# Patient Record
Sex: Female | Born: 1950 | Race: White | Hispanic: No | Smoking: Never smoker
Health system: Southern US, Community
[De-identification: ages and names within clinical notes are randomized; demographics above are authoritative.]

## PROBLEM LIST (undated history)

## (undated) DIAGNOSIS — M1990 Unspecified osteoarthritis, unspecified site: Secondary | ICD-10-CM

## (undated) DIAGNOSIS — R519 Headache, unspecified: Secondary | ICD-10-CM

## (undated) DIAGNOSIS — F32A Depression, unspecified: Secondary | ICD-10-CM

## (undated) DIAGNOSIS — R51 Headache: Secondary | ICD-10-CM

## (undated) DIAGNOSIS — F329 Major depressive disorder, single episode, unspecified: Secondary | ICD-10-CM

## (undated) HISTORY — PX: ANKLE FUSION: SHX881

## (undated) HISTORY — PX: KNEE ARTHROSCOPY: SUR90

## (undated) HISTORY — PX: TONSILLECTOMY: SUR1361

## (undated) HISTORY — PX: BOWEL RESECTION: SHX1257

## (undated) HISTORY — PX: KNEE SURGERY: SHX244

## (undated) HISTORY — PX: ABDOMINAL HYSTERECTOMY: SHX81

---

## 2005-10-13 ENCOUNTER — Encounter: Admission: RE | Admit: 2005-10-13 | Discharge: 2005-10-13 | Payer: Self-pay | Admitting: Obstetrics and Gynecology

## 2006-10-20 ENCOUNTER — Encounter: Admission: RE | Admit: 2006-10-20 | Discharge: 2006-10-20 | Payer: Self-pay | Admitting: Obstetrics and Gynecology

## 2008-01-31 ENCOUNTER — Encounter: Admission: RE | Admit: 2008-01-31 | Discharge: 2008-01-31 | Payer: Self-pay | Admitting: Obstetrics and Gynecology

## 2009-01-17 ENCOUNTER — Emergency Department (HOSPITAL_BASED_OUTPATIENT_CLINIC_OR_DEPARTMENT_OTHER): Admission: EM | Admit: 2009-01-17 | Discharge: 2009-01-17 | Payer: Self-pay | Admitting: Emergency Medicine

## 2009-01-17 ENCOUNTER — Ambulatory Visit: Payer: Self-pay | Admitting: Radiology

## 2009-03-26 ENCOUNTER — Ambulatory Visit: Payer: Self-pay | Admitting: Diagnostic Radiology

## 2009-03-26 ENCOUNTER — Ambulatory Visit (HOSPITAL_BASED_OUTPATIENT_CLINIC_OR_DEPARTMENT_OTHER): Admission: RE | Admit: 2009-03-26 | Discharge: 2009-03-26 | Payer: Self-pay | Admitting: Orthopedic Surgery

## 2009-04-24 ENCOUNTER — Ambulatory Visit (HOSPITAL_BASED_OUTPATIENT_CLINIC_OR_DEPARTMENT_OTHER): Admission: RE | Admit: 2009-04-24 | Discharge: 2009-04-24 | Payer: Self-pay | Admitting: Orthopedic Surgery

## 2009-05-05 ENCOUNTER — Encounter: Admission: RE | Admit: 2009-05-05 | Discharge: 2009-06-25 | Payer: Self-pay | Admitting: Orthopedic Surgery

## 2009-09-30 ENCOUNTER — Encounter: Admission: RE | Admit: 2009-09-30 | Discharge: 2009-09-30 | Payer: Self-pay | Admitting: Obstetrics and Gynecology

## 2009-12-06 ENCOUNTER — Ambulatory Visit: Payer: Self-pay | Admitting: Diagnostic Radiology

## 2009-12-06 ENCOUNTER — Ambulatory Visit (HOSPITAL_BASED_OUTPATIENT_CLINIC_OR_DEPARTMENT_OTHER): Admission: RE | Admit: 2009-12-06 | Discharge: 2009-12-06 | Payer: Self-pay | Admitting: Orthopaedic Surgery

## 2010-01-31 ENCOUNTER — Ambulatory Visit: Payer: Self-pay | Admitting: Diagnostic Radiology

## 2010-01-31 ENCOUNTER — Ambulatory Visit (HOSPITAL_BASED_OUTPATIENT_CLINIC_OR_DEPARTMENT_OTHER): Admission: RE | Admit: 2010-01-31 | Discharge: 2010-01-31 | Payer: Self-pay | Admitting: Orthopaedic Surgery

## 2010-02-16 ENCOUNTER — Encounter: Admission: RE | Admit: 2010-02-16 | Discharge: 2010-03-13 | Payer: Self-pay | Admitting: Orthopaedic Surgery

## 2010-03-04 ENCOUNTER — Encounter: Admission: RE | Admit: 2010-03-04 | Discharge: 2010-03-04 | Payer: Self-pay | Admitting: Orthopedic Surgery

## 2010-03-20 ENCOUNTER — Ambulatory Visit (HOSPITAL_BASED_OUTPATIENT_CLINIC_OR_DEPARTMENT_OTHER): Admission: RE | Admit: 2010-03-20 | Discharge: 2010-03-20 | Payer: Self-pay | Admitting: Orthopedic Surgery

## 2010-06-08 ENCOUNTER — Encounter
Admission: RE | Admit: 2010-06-08 | Discharge: 2010-07-02 | Payer: Self-pay | Source: Home / Self Care | Attending: Orthopedic Surgery | Admitting: Orthopedic Surgery

## 2010-07-07 ENCOUNTER — Encounter: Admission: RE | Admit: 2010-07-07 | Payer: Self-pay | Source: Home / Self Care | Admitting: Orthopedic Surgery

## 2010-07-09 ENCOUNTER — Encounter: Admit: 2010-07-09 | Payer: Self-pay | Admitting: Orthopedic Surgery

## 2010-09-14 ENCOUNTER — Other Ambulatory Visit: Payer: Self-pay | Admitting: Obstetrics and Gynecology

## 2010-09-17 LAB — POCT HEMOGLOBIN-HEMACUE: Hemoglobin: 13.1 g/dL (ref 12.0–15.0)

## 2010-10-11 LAB — POCT CARDIAC MARKERS: CKMB, poc: <1 ng/mL — ABNORMAL LOW (ref 1.0–8.0)

## 2010-12-27 ENCOUNTER — Emergency Department (INDEPENDENT_AMBULATORY_CARE_PROVIDER_SITE_OTHER): Payer: BC Managed Care – PPO

## 2010-12-27 ENCOUNTER — Emergency Department (HOSPITAL_BASED_OUTPATIENT_CLINIC_OR_DEPARTMENT_OTHER)
Admission: EM | Admit: 2010-12-27 | Discharge: 2010-12-27 | Disposition: A | Discharge status: Home / Self Care | Payer: BC Managed Care – PPO | Attending: Emergency Medicine | Admitting: Emergency Medicine

## 2010-12-27 DIAGNOSIS — R609 Edema, unspecified: Secondary | ICD-10-CM

## 2010-12-27 DIAGNOSIS — W19XXXA Unspecified fall, initial encounter: Secondary | ICD-10-CM | POA: Insufficient documentation

## 2010-12-27 DIAGNOSIS — M25579 Pain in unspecified ankle and joints of unspecified foot: Secondary | ICD-10-CM

## 2010-12-27 DIAGNOSIS — Y9289 Other specified places as the place of occurrence of the external cause: Secondary | ICD-10-CM | POA: Insufficient documentation

## 2010-12-27 DIAGNOSIS — S82899A Other fracture of unspecified lower leg, initial encounter for closed fracture: Secondary | ICD-10-CM | POA: Insufficient documentation

## 2011-02-12 ENCOUNTER — Emergency Department (HOSPITAL_BASED_OUTPATIENT_CLINIC_OR_DEPARTMENT_OTHER)
Admission: EM | Admit: 2011-02-12 | Discharge: 2011-02-12 | Disposition: A | Discharge status: Home / Self Care | Payer: BC Managed Care – PPO | Attending: Emergency Medicine | Admitting: Emergency Medicine

## 2011-02-12 DIAGNOSIS — L989 Disorder of the skin and subcutaneous tissue, unspecified: Secondary | ICD-10-CM

## 2011-02-12 DIAGNOSIS — R51 Headache: Secondary | ICD-10-CM | POA: Insufficient documentation

## 2011-02-12 MED ORDER — SULFAMETHOXAZOLE-TRIMETHOPRIM 800-160 MG PO TABS: 1.0000 | ORAL_TABLET | Freq: Two times a day (BID) | ORAL | Status: AC

## 2011-02-12 MED ORDER — HYDROCODONE-ACETAMINOPHEN 7.5-325 MG PO TABS: 1.0000 | ORAL_TABLET | Freq: Four times a day (QID) | ORAL | Status: AC | PRN

## 2011-02-12 NOTE — ED Provider Notes (Signed)
History     CSN: [id number] Arrival date & time: 02/12/2011  1:53 PM  Chief Complaint  Patient presents with  . Headache    right forehead pain with swelling and knot on head- tender to touch   HPI Comments: Pt has noted a "bump" to the right frontal scalp area. She does not recall an insect bite, or bumping into any thing. She is frighten because the area is painful and her husband has been diagnosed with leukemia. She request evaluation.  Patient is a 60 y.o. female presenting with headaches. The history is provided by the patient.  Headache  This is a new problem. The current episode started more than 2 days ago. The problem occurs constantly. The problem has not changed since onset.Associated with: Pain at the site. The pain is located in the frontal region. The quality of the pain is described as sharp. The pain is moderate. The pain does not radiate. Pertinent negatives include no anorexia, no fever, no malaise/fatigue, no near-syncope, no palpitations, no syncope, no shortness of breath, no nausea and no vomiting. She has tried nothing for the symptoms.    History reviewed. No pertinent past medical history.  Past Surgical History  Procedure Date  . Abdominal hysterectomy   . Bowel resection   . Tonsillectomy   . Ankle fusion     History reviewed. No pertinent family history.  History  Substance Use Topics  . Smoking status: Never Smoker   . Smokeless tobacco: Not on file  . Alcohol Use: 0.6 oz/week    1 Glasses of wine per week     once week    OB History    Grav Para Term Preterm Abortions TAB SAB Ect Mult Living                  Review of Systems  Constitutional: Negative for fever, malaise/fatigue and activity change.       All ROS Neg except as noted in HPI  HENT: Negative for nosebleeds and neck pain.   Eyes: Negative for photophobia and discharge.  Respiratory: Negative for cough, shortness of breath and wheezing.   Cardiovascular: Negative for chest  pain, palpitations, syncope and near-syncope.  Gastrointestinal: Negative for nausea, vomiting, abdominal pain, blood in stool and anorexia.  Genitourinary: Negative for dysuria, frequency and hematuria.  Musculoskeletal: Negative for back pain and arthralgias.  Skin: Negative.   Neurological: Positive for headaches. Negative for dizziness, seizures and speech difficulty.  Psychiatric/Behavioral: Negative for hallucinations and confusion.    Physical Exam  BP 121/72  Pulse 86  Temp(Src) 98.9 F (37.2 C) (Oral)  Resp 16  SpO2 100%  Physical Exam  Nursing note and vitals reviewed. Constitutional: She is oriented to person, place, and time. She appears well-developed and well-nourished.  Non-toxic appearance.  HENT:  Head: Normocephalic.  Right Ear: Tympanic membrane and external ear normal.  Left Ear: Tympanic membrane and external ear normal.       1.2cm lesion that is mildly raised and tender to palpation. No red streaks. Not hot.   Eyes: EOM and lids are normal. Pupils are equal, round, and reactive to light.  Neck: Normal range of motion. Neck supple. Carotid bruit is not present.  Cardiovascular: Normal rate, regular rhythm, normal heart sounds, intact distal pulses and normal pulses.   Pulmonary/Chest: Breath sounds normal. No respiratory distress.  Abdominal: Soft. Bowel sounds are normal. There is no tenderness. There is no guarding.  Musculoskeletal: Normal range of motion.  Lymphadenopathy:  Head (right side): No submandibular adenopathy present.       Head (left side): No submandibular adenopathy present.    She has no cervical adenopathy.  Neurological: She is alert and oriented to person, place, and time. She has normal strength. No cranial nerve deficit or sensory deficit. Coordination normal.  Skin: Skin is warm and dry.  Psychiatric: She has a normal mood and affect. Her speech is normal.    ED Course  Procedures  MDM I have reviewed nursing notes, vital  signs, and all appropriate lab and imaging results for this patient.      Kathie Dike, [place] 02/12/11 [phone]

## 2011-02-12 NOTE — ED Notes (Signed)
No visual problems; headache radiates down to right eye; no syncope or trauma to head

## 2011-02-14 NOTE — ED Provider Notes (Signed)
Medical screening examination/treatment/procedure(s) were performed by non-physician practitioner and as supervising physician I was immediately available for consultation/collaboration.   Nelia Shi, MD 02/14/11 2200

## 2011-04-05 ENCOUNTER — Other Ambulatory Visit: Payer: Self-pay | Admitting: Orthopedic Surgery

## 2011-04-05 DIAGNOSIS — M25512 Pain in left shoulder: Secondary | ICD-10-CM

## 2011-04-09 ENCOUNTER — Ambulatory Visit
Admission: RE | Admit: 2011-04-09 | Discharge: 2011-04-09 | Disposition: A | Discharge status: Home / Self Care | Payer: BC Managed Care – PPO | Source: Ambulatory Visit | Attending: Orthopedic Surgery | Admitting: Orthopedic Surgery

## 2011-04-09 DIAGNOSIS — M25512 Pain in left shoulder: Secondary | ICD-10-CM

## 2011-04-26 ENCOUNTER — Ambulatory Visit: Payer: BC Managed Care – PPO | Attending: Orthopedic Surgery | Admitting: Physical Therapy

## 2011-04-26 DIAGNOSIS — M25519 Pain in unspecified shoulder: Secondary | ICD-10-CM | POA: Insufficient documentation

## 2011-04-26 DIAGNOSIS — M25619 Stiffness of unspecified shoulder, not elsewhere classified: Secondary | ICD-10-CM | POA: Insufficient documentation

## 2011-04-26 DIAGNOSIS — IMO0001 Reserved for inherently not codable concepts without codable children: Secondary | ICD-10-CM | POA: Insufficient documentation

## 2011-04-29 ENCOUNTER — Ambulatory Visit: Payer: BC Managed Care – PPO | Admitting: Physical Therapy

## 2011-05-04 ENCOUNTER — Ambulatory Visit: Payer: BC Managed Care – PPO | Admitting: Physical Therapy

## 2011-05-06 ENCOUNTER — Ambulatory Visit: Payer: BC Managed Care – PPO | Attending: Orthopedic Surgery | Admitting: Physical Therapy

## 2011-05-06 DIAGNOSIS — IMO0001 Reserved for inherently not codable concepts without codable children: Secondary | ICD-10-CM | POA: Insufficient documentation

## 2011-05-06 DIAGNOSIS — M25619 Stiffness of unspecified shoulder, not elsewhere classified: Secondary | ICD-10-CM | POA: Insufficient documentation

## 2011-05-06 DIAGNOSIS — M25519 Pain in unspecified shoulder: Secondary | ICD-10-CM | POA: Insufficient documentation

## 2011-05-11 ENCOUNTER — Ambulatory Visit: Payer: BC Managed Care – PPO | Admitting: Physical Therapy

## 2011-06-01 ENCOUNTER — Ambulatory Visit: Payer: BC Managed Care – PPO | Admitting: Physical Therapy

## 2011-06-03 ENCOUNTER — Ambulatory Visit: Payer: BC Managed Care – PPO | Admitting: Physical Therapy

## 2011-06-08 ENCOUNTER — Ambulatory Visit: Payer: BC Managed Care – PPO | Attending: Orthopedic Surgery | Admitting: Physical Therapy

## 2011-06-08 DIAGNOSIS — IMO0001 Reserved for inherently not codable concepts without codable children: Secondary | ICD-10-CM | POA: Insufficient documentation

## 2011-06-08 DIAGNOSIS — M25619 Stiffness of unspecified shoulder, not elsewhere classified: Secondary | ICD-10-CM | POA: Insufficient documentation

## 2011-06-08 DIAGNOSIS — M25519 Pain in unspecified shoulder: Secondary | ICD-10-CM | POA: Insufficient documentation

## 2011-06-10 ENCOUNTER — Ambulatory Visit: Payer: BC Managed Care – PPO | Admitting: Physical Therapy

## 2011-06-15 ENCOUNTER — Ambulatory Visit: Payer: BC Managed Care – PPO | Admitting: Physical Therapy

## 2011-06-22 ENCOUNTER — Ambulatory Visit: Payer: BC Managed Care – PPO | Admitting: Physical Therapy

## 2011-07-21 ENCOUNTER — Emergency Department (INDEPENDENT_AMBULATORY_CARE_PROVIDER_SITE_OTHER): Payer: BC Managed Care – PPO

## 2011-07-21 ENCOUNTER — Encounter (HOSPITAL_BASED_OUTPATIENT_CLINIC_OR_DEPARTMENT_OTHER): Payer: Self-pay | Admitting: *Deleted

## 2011-07-21 ENCOUNTER — Emergency Department (HOSPITAL_BASED_OUTPATIENT_CLINIC_OR_DEPARTMENT_OTHER)
Admission: EM | Admit: 2011-07-21 | Discharge: 2011-07-21 | Disposition: A | Discharge status: Home / Self Care | Payer: BC Managed Care – PPO | Attending: Emergency Medicine | Admitting: Emergency Medicine

## 2011-07-21 ENCOUNTER — Other Ambulatory Visit: Payer: Self-pay

## 2011-07-21 DIAGNOSIS — R42 Dizziness and giddiness: Secondary | ICD-10-CM | POA: Insufficient documentation

## 2011-07-21 LAB — COMPREHENSIVE METABOLIC PANEL
ALT: 16 U/L (ref 0–35)
AST: 21 U/L (ref 0–37)
Albumin: 4.1 g/dL (ref 3.5–5.2)
Alkaline Phosphatase: 90 U/L (ref 39–117)
BUN: 17 mg/dL (ref 6–23)
CO2: 25 mEq/L (ref 19–32)
Calcium: 9.7 mg/dL (ref 8.4–10.5)
Chloride: 103 mEq/L (ref 96–112)
Creatinine, Ser: 0.8 mg/dL (ref 0.50–1.10)
GFR calc Af Amer: >90 mL/min (ref >90)
GFR calc non Af Amer: 79 mL/min — ABNORMAL LOW (ref >90)
Glucose, Bld: 114 mg/dL — ABNORMAL HIGH (ref 70–99)
Potassium: 4.4 mEq/L (ref 3.5–5.1)
Sodium: 139 mEq/L (ref 135–145)
Total Bilirubin: 0.2 mg/dL — ABNORMAL LOW (ref 0.3–1.2)
Total Protein: 7.2 g/dL (ref 6.0–8.3)

## 2011-07-21 LAB — PROTIME-INR
INR: 0.91 (ref 0.00–1.49)
Prothrombin Time: 12.5 seconds (ref 11.6–15.2)

## 2011-07-21 LAB — CBC
HCT: 40.8 % (ref 36.0–46.0)
Hemoglobin: 13.8 g/dL (ref 12.0–15.0)
MCH: 30.5 pg (ref 26.0–34.0)
MCHC: 33.8 g/dL (ref 30.0–36.0)
MCV: 90.1 fL (ref 78.0–100.0)
Platelets: 252 10*3/uL (ref 150–400)
RBC: 4.53 MIL/uL (ref 3.87–5.11)
RDW: 13 % (ref 11.5–15.5)
WBC: 8.3 10*3/uL (ref 4.0–10.5)

## 2011-07-21 LAB — URINALYSIS, ROUTINE W REFLEX MICROSCOPIC
Bilirubin Urine: NEGATIVE
Glucose, UA: NEGATIVE mg/dL
Protein, ur: NEGATIVE mg/dL

## 2011-07-21 LAB — DIFFERENTIAL
Basophils Absolute: 0 10*3/uL (ref 0.0–0.1)
Basophils Relative: 0 % (ref 0–1)
Monocytes Relative: 9 % (ref 3–12)
Neutro Abs: 4.8 10*3/uL (ref 1.7–7.7)
Neutrophils Relative %: 59 % (ref 43–77)

## 2011-07-21 LAB — CARDIAC PANEL(CRET KIN+CKTOT+MB+TROPI)
CK, MB: 1.7 ng/mL (ref 0.3–4.0)
Relative Index: INVALID (ref 0.0–2.5)
Total CK: 73 U/L (ref 7–177)
Troponin I: <0.3 ng/mL (ref <0.30)

## 2011-07-21 LAB — URINE MICROSCOPIC-ADD ON

## 2011-07-21 MED ORDER — MECLIZINE HCL 50 MG PO TABS: 25.0000 mg | ORAL_TABLET | Freq: Three times a day (TID) | ORAL | Status: AC | PRN

## 2011-07-21 MED ORDER — MECLIZINE HCL 25 MG PO TABS
25.0000 mg | ORAL_TABLET | Freq: Once | ORAL | Status: AC
  Administered 2011-07-21: 25 mg via ORAL
  Filled 2011-07-21: qty 1

## 2011-07-21 NOTE — ED Provider Notes (Signed)
History     CSN: [id number]  Arrival date & time 07/21/11  1613   None     Chief Complaint  Patient presents with  . Dizziness    (Consider location/radiation/quality/duration/timing/severity/associated sxs/prior treatment) HPI Comments: Patient is a 61 year old woman who says she's real dizzy. She says this is the third episode she's had like this in a week. She describes resting her head back on the back of her chair. When she straightened up she felt disoriented and became dizzy, feeling as though she would fall if she walks. Things seem to go round and round. There is no vomiting. She does better she holds still. There is no history of injury. Up until this week she had a similar symptom.  Patient is a 61 y.o. female presenting with neurologic complaint.  Neurologic Problem The primary symptoms include dizziness. Primary symptoms do not include fever. The symptoms began 3 to 5 days ago. Episode duration: Episodes last several minutes and are worse with change of position. The symptoms are unchanged. Context: This episode happened while at work. She notes that she is under considerable stress at work.  Procedure history comments: She's had no prior medical evaluation of her condition.Marland Kitchen    History reviewed. No pertinent past medical history.  Past Surgical History  Procedure Date  . Abdominal hysterectomy   . Bowel resection   . Tonsillectomy   . Ankle fusion     History reviewed. No pertinent family history.  History  Substance Use Topics  . Smoking status: Never Smoker   . Smokeless tobacco: Not on file  . Alcohol Use: 0.6 oz/week    1 Glasses of wine per week     once week    OB History    Grav Para Term Preterm Abortions TAB SAB Ect Mult Living                  Review of Systems  Constitutional: Negative.  Negative for fever and chills.  HENT: Negative.   Eyes: Negative.   Respiratory: Negative.   Cardiovascular: Negative.   Gastrointestinal: Negative.     Genitourinary: Negative.   Musculoskeletal: Negative.   Skin: Negative.   Neurological: Positive for dizziness.  Psychiatric/Behavioral: Negative.     Allergies  Penicillins; Codeine; and Darvon  Home Medications   Current Outpatient Rx  Name Route Sig Dispense Refill  . NAPROXEN SODIUM 220 MG PO TABS Oral Take 220 mg by mouth 2 (two) times daily with a meal.        BP 126/80  Pulse 85  Temp(Src) 98 F (36.7 C) (Oral)  Resp 18  Ht 5\' 6"  (1.676 m)  Wt 210 lb (95.255 kg)  BMI 33.89 kg/m2  SpO2 100%  Physical Exam  Constitutional: She is oriented to person, place, and time.       Obese woman in no distress at rest. Sitting her up accentuated her feeling of dizziness.  HENT:  Head: Normocephalic and atraumatic.  Right Ear: External ear normal.  Left Ear: External ear normal.  Nose: Nose normal.  Mouth/Throat: Oropharynx is clear and moist.       No nystagmus on lateral gaze to either side.  Eyes: Conjunctivae and EOM are normal. Pupils are equal, round, and reactive to light.  Neck: Normal range of motion. Neck supple.       No carotid bruits.  Cardiovascular: Normal rate, regular rhythm and normal heart sounds.   Pulmonary/Chest: Effort normal and breath sounds normal.  Abdominal: Soft.  Bowel sounds are normal.  Musculoskeletal: Normal range of motion.  Neurological: She is alert and oriented to person, place, and time.       No sensory or motor deficit.  Skin: Skin is warm and dry.  Psychiatric: She has a normal mood and affect. Her behavior is normal.    ED Course  Procedures (including critical care time)   Labs Reviewed  CARDIAC PANEL(CRET KIN+CKTOT+MB+TROPI)  CBC  COMPREHENSIVE METABOLIC PANEL   No results found. 4:29 PM  Date: 07/21/2011  Rate:77  Rhythm: normal sinus rhythm  QRS Axis: normal  Intervals: normal  ST/T Wave abnormalities: normal  Conduction Disutrbances:none  Narrative Interpretation: Normal EKG  Old EKG Reviewed:  unchanged  5:08 PM Patient was seen and had physical exam. Laboratory tests were ordered. EKG and head CT were ordered. Antivert 25 mg by mouth was ordered.   7:30 PM Lab workup was reassuringly negative. Patient is starting to feel better. I advised her to take Antivert 25 mg 4 times a day for 7 days for dizziness no work for 3 days. She can follow up with Raechel Chute, M.D.  upstairs from [facility] ED.   1. Vertigo           Carleene Cooper III, MD 07/21/11 [phone]

## 2011-07-21 NOTE — ED Notes (Signed)
Pt to room 2 by ems via stretcher, pt is a/a/ox4, reports dizzy spells x 3 days, today stood up at work and felt dizzy so called 911. Pt states she has been under a lot of stress at work lately, and feels this is related. Pt denies any cp, sob or any other c/o.

## 2013-02-07 ENCOUNTER — Other Ambulatory Visit: Payer: Self-pay | Admitting: Orthopedic Surgery

## 2013-02-07 DIAGNOSIS — M25572 Pain in left ankle and joints of left foot: Secondary | ICD-10-CM

## 2013-02-08 ENCOUNTER — Ambulatory Visit
Admission: RE | Admit: 2013-02-08 | Discharge: 2013-02-08 | Disposition: A | Discharge status: Home / Self Care | Payer: BC Managed Care – PPO | Source: Ambulatory Visit | Attending: Orthopedic Surgery | Admitting: Orthopedic Surgery

## 2013-02-08 DIAGNOSIS — M25572 Pain in left ankle and joints of left foot: Secondary | ICD-10-CM

## 2013-08-11 ENCOUNTER — Encounter (HOSPITAL_BASED_OUTPATIENT_CLINIC_OR_DEPARTMENT_OTHER): Payer: Self-pay | Admitting: Emergency Medicine

## 2013-08-11 ENCOUNTER — Emergency Department (HOSPITAL_BASED_OUTPATIENT_CLINIC_OR_DEPARTMENT_OTHER): Payer: BC Managed Care – PPO

## 2013-08-11 ENCOUNTER — Emergency Department (HOSPITAL_BASED_OUTPATIENT_CLINIC_OR_DEPARTMENT_OTHER)
Admission: EM | Admit: 2013-08-11 | Discharge: 2013-08-11 | Disposition: A | Discharge status: Home / Self Care | Payer: BC Managed Care – PPO | Attending: Emergency Medicine | Admitting: Emergency Medicine

## 2013-08-11 DIAGNOSIS — X500XXA Overexertion from strenuous movement or load, initial encounter: Secondary | ICD-10-CM | POA: Insufficient documentation

## 2013-08-11 DIAGNOSIS — M67919 Unspecified disorder of synovium and tendon, unspecified shoulder: Secondary | ICD-10-CM | POA: Insufficient documentation

## 2013-08-11 DIAGNOSIS — S46909A Unspecified injury of unspecified muscle, fascia and tendon at shoulder and upper arm level, unspecified arm, initial encounter: Secondary | ICD-10-CM | POA: Insufficient documentation

## 2013-08-11 DIAGNOSIS — M719 Bursopathy, unspecified: Secondary | ICD-10-CM | POA: Insufficient documentation

## 2013-08-11 DIAGNOSIS — Y9389 Activity, other specified: Secondary | ICD-10-CM | POA: Insufficient documentation

## 2013-08-11 DIAGNOSIS — Z880 Allergy status to penicillin: Secondary | ICD-10-CM | POA: Insufficient documentation

## 2013-08-11 DIAGNOSIS — Z79899 Other long term (current) drug therapy: Secondary | ICD-10-CM | POA: Insufficient documentation

## 2013-08-11 DIAGNOSIS — M25511 Pain in right shoulder: Secondary | ICD-10-CM

## 2013-08-11 DIAGNOSIS — S4980XA Other specified injuries of shoulder and upper arm, unspecified arm, initial encounter: Secondary | ICD-10-CM | POA: Insufficient documentation

## 2013-08-11 DIAGNOSIS — M7580 Other shoulder lesions, unspecified shoulder: Secondary | ICD-10-CM

## 2013-08-11 DIAGNOSIS — Y929 Unspecified place or not applicable: Secondary | ICD-10-CM | POA: Insufficient documentation

## 2013-08-11 MED ORDER — HYDROCODONE-ACETAMINOPHEN 5-325 MG PO TABS: 2.0000 | ORAL_TABLET | ORAL | Status: DC | PRN

## 2013-08-11 NOTE — ED Provider Notes (Signed)
CSN: [id number]     Arrival date & time 08/11/13  1028 History   First MD Initiated Contact with Patient 08/11/13 1048     Chief Complaint  Patient presents with  . Shoulder Pain   (Consider location/radiation/quality/duration/timing/severity/associated sxs/prior Treatment) HPI Comments: Patient is a 63 year old female who presents with complaints of right shoulder pain. This started approximately 6 weeks ago while she was reaching for objects. She felt a pop in her shoulder and it has been uncomfortable since. There is no numbness or tingling. Her pain is worsened when she tends to reach behind her or put her hands in her pockets.  Patient is a 63 y.o. female presenting with shoulder pain. The history is provided by the patient.  Shoulder Pain This is a new problem. Episode onset: 6 weeks ago. The problem occurs constantly. The problem has been gradually worsening. Exacerbated by: Movement and palpation. Nothing relieves the symptoms. She has tried nothing for the symptoms. The treatment provided no relief.    History reviewed. No pertinent past medical history. Past Surgical History  Procedure Laterality Date  . Abdominal hysterectomy    . Bowel resection    . Tonsillectomy    . Ankle fusion    . Knee surgery Bilateral    No family history on file. History  Substance Use Topics  . Smoking status: Never Smoker   . Smokeless tobacco: Not on file  . Alcohol Use: 0.6 oz/week    1 Glasses of wine per week     Comment: once week   OB History   Grav Para Term Preterm Abortions TAB SAB Ect Mult Living                 Review of Systems  All other systems reviewed and are negative.    Allergies  Penicillins; Codeine; and Darvon  Home Medications   Current Outpatient Rx  Name  Route  Sig  Dispense  Refill  . magnesium oxide (MAG-OX) 400 MG tablet   Oral   Take 400 mg by mouth daily.         Marland Kitchen. OVER THE COUNTER MEDICATION   Oral   Take 1 capsule by mouth daily.  Safferon          BP 150/95  Pulse 76  Temp(Src) 97.7 F (36.5 C)  Resp 18  SpO2 100% Physical Exam  Nursing note and vitals reviewed. Constitutional: She is oriented to person, place, and time. She appears well-developed and well-nourished. No distress.  HENT:  Head: Normocephalic and atraumatic.  Neck: Normal range of motion. Neck supple.  Musculoskeletal:  Right shoulder appears grossly normal. There is tenderness to palpation over the posterior aspect. The ulnar and radial pulses are easily palpable and motor and sensation are intact in the hand. There is pain with abduction.  Neurological: She is alert and oriented to person, place, and time.  Skin: Skin is warm and dry. She is not diaphoretic.    ED Course  Procedures (including critical care time) Labs Review Labs Reviewed - No data to display Imaging Review No results found.    MDM  No diagnosis found. Patient presents with complaints of shoulder pain. Her x-rays are negative and I suspect this may be a rotator cuff tendinitis. She will be placed in an arm sling and advised to, take anti-inflammatories, and followup with her Dr. if not improving in the next week to discuss physical therapy or further imaging.    Geoffery Lyonsouglas Raeanne Deschler, MD 08/11/13 1124

## 2013-08-11 NOTE — Discharge Instructions (Signed)
Ibuprofen 800 mg 3 times daily for the next 5 days.  Hydrocodone as needed for pain not relieved with ibuprofen.  Wear sling for the next week and rest.  If not improving in the next week, followup with your primary Dr. to discuss physical therapy or further imaging.   Rotator Cuff Tendinitis  Rotator cuff tendinitis is inflammation of the tough, cord-like bands that connect muscle to bone (tendons) in your rotator cuff. Your rotator cuff is the collection of all the muscles and tendons that connect your arm to your shoulder. Your rotator cuff holds the head of your upper arm bone (humerus) in the cup (fossa) of your shoulder blade (scapula). CAUSES Rotator cuff tendinitis is usually caused by overusing the joint involved.  SIGNS AND SYMPTOMS  Deep ache in the shoulder also felt on the outside upper arm over the shoulder muscle.  Point tenderness over the area that is injured.  Pain comes on gradually and becomes worse with lifting the arm to the side (abduction) or turning it inward (internal rotation).  May lead to a chronic tear: When a rotator cuff tendon becomes inflamed, it runs the risk of losing its blood supply, causing some tendon fibers to die. This increases the risk that the tendon can fray and partially or completely tear. DIAGNOSIS Rotator cuff tendinitis is diagnosed by taking a medical history, performing a physical exam, and reviewing results of imaging exams. The medical history is useful to help determine the type of rotator cuff injury. The physical exam will include looking at the injured shoulder, feeling the injured area, and watching you do range-of-motion exercises. X-ray exams are typically done to rule out other causes of shoulder pain, such as fractures. MRI is the imaging exam usually used for significant shoulder injuries. Sometimes a dye study called CT arthrogram is done, but it is not as widely used as MRI. In some institutions, special ultrasound tests may  also be used to aid in the diagnosis. TREATMENT  Less Severe Cases  Use of a sling to rest the shoulder for a short period of time. Prolonged use of the sling can cause stiffness, weakness, and loss of motion of the shoulder joint.  Anti-inflammatory medicines, such as ibuprofen or naproxen sodium, may be prescribed. More Severe Cases  Physical therapy.  Use of steroid injections into the shoulder joint.  Surgery. HOME CARE INSTRUCTIONS   Use a sling or splint until the pain decreases. Prolonged use of the sling can cause stiffness, weakness, and loss of motion of the shoulder joint.  Apply ice to the injured area:  Put ice in a plastic bag.  Place a towel between your skin and the bag.  Leave the ice on for 20 minutes, 2 3 times a day.  Try to avoid use other than gentle range of motion while your shoulder is painful. Use the shoulder and exercise only as directed by your health care provider. Stop exercises or range of motion if pain or discomfort increases, unless directed otherwise by your health care provider.  Only take over-the-counter or prescription medicines for pain, discomfort, or fever as directed by your health care provider.  If you were given a shoulder sling and straps (immobilizer), do not remove it except as directed, or until you see a health care provider for a follow-up exam. If you need to remove it, move your arm as little as possible or as directed.  You may want to sleep on several pillows at night to lessen swelling  and pain. SEEK IMMEDIATE MEDICAL CARE IF:   Your shoulder pain increases or new pain develops in your arm, hand, or fingers and is not relieved with medicines.  You have new, unexplained symptoms, especially increased numbness in the hands or loss of strength.  You develop any worsening of the problems that brought you in for care.  Your arm, hand, or fingers are numb or tingling.  Your arm, hand, or fingers are swollen, painful, or  turn white or blue. MAKE SURE YOU:  Understand these instructions.  Will watch your condition.  Will get help right away if you are not doing well or get worse. Document Released: 09/11/2003 Document Revised: 04/11/2013 Document Reviewed: 01/31/2013 [facility] Patient Information 2014 [place], [place].

## 2013-08-11 NOTE — ED Notes (Signed)
Patient states she injured her R shoulder approx one month ago, took ibuprofen, but pain has grown worse

## 2014-09-23 ENCOUNTER — Other Ambulatory Visit (HOSPITAL_COMMUNITY): Payer: Self-pay | Admitting: Orthopaedic Surgery

## 2014-09-23 ENCOUNTER — Ambulatory Visit (HOSPITAL_COMMUNITY)
Admission: RE | Admit: 2014-09-23 | Discharge: 2014-09-23 | Disposition: A | Discharge status: Home / Self Care | Payer: BLUE CROSS/BLUE SHIELD | Source: Ambulatory Visit | Attending: Cardiology | Admitting: Cardiology

## 2014-09-23 DIAGNOSIS — M79662 Pain in left lower leg: Secondary | ICD-10-CM | POA: Diagnosis not present

## 2014-09-23 DIAGNOSIS — M7989 Other specified soft tissue disorders: Secondary | ICD-10-CM | POA: Insufficient documentation

## 2014-09-23 NOTE — Progress Notes (Signed)
Left Lower Ext Venous Completed. Negative for DVT or SVT. Eriq Hufford, BS, RDMS, RVT  

## 2014-09-27 ENCOUNTER — Telehealth (HOSPITAL_COMMUNITY): Payer: Self-pay | Admitting: *Deleted

## 2015-01-09 ENCOUNTER — Encounter: Payer: Self-pay | Admitting: Rehabilitation

## 2015-01-09 ENCOUNTER — Ambulatory Visit: Payer: BLUE CROSS/BLUE SHIELD | Attending: Orthopaedic Surgery | Admitting: Rehabilitation

## 2015-01-09 DIAGNOSIS — M25561 Pain in right knee: Secondary | ICD-10-CM

## 2015-01-09 DIAGNOSIS — R262 Difficulty in walking, not elsewhere classified: Secondary | ICD-10-CM | POA: Diagnosis present

## 2015-01-09 DIAGNOSIS — M25562 Pain in left knee: Secondary | ICD-10-CM | POA: Insufficient documentation

## 2015-01-09 NOTE — Therapy (Addendum)
[facility] [address]  [place], [place], [address] Phone: [phone]   Fax:  [phone]  Physical Therapy Evaluation  Patient Details  Name: Lauren Frye MRN: [id number] Date of Birth: 1950-12-28 Referring Provider:  Garald Balding, MD  Encounter Date: 01/09/2015      PT End of Session - 01/09/15 1711    Visit Number 1   Number of Visits 4   Date for PT Re-Evaluation 02/06/15   PT Start Time 3013   PT Stop Time 1700   PT Time Calculation (min) 45 min   Activity Tolerance Patient tolerated treatment well      History reviewed. No pertinent past medical history.  Past Surgical History  Procedure Laterality Date  . Abdominal hysterectomy    . Bowel resection    . Tonsillectomy    . Ankle fusion    . Knee surgery Bilateral     There were no vitals filed for this visit.  Visit Diagnosis:  Knee pain, bilateral - Plan: PT plan of care cert/re-cert  Difficulty walking - Plan: PT plan of care cert/re-cert      Subjective Assessment - 01/09/15 1612    Subjective Presents s/p bilateral knee scope due to lateral meniscal tears due to degeneration. No therapy post operatively.  The pain and difficulties never went away in the L knee.  The Right knee felt better but has now flared up again.  Has had 1 cortisone injection which did not help.  Pain bilaterally now medially and most likely more OA related.  Getting SimVisc injection July 25th in just the L leg. Eventual knee replacement on both knees.   MD wants her to increase her quad strength but reports even the bicycle is very painful.  Reports unable to go down the stairs.  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    Limitations Standing;Walking   Patient Stated Goals improve LE/quad strength, improve stairs, getting into and out of the car, hold off/prepare for knee replacement   Currently in Pain? Yes   Pain Score 4   up to 10/10 everyday   Pain  Location Knee  medial   Pain Orientation Left   Pain Type Chronic pain   Pain Frequency Constant   Aggravating Factors  walking, stairs, sit to stand   Pain Relieving Factors medication, rest, ice   Multiple Pain Sites Yes   Pain Score 2  up to 6-7/10   Pain Location Knee   Pain Orientation Right  medial   Aggravating Factors  walking, stairs, sit to stand   Pain Relieving Factors rest, medication            [facility] PT Assessment - 01/09/15 0001    Assessment   Medical Diagnosis L meniscal repair   Onset Date/Surgical Date 09/12/14   Next MD Visit July 24   Prior Therapy no   Precautions   Precautions None   Restrictions   Weight Bearing Restrictions No   Balance Screen   Has the patient fallen in the past 6 months No   [place] residence   Prior Function   Level of Independence Independent   Vocation Full time employment   Vocation Requirements desk   Observation/Other Assessments   Focus on Therapeutic Outcomes (FOTO)  54% limited   ROM / Strength   AROM / PROM / Strength AROM;PROM;Strength   AROM   AROM Assessment Site Knee  Right/Left Knee Right;Left   Right Knee Extension 0   Right Knee Flexion 105  pn   Left Knee Extension 0  pain with lowering from LAQ    Left Knee Flexion 115  pain   PROM   PROM Assessment Site Knee   Right/Left Knee Right;Left   Strength   Strength Assessment Site Hip;Knee   Right/Left Hip Right;Left   Right Hip Flexion 4+/5   Right Hip External Rotation  --  4+/5   Right Hip Internal Rotation  --  4+/5   Right Hip ABduction 4/5   Right Hip ADduction 4+/5   Left Hip Flexion 4+/5   Left Hip External Rotation  --  4+/5   Left Hip Internal Rotation  --  4+/5   Left Hip ABduction 4/5   Left Hip ADduction 4+/5   Right/Left Knee Right;Left   Right Knee Flexion 4/5   Right Knee Extension 4+/5   Left Knee Flexion 4/5   Left Knee Extension 4+/5  pain with clicking with unlocking    Palpation   Patella mobility WNL   Ambulation/Gait   Gait Comments antalgic pattern L>R      Eval performed HEP given per pt instruction section with green band and instruction to buy 2-3# leg weight Pt will attempt HEP x 1 week                     PT Education - 01/09/15 1710    Education provided Yes   Education Details HEP, POC, aquatic therapy benefits   Person(s) Educated Patient   Methods Explanation;Handout   Comprehension Verbalized understanding;Returned demonstration             PT Long Term Goals - 01/09/15 1716    PT LONG TERM GOAL #1   Title Pt will be independent with HEP for continued LE/quad endurance and conditioning to prolong time to TKR   Time 4   Period Weeks   Status New               Plan - 01/09/15 1712    Clinical Impression Statement pt presents with bilateral knee OA/knee pain with history of 2 debridements of the R knee and 3 of the left.  TKR imminent but wanting to hold off as long as possible.  pt interested in learning HEP, self care.  Overall painful ROM and hip/knee strength good on testing in clinic. Does have painful clicking with unlocking of TKE L.R.  More difficulties with quad inhibition when knee is very painful.  Discussed benefits of looking into aquatic therapy.     Pt will benefit from skilled therapeutic intervention in order to improve on the following deficits Abnormal gait;Decreased activity tolerance;Decreased range of motion;Difficulty walking   Rehab Potential Good   PT Frequency 1x / week   PT Duration 4 weeks   PT Treatment/Interventions Therapeutic exercise;Neuromuscular re-education;Patient/family education;Cryotherapy   PT Next Visit Plan Review HEP; any changes? pt to discuss in clinic sessions vs home independence.     Does not ever want ESTIM   Consulted and Agree with Plan of Care Patient         Problem List There are no active problems to display for this patient.   Stark Bray, DPT, CMP 01/09/2015, 5:20 PM  [facility] [address]  [place], [place], [address] Phone: [phone]   Fax:  [phone]    PHYSICAL THERAPY DISCHARGE SUMMARY  Visits from Start of Care: 1  Current functional level related to goals / functional outcomes:  Refer to above evaluation as patient did not return for any further visits.   Remaining deficits:  See above   Education / Equipment:  Initial HEP provided.  Plan: Patient agrees to discharge.  Patient goals were not met. Patient is being discharged due to not returning since the last visit.  ?????        Percival Spanish, PT, MPT 01/24/2015, 8:19 AM  [facility] [address]  [place], [place], [address] Phone: [phone]   Fax:  [phone]

## 2015-01-20 ENCOUNTER — Ambulatory Visit: Payer: BLUE CROSS/BLUE SHIELD | Admitting: Physical Therapy

## 2015-09-24 ENCOUNTER — Other Ambulatory Visit (HOSPITAL_COMMUNITY): Payer: Self-pay | Admitting: *Deleted

## 2015-09-24 ENCOUNTER — Encounter (HOSPITAL_COMMUNITY)
Admission: RE | Admit: 2015-09-24 | Discharge: 2015-09-24 | Disposition: A | Discharge status: Home / Self Care | Payer: BLUE CROSS/BLUE SHIELD | Source: Ambulatory Visit | Attending: Orthopaedic Surgery | Admitting: Orthopaedic Surgery

## 2015-09-24 ENCOUNTER — Ambulatory Visit (HOSPITAL_COMMUNITY)
Admission: RE | Admit: 2015-09-24 | Discharge: 2015-09-24 | Disposition: A | Discharge status: Home / Self Care | Payer: BLUE CROSS/BLUE SHIELD | Source: Ambulatory Visit | Attending: Orthopedic Surgery | Admitting: Orthopedic Surgery

## 2015-09-24 ENCOUNTER — Encounter (HOSPITAL_COMMUNITY): Payer: Self-pay

## 2015-09-24 DIAGNOSIS — Z01818 Encounter for other preprocedural examination: Secondary | ICD-10-CM

## 2015-09-24 DIAGNOSIS — Z01812 Encounter for preprocedural laboratory examination: Secondary | ICD-10-CM | POA: Insufficient documentation

## 2015-09-24 HISTORY — DX: Unspecified osteoarthritis, unspecified site: M19.90

## 2015-09-24 HISTORY — DX: Major depressive disorder, single episode, unspecified: F32.9

## 2015-09-24 HISTORY — DX: Headache, unspecified: R51.9

## 2015-09-24 HISTORY — DX: Headache: R51

## 2015-09-24 HISTORY — DX: Depression, unspecified: F32.A

## 2015-09-24 LAB — COMPREHENSIVE METABOLIC PANEL
ALT: 25 U/L (ref 14–54)
AST: 21 U/L (ref 15–41)
Albumin: 4 g/dL (ref 3.5–5.0)
Alkaline Phosphatase: 73 U/L (ref 38–126)
Anion gap: 10 (ref 5–15)
BILIRUBIN TOTAL: 0.5 mg/dL (ref 0.3–1.2)
BUN: 17 mg/dL (ref 6–20)
CHLORIDE: 105 mmol/L (ref 101–111)
CO2: 25 mmol/L (ref 22–32)
CREATININE: 0.84 mg/dL (ref 0.44–1.00)
Calcium: 9.7 mg/dL (ref 8.9–10.3)
Glucose, Bld: 93 mg/dL (ref 65–99)
POTASSIUM: 4.4 mmol/L (ref 3.5–5.1)
Sodium: 140 mmol/L (ref 135–145)
TOTAL PROTEIN: 6.7 g/dL (ref 6.5–8.1)

## 2015-09-24 LAB — CBC WITH DIFFERENTIAL/PLATELET
Basophils Absolute: 0 10*3/uL (ref 0.0–0.1)
Basophils Relative: 1 %
EOS PCT: 2 %
Eosinophils Absolute: 0.2 10*3/uL (ref 0.0–0.7)
HEMATOCRIT: 40.6 % (ref 36.0–46.0)
Hemoglobin: 13.5 g/dL (ref 12.0–15.0)
LYMPHS ABS: 2 10*3/uL (ref 0.7–4.0)
LYMPHS PCT: 27 %
MCH: 30.5 pg (ref 26.0–34.0)
MCHC: 33.3 g/dL (ref 30.0–36.0)
MCV: 91.9 fL (ref 78.0–100.0)
MONO ABS: 0.6 10*3/uL (ref 0.1–1.0)
Monocytes Relative: 8 %
Neutro Abs: 4.6 10*3/uL (ref 1.7–7.7)
Neutrophils Relative %: 62 %
PLATELETS: 260 10*3/uL (ref 150–400)
RBC: 4.42 MIL/uL (ref 3.87–5.11)
RDW: 13.3 % (ref 11.5–15.5)
WBC: 7.4 10*3/uL (ref 4.0–10.5)

## 2015-09-24 LAB — SURGICAL PCR SCREEN
MRSA, PCR: NEGATIVE
STAPHYLOCOCCUS AUREUS: NEGATIVE

## 2015-09-24 LAB — URINALYSIS, ROUTINE W REFLEX MICROSCOPIC
Bilirubin Urine: NEGATIVE
GLUCOSE, UA: NEGATIVE mg/dL
Hgb urine dipstick: NEGATIVE
KETONES UR: NEGATIVE mg/dL
LEUKOCYTES UA: NEGATIVE
NITRITE: NEGATIVE
PH: 7 (ref 5.0–8.0)
Protein, ur: NEGATIVE mg/dL
SPECIFIC GRAVITY, URINE: 1.019 (ref 1.005–1.030)

## 2015-09-24 LAB — TYPE AND SCREEN
ABO/RH(D): O POS
Antibody Screen: NEGATIVE

## 2015-09-24 LAB — APTT: aPTT: 31 seconds (ref 24–37)

## 2015-09-24 LAB — PROTIME-INR
INR: 0.9 (ref 0.00–1.49)
PROTHROMBIN TIME: 12.4 s (ref 11.6–15.2)

## 2015-09-24 LAB — ABO/RH: ABO/RH(D): O POS

## 2015-09-24 NOTE — Pre-Procedure Instructions (Signed)
    Lauren AguasSusan Napoli  09/24/2015      [id number] [place] - [address] [place] [id number] Phone: [phone] Fax: [phone]    Your procedure is scheduled on Tuesday, October 07, 2015 at 7:15 AM.   [facility] at 5:30 AM.   Call this number if you have problems the morning of surgery: [phone]   Any questions prior to day of surgery, please call [phone] between 8 & 4 PM.   Remember:  Do not eat food or drink liquids after midnight Monday, 10/06/15.  Stop NSAIDS (Ibuprofen, Aleve, etc.) and Herbal medications 7 days prior to surgery. Do not use any Aspirin products 7 days prior to surgery.    Do not wear jewelry, make-up or nail polish.  Do not wear lotions, powders, or perfumes.  You may wear deodorant.  Do not shave 48 hours prior to surgery.    Do not bring valuables to the hospital.  [facility] is not responsible for any belongings or valuables.  Contacts, dentures or bridgework may not be worn into surgery.  Leave your suitcase in the car.  After surgery it may be brought to your room.  For patients admitted to the hospital, discharge time will be determined by your treatment team.  Special instructions:  See "Preparing for Surgery" Instruction sheet.  Please read over the following fact sheets that you were given. Pain Booklet, Coughing and Deep Breathing, Blood Transfusion Information, MRSA Information and Surgical Site Infection Prevention

## 2015-09-26 LAB — URINE CULTURE

## 2015-09-29 NOTE — Progress Notes (Signed)
UA final results called to April at Dr.Whitfields office.

## 2015-10-01 NOTE — H&P (Signed)
CHIEF COMPLAINT:  Painful left knee.     HISTORY OF PRESENT ILLNESS:  Lauren Frye is a very pleasant 65 year old white female who is seen today for evaluation of her left knee.  She has had problems with this left knee for several years.  She states that both her knees have been painful, but her left one is certainly worse than her right one.  She has had bilateral knee arthroscopies performed in March 2016 and, at that time, she was noted to have partial lateral meniscectomies bilaterally, but there were significant degenerative changes in both of her knees.  She had done well with her right knee, but was still having persistent pain in her left knee in March 2016.  She continued to have catching and locking and popping symptoms and had started viscosupplementation in July 2016.  Her last injection was on February 10, 2015.  She returned back in January of this year and she was having nighttime pain as well as giving way symptoms.  She does have swelling and it does pop at times and also has problems of giving way.  Icing had been somewhat beneficial.  She does have crepitus when she does stairs and with walking.  She has used anti-inflammatories, including Aleve and ibuprofen intermittently, but these had caused gastroesophageal symptoms.  She has pain with every step as well as nighttime pain.  Essentially she has failed conservative treatment.  Seen today for evaluation.     Health history reveals a 65 year old white widowed female.     PAST SURGICAL HISTORY: 1.  In 1970, tonsillectomy. 2.  In 1978, D&C. 3.  In 1981, partial hysterectomy. 4.  In 1984, oophorectomy.  5.  In 1984 for bowel resection secondary to adhesions secondary to her surgeries for her hysterectomy and her oophorectomy.   6.  In 1998 for her left knee surgery, arthroscopic. 7.  In 2011 for ORIF of the ankle.  8.  In 2016 for bilateral knee arthroscopy.   9.  Childbirth, August 11, 1973, and August 19, 1978.     CURRENT  MEDICATIONS: 1.  Aleve p.r.n.  2.  Stress Tabs daily.  3.  Biotin daily.    ALLERGIES:  Codeine and penicillin.    REVIEW OF SYSTEMS:  A 14-point review of systems is positive for easy bruising.  She does have some ankle swelling in the left, which is where she had the open reduction internal fixation performed.  Urinary frequency of every 2-3 hours.  She only urinates once at nighttime.  She has monthly headaches, mainly that of migraines.     FAMILY HISTORY:  A mother who is still alive at age 65.  She has arthritis and has had breast cancer.  Father who is deceased at age 65 from congestive heart failure, COPD, diabetes mellitus and skin cancer.  She has no brothers.  She has 4 sisters, ages 9766, 8765, 3362, 861.     SOCIAL HISTORY:  Lauren Frye is a 65 year old white widowed female.  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  She does not smoke.  She drinks once a week, a glass of wine.     PHYSICAL EXAMINATION:  General:  She is a 65 year old white female, well developed, well nourished, alert, cooperative, in moderate distress secondary to bilateral knee pain, left greater than right.   Height is 5 feet 5-1/2 inches, weight 208, BMI 34.1.  Vital signs reveal a temperature of 98.4, pulse 62, respirations 14, blood pressure 97/70. Head:  Normocephalic.  Eyes:  Pupils equal, round and reactive to light and accommodation with extraocular movements intact.  Ears, nose and throat:  Benign. Neck:  Supple.  No carotid bruits were noted.  Chest:  Good expansion.   Lungs:  Essentially clear. Cardiac:   Regular rhythm and rate.  Normal S1, S2.  No murmurs were noted.     Pulses:  1+ bilateral and symmetric in the lower extremities.   Abdomen:  Obese, soft, nontender.  No masses palpable.  Normal bowel sounds present.   Genitorectal/breast:  Exam not indicated for an orthopedic evaluation.   CNS:  Oriented x3.  Cranial nerves II-XII grossly intact.   Musculoskeletal:  She has range of motion from about 3-4  degrees to 90 degrees.  She has a mild effusion.  Markedly tender to palpation over the joint lines, more medial than lateral.  She does have a mild effusion.  Pseudolaxity with varus and valgus stressing with good endpoints.  Skin:  Intact without ecchymosis or erythema.  No warmth in the joint.  Calves are supple, nontender.  No masses palpable.     CLINICAL IMPRESSION:   1.  End-stage OA of the left knee.   2.  Exogenous obesity.     RECOMMENDATIONS:  At this time, our plan would be to consider a total knee replacement.  I have reviewed a clearance form from Dr. Rennis Harding who feels that she is medically cleared for total joint replacement.  Therefore, our plan would be to proceed with a total knee on the left.  The procedure risks and benefits were fully explained to the patient.  She is understanding.  We have gone over the risks and benefits in detail.  All questions answered.   Oris Drone Aleda Grana [facility] [phone]  10/02/2015 4:47 PM

## 2015-10-06 MED ORDER — TRANEXAMIC ACID 1000 MG/10ML IV SOLN
2000.0000 mg | INTRAVENOUS | Status: AC
  Administered 2015-10-07: 2000 mg via TOPICAL
  Filled 2015-10-06: qty 20

## 2015-10-06 MED ORDER — ACETAMINOPHEN 10 MG/ML IV SOLN: 1000.0000 mg | INTRAVENOUS | Status: DC

## 2015-10-06 MED ORDER — SODIUM CHLORIDE 0.9 % IV SOLN: INTRAVENOUS | Status: DC

## 2015-10-06 MED ORDER — VANCOMYCIN HCL IN DEXTROSE 1-5 GM/200ML-% IV SOLN
1000.0000 mg | INTRAVENOUS | Status: AC
  Administered 2015-10-07: 1000 mg via INTRAVENOUS
  Filled 2015-10-06: qty 200

## 2015-10-07 ENCOUNTER — Encounter (HOSPITAL_COMMUNITY)
Admission: RE | Disposition: A | Discharge status: Skilled Nursing Facility | Payer: Self-pay | Source: Ambulatory Visit | Attending: Orthopaedic Surgery

## 2015-10-07 ENCOUNTER — Inpatient Hospital Stay (HOSPITAL_COMMUNITY): Payer: BLUE CROSS/BLUE SHIELD | Admitting: Certified Registered Nurse Anesthetist

## 2015-10-07 ENCOUNTER — Inpatient Hospital Stay (HOSPITAL_COMMUNITY)
Admission: RE | Admit: 2015-10-07 | Discharge: 2015-10-10 | DRG: 470 | Disposition: A | Discharge status: Skilled Nursing Facility | Payer: BLUE CROSS/BLUE SHIELD | Source: Ambulatory Visit | Attending: Orthopaedic Surgery | Admitting: Orthopaedic Surgery

## 2015-10-07 DIAGNOSIS — M659 Synovitis and tenosynovitis, unspecified: Secondary | ICD-10-CM | POA: Diagnosis present

## 2015-10-07 DIAGNOSIS — Z6834 Body mass index (BMI) 34.0-34.9, adult: Secondary | ICD-10-CM

## 2015-10-07 DIAGNOSIS — Z8249 Family history of ischemic heart disease and other diseases of the circulatory system: Secondary | ICD-10-CM

## 2015-10-07 DIAGNOSIS — Z885 Allergy status to narcotic agent status: Secondary | ICD-10-CM

## 2015-10-07 DIAGNOSIS — Z808 Family history of malignant neoplasm of other organs or systems: Secondary | ICD-10-CM

## 2015-10-07 DIAGNOSIS — Z825 Family history of asthma and other chronic lower respiratory diseases: Secondary | ICD-10-CM | POA: Diagnosis not present

## 2015-10-07 DIAGNOSIS — E669 Obesity, unspecified: Secondary | ICD-10-CM | POA: Diagnosis present

## 2015-10-07 DIAGNOSIS — Z880 Allergy status to penicillin: Secondary | ICD-10-CM | POA: Diagnosis not present

## 2015-10-07 DIAGNOSIS — F329 Major depressive disorder, single episode, unspecified: Secondary | ICD-10-CM | POA: Diagnosis present

## 2015-10-07 DIAGNOSIS — D62 Acute posthemorrhagic anemia: Secondary | ICD-10-CM | POA: Diagnosis not present

## 2015-10-07 DIAGNOSIS — E6609 Other obesity due to excess calories: Secondary | ICD-10-CM | POA: Diagnosis present

## 2015-10-07 DIAGNOSIS — M1712 Unilateral primary osteoarthritis, left knee: Secondary | ICD-10-CM | POA: Diagnosis present

## 2015-10-07 DIAGNOSIS — K5901 Slow transit constipation: Secondary | ICD-10-CM | POA: Diagnosis not present

## 2015-10-07 DIAGNOSIS — Z96659 Presence of unspecified artificial knee joint: Secondary | ICD-10-CM

## 2015-10-07 DIAGNOSIS — Z803 Family history of malignant neoplasm of breast: Secondary | ICD-10-CM | POA: Diagnosis not present

## 2015-10-07 DIAGNOSIS — Z96652 Presence of left artificial knee joint: Secondary | ICD-10-CM | POA: Diagnosis not present

## 2015-10-07 DIAGNOSIS — M1990 Unspecified osteoarthritis, unspecified site: Secondary | ICD-10-CM | POA: Diagnosis not present

## 2015-10-07 HISTORY — PX: TOTAL KNEE ARTHROPLASTY: SHX125

## 2015-10-07 SURGERY — ARTHROPLASTY, KNEE, TOTAL
Anesthesia: Monitor Anesthesia Care | Site: Knee | Laterality: Left

## 2015-10-07 MED ORDER — METOCLOPRAMIDE HCL 5 MG PO TABS: 5.0000 mg | ORAL_TABLET | Freq: Three times a day (TID) | ORAL | Status: DC | PRN

## 2015-10-07 MED ORDER — SODIUM CHLORIDE 0.9 % IV SOLN
INTRAVENOUS | Status: DC
  Administered 2015-10-07 – 2015-10-08 (×2): via INTRAVENOUS

## 2015-10-07 MED ORDER — GLYCOPYRROLATE 0.2 MG/ML IJ SOLN
INTRAMUSCULAR | Status: AC
  Filled 2015-10-07: qty 3

## 2015-10-07 MED ORDER — RIVAROXABAN 10 MG PO TABS
10.0000 mg | ORAL_TABLET | Freq: Every day | ORAL | Status: DC
  Administered 2015-10-08 – 2015-10-10 (×3): 10 mg via ORAL
  Filled 2015-10-07 (×3): qty 1

## 2015-10-07 MED ORDER — ONDANSETRON HCL 4 MG/2ML IJ SOLN
INTRAMUSCULAR | Status: AC
  Filled 2015-10-07: qty 2

## 2015-10-07 MED ORDER — HYDROMORPHONE HCL 1 MG/ML IJ SOLN
INTRAMUSCULAR | Status: AC
  Filled 2015-10-07: qty 1

## 2015-10-07 MED ORDER — HYDROMORPHONE HCL 1 MG/ML IJ SOLN
0.2500 mg | INTRAMUSCULAR | Status: DC | PRN
  Administered 2015-10-07 (×3): 0.5 mg via INTRAVENOUS

## 2015-10-07 MED ORDER — CHLORHEXIDINE GLUCONATE 4 % EX LIQD: 60.0000 mL | Freq: Once | CUTANEOUS | Status: DC

## 2015-10-07 MED ORDER — EPHEDRINE SULFATE 50 MG/ML IJ SOLN
INTRAMUSCULAR | Status: AC
  Filled 2015-10-07: qty 1

## 2015-10-07 MED ORDER — MEPERIDINE HCL 25 MG/ML IJ SOLN
6.2500 mg | INTRAMUSCULAR | Status: DC | PRN
  Administered 2015-10-07: 6.25 mg via INTRAVENOUS

## 2015-10-07 MED ORDER — PROPOFOL 10 MG/ML IV BOLUS
INTRAVENOUS | Status: AC
  Filled 2015-10-07: qty 20

## 2015-10-07 MED ORDER — BUPIVACAINE-EPINEPHRINE 0.25% -1:200000 IJ SOLN
INTRAMUSCULAR | Status: DC | PRN
  Administered 2015-10-07: 30 mL

## 2015-10-07 MED ORDER — ALUM & MAG HYDROXIDE-SIMETH 200-200-20 MG/5ML PO SUSP
30.0000 mL | ORAL | Status: DC | PRN
Start: 2015-10-07 — End: 2015-10-10

## 2015-10-07 MED ORDER — PHENYLEPHRINE HCL 10 MG/ML IJ SOLN
10.0000 mg | INTRAVENOUS | Status: DC | PRN
  Administered 2015-10-07: 20 ug/min via INTRAVENOUS

## 2015-10-07 MED ORDER — KETOROLAC TROMETHAMINE 15 MG/ML IJ SOLN
15.0000 mg | Freq: Four times a day (QID) | INTRAMUSCULAR | Status: AC
  Administered 2015-10-07 – 2015-10-08 (×4): 15 mg via INTRAVENOUS
  Filled 2015-10-07 (×3): qty 1

## 2015-10-07 MED ORDER — ACETAMINOPHEN 10 MG/ML IV SOLN
1000.0000 mg | Freq: Four times a day (QID) | INTRAVENOUS | Status: AC
Start: 2015-10-07 — End: 2015-10-08
  Administered 2015-10-07 – 2015-10-08 (×4): 1000 mg via INTRAVENOUS
  Filled 2015-10-07 (×4): qty 100

## 2015-10-07 MED ORDER — MIDAZOLAM HCL 2 MG/2ML IJ SOLN
INTRAMUSCULAR | Status: AC
  Filled 2015-10-07: qty 2

## 2015-10-07 MED ORDER — METHOCARBAMOL 1000 MG/10ML IJ SOLN
500.0000 mg | Freq: Four times a day (QID) | INTRAVENOUS | Status: DC | PRN
  Filled 2015-10-07: qty 5

## 2015-10-07 MED ORDER — MENTHOL 3 MG MT LOZG: 1.0000 | LOZENGE | OROMUCOSAL | Status: DC | PRN

## 2015-10-07 MED ORDER — METOCLOPRAMIDE HCL 5 MG/ML IJ SOLN: 5.0000 mg | Freq: Three times a day (TID) | INTRAMUSCULAR | Status: DC | PRN

## 2015-10-07 MED ORDER — BUPIVACAINE-EPINEPHRINE (PF) 0.5% -1:200000 IJ SOLN
INTRAMUSCULAR | Status: DC | PRN
  Administered 2015-10-07: 30 mL via PERINEURAL

## 2015-10-07 MED ORDER — METHOCARBAMOL 500 MG PO TABS
500.0000 mg | ORAL_TABLET | Freq: Four times a day (QID) | ORAL | Status: DC | PRN
  Administered 2015-10-08 – 2015-10-10 (×5): 500 mg via ORAL
  Filled 2015-10-07 (×5): qty 1

## 2015-10-07 MED ORDER — LACTATED RINGERS IV SOLN: INTRAVENOUS | Status: DC

## 2015-10-07 MED ORDER — BUPIVACAINE IN DEXTROSE 0.75-8.25 % IT SOLN
INTRATHECAL | Status: DC | PRN
  Administered 2015-10-07: 2 mL via INTRATHECAL

## 2015-10-07 MED ORDER — STERILE WATER FOR INJECTION IJ SOLN
INTRAMUSCULAR | Status: AC
  Filled 2015-10-07: qty 10

## 2015-10-07 MED ORDER — NEOSTIGMINE METHYLSULFATE 10 MG/10ML IV SOLN
INTRAVENOUS | Status: AC
  Filled 2015-10-07: qty 1

## 2015-10-07 MED ORDER — PHENYLEPHRINE 40 MCG/ML (10ML) SYRINGE FOR IV PUSH (FOR BLOOD PRESSURE SUPPORT)
PREFILLED_SYRINGE | INTRAVENOUS | Status: AC
  Filled 2015-10-07: qty 10

## 2015-10-07 MED ORDER — FENTANYL CITRATE (PF) 100 MCG/2ML IJ SOLN
INTRAMUSCULAR | Status: DC | PRN
  Administered 2015-10-07: 50 ug via INTRAVENOUS
  Administered 2015-10-07: 100 ug via INTRAVENOUS

## 2015-10-07 MED ORDER — POLYETHYLENE GLYCOL 3350 17 G PO PACK
17.0000 g | PACK | Freq: Every day | ORAL | Status: DC | PRN
  Administered 2015-10-08 – 2015-10-10 (×2): 17 g via ORAL
  Filled 2015-10-07 (×2): qty 1

## 2015-10-07 MED ORDER — VANCOMYCIN HCL IN DEXTROSE 1-5 GM/200ML-% IV SOLN
1000.0000 mg | Freq: Two times a day (BID) | INTRAVENOUS | Status: AC
Start: 2015-10-07 — End: 2015-10-07
  Administered 2015-10-07: 1000 mg via INTRAVENOUS
  Filled 2015-10-07: qty 200

## 2015-10-07 MED ORDER — SUCCINYLCHOLINE CHLORIDE 20 MG/ML IJ SOLN
INTRAMUSCULAR | Status: AC
  Filled 2015-10-07: qty 1

## 2015-10-07 MED ORDER — PHENOL 1.4 % MT LIQD: 1.0000 | OROMUCOSAL | Status: DC | PRN

## 2015-10-07 MED ORDER — LIDOCAINE HCL (CARDIAC) 20 MG/ML IV SOLN
INTRAVENOUS | Status: AC
  Filled 2015-10-07: qty 5

## 2015-10-07 MED ORDER — ROCURONIUM BROMIDE 50 MG/5ML IV SOLN
INTRAVENOUS | Status: AC
  Filled 2015-10-07: qty 1

## 2015-10-07 MED ORDER — BUPIVACAINE-EPINEPHRINE (PF) 0.25% -1:200000 IJ SOLN
INTRAMUSCULAR | Status: AC
  Filled 2015-10-07: qty 30

## 2015-10-07 MED ORDER — MAGNESIUM CITRATE PO SOLN: 1.0000 | Freq: Once | ORAL | Status: DC | PRN

## 2015-10-07 MED ORDER — ONDANSETRON HCL 4 MG/2ML IJ SOLN: 4.0000 mg | Freq: Four times a day (QID) | INTRAMUSCULAR | Status: DC | PRN

## 2015-10-07 MED ORDER — PROPOFOL 500 MG/50ML IV EMUL
INTRAVENOUS | Status: DC | PRN
  Administered 2015-10-07: 75 ug/kg/min via INTRAVENOUS

## 2015-10-07 MED ORDER — FENTANYL CITRATE (PF) 250 MCG/5ML IJ SOLN
INTRAMUSCULAR | Status: AC
  Filled 2015-10-07: qty 5

## 2015-10-07 MED ORDER — BISACODYL 10 MG RE SUPP
10.0000 mg | Freq: Every day | RECTAL | Status: DC | PRN
Start: 2015-10-07 — End: 2015-10-10

## 2015-10-07 MED ORDER — LACTATED RINGERS IV SOLN
INTRAVENOUS | Status: DC | PRN
  Administered 2015-10-07 (×2): via INTRAVENOUS

## 2015-10-07 MED ORDER — ONDANSETRON HCL 4 MG PO TABS: 4.0000 mg | ORAL_TABLET | Freq: Four times a day (QID) | ORAL | Status: DC | PRN

## 2015-10-07 MED ORDER — KETOROLAC TROMETHAMINE 15 MG/ML IJ SOLN
INTRAMUSCULAR | Status: AC
  Filled 2015-10-07: qty 1

## 2015-10-07 MED ORDER — DIPHENHYDRAMINE HCL 12.5 MG/5ML PO ELIX: 12.5000 mg | ORAL_SOLUTION | ORAL | Status: DC | PRN

## 2015-10-07 MED ORDER — SODIUM CHLORIDE 0.9 % IR SOLN
Status: DC | PRN
  Administered 2015-10-07: 1000 mL

## 2015-10-07 MED ORDER — HYDROMORPHONE HCL 2 MG PO TABS
2.0000 mg | ORAL_TABLET | ORAL | Status: DC | PRN
  Administered 2015-10-07 – 2015-10-08 (×3): 2 mg via ORAL
  Administered 2015-10-08 (×2): 4 mg via ORAL
  Administered 2015-10-08: 2 mg via ORAL
  Administered 2015-10-08 – 2015-10-09 (×5): 4 mg via ORAL
  Administered 2015-10-10: 2 mg via ORAL
  Administered 2015-10-10: 4 mg via ORAL
  Filled 2015-10-07: qty 1
  Filled 2015-10-07 (×2): qty 2
  Filled 2015-10-07 (×2): qty 1
  Filled 2015-10-07: qty 2
  Filled 2015-10-07: qty 1
  Filled 2015-10-07 (×3): qty 2
  Filled 2015-10-07: qty 1
  Filled 2015-10-07 (×2): qty 2

## 2015-10-07 MED ORDER — MEPERIDINE HCL 25 MG/ML IJ SOLN
INTRAMUSCULAR | Status: AC
  Filled 2015-10-07: qty 1

## 2015-10-07 MED ORDER — DOCUSATE SODIUM 100 MG PO CAPS
100.0000 mg | ORAL_CAPSULE | Freq: Two times a day (BID) | ORAL | Status: DC
  Administered 2015-10-07 – 2015-10-10 (×7): 100 mg via ORAL
  Filled 2015-10-07 (×7): qty 1

## 2015-10-07 MED ORDER — MIDAZOLAM HCL 5 MG/5ML IJ SOLN
INTRAMUSCULAR | Status: DC | PRN
  Administered 2015-10-07 (×2): 2 mg via INTRAVENOUS

## 2015-10-07 MED ORDER — PHENYLEPHRINE HCL 10 MG/ML IJ SOLN
INTRAMUSCULAR | Status: DC | PRN
  Administered 2015-10-07 (×4): 80 ug via INTRAVENOUS

## 2015-10-07 SURGICAL SUPPLY — 58 items
BANDAGE ESMARK 6X9 LF (GAUZE/BANDAGES/DRESSINGS) ×1 IMPLANT
BLADE SAGITTAL 25.0X1.19X90 (BLADE) ×2 IMPLANT
BLADE SAGITTAL 25.0X1.19X90MM (BLADE) ×1
BNDG CMPR 9X6 STRL LF SNTH (GAUZE/BANDAGES/DRESSINGS) ×1
BNDG ESMARK 6X9 LF (GAUZE/BANDAGES/DRESSINGS) ×3
BOWL SMART MIX CTS (DISPOSABLE) ×3 IMPLANT
CAP KNEE TOTAL 3 SIGMA ×2 IMPLANT
CEMENT HV SMART SET (Cement) ×6 IMPLANT
COVER SURGICAL LIGHT HANDLE (MISCELLANEOUS) ×3 IMPLANT
CUFF TOURNIQUET SINGLE 34IN LL (TOURNIQUET CUFF) ×2 IMPLANT
CUFF TOURNIQUET SINGLE 44IN (TOURNIQUET CUFF) IMPLANT
CWS 400 CLOSED WOUND ×2 IMPLANT
DRAPE EXTREMITY T 121X128X90 (DRAPE) ×1 IMPLANT
DRAPE PROXIMA HALF (DRAPES) ×3 IMPLANT
DRSG ADAPTIC 3X8 NADH LF (GAUZE/BANDAGES/DRESSINGS) ×2 IMPLANT
DRSG PAD ABDOMINAL 8X10 ST (GAUZE/BANDAGES/DRESSINGS) ×6 IMPLANT
DURAPREP 26ML APPLICATOR (WOUND CARE) ×6 IMPLANT
ELECT CAUTERY BLADE 6.4 (BLADE) ×3 IMPLANT
ELECT REM PT RETURN 9FT ADLT (ELECTROSURGICAL) ×3
ELECTRODE REM PT RTRN 9FT ADLT (ELECTROSURGICAL) ×1 IMPLANT
FACESHIELD WRAPAROUND (MASK) ×9 IMPLANT
FACESHIELD WRAPAROUND OR TEAM (MASK) ×2 IMPLANT
GAUZE SPONGE 4X4 12PLY STRL (GAUZE/BANDAGES/DRESSINGS) ×3 IMPLANT
GLOVE BIOGEL PI IND STRL 8 (GLOVE) ×1 IMPLANT
GLOVE BIOGEL PI IND STRL 8.5 (GLOVE) ×1 IMPLANT
GLOVE BIOGEL PI INDICATOR 8 (GLOVE) ×2
GLOVE BIOGEL PI INDICATOR 8.5 (GLOVE) ×2
GLOVE ECLIPSE 8.0 STRL XLNG CF (GLOVE) ×6 IMPLANT
GLOVE SURG ORTHO 8.5 STRL (GLOVE) ×6 IMPLANT
GOWN STRL REUS W/ TWL LRG LVL3 (GOWN DISPOSABLE) ×2 IMPLANT
GOWN STRL REUS W/TWL 2XL LVL3 (GOWN DISPOSABLE) ×3 IMPLANT
GOWN STRL REUS W/TWL LRG LVL3 (GOWN DISPOSABLE) ×6
HANDPIECE INTERPULSE COAX TIP (DISPOSABLE) ×3
KIT BASIN OR (CUSTOM PROCEDURE TRAY) ×3 IMPLANT
KIT ROOM TURNOVER OR (KITS) ×3 IMPLANT
MANIFOLD NEPTUNE II (INSTRUMENTS) ×3 IMPLANT
NEEDLE 22X1 1/2 (OR ONLY) (NEEDLE) ×3 IMPLANT
NS IRRIG 1000ML POUR BTL (IV SOLUTION) ×3 IMPLANT
PACK TOTAL JOINT (CUSTOM PROCEDURE TRAY) ×3 IMPLANT
PAD ARMBOARD 7.5X6 YLW CONV (MISCELLANEOUS) ×6 IMPLANT
PAD CAST 4YDX4 CTTN HI CHSV (CAST SUPPLIES) ×1 IMPLANT
PADDING CAST COTTON 4X4 STRL (CAST SUPPLIES) ×3
PADDING CAST COTTON 6X4 STRL (CAST SUPPLIES) ×3 IMPLANT
SET HNDPC FAN SPRY TIP SCT (DISPOSABLE) ×1 IMPLANT
STAPLER VISISTAT 35W (STAPLE) ×1 IMPLANT
SUCTION FRAZIER HANDLE 10FR (MISCELLANEOUS) ×2
SUCTION TUBE FRAZIER 10FR DISP (MISCELLANEOUS) ×1 IMPLANT
SUT BONE WAX W31G (SUTURE) ×3 IMPLANT
SUT ETHIBOND NAB CT1 #1 30IN (SUTURE) ×6 IMPLANT
SUT MNCRL AB 3-0 PS2 18 (SUTURE) ×3 IMPLANT
SUT VIC AB 0 CT1 27 (SUTURE) ×3
SUT VIC AB 0 CT1 27XBRD ANBCTR (SUTURE) ×1 IMPLANT
SYR CONTROL 10ML LL (SYRINGE) ×2 IMPLANT
TOWEL OR 17X24 6PK STRL BLUE (TOWEL DISPOSABLE) ×3 IMPLANT
TOWEL OR 17X26 10 PK STRL BLUE (TOWEL DISPOSABLE) ×3 IMPLANT
TRAY FOLEY CATH 16FRSI W/METER (SET/KITS/TRAYS/PACK) ×2 IMPLANT
WATER STERILE IRR 1000ML POUR (IV SOLUTION) ×3 IMPLANT
WRAP KNEE MAXI GEL POST OP (GAUZE/BANDAGES/DRESSINGS) ×3 IMPLANT

## 2015-10-07 NOTE — Progress Notes (Signed)
Orthopedic Tech Progress Note Patient Details:  Lauren AguasSusan Frye [birth year] [id number] Pt. refused evening CPM. Patient ID: Lauren Frye, female   DOB: [birth year], 65 y.o.   MRN: [id number]   [name], [name] L 10/07/2015, 8:07 PM

## 2015-10-07 NOTE — Anesthesia Postprocedure Evaluation (Signed)
Anesthesia Post Note  Patient: Lauren Frye  Procedure(s) Performed: Procedure(s) (LRB): TOTAL KNEE ARTHROPLASTY (Left)  Patient location during evaluation: PACU Anesthesia Type: Spinal Level of consciousness: oriented and awake and alert Pain management: pain level controlled Vital Signs Assessment: post-procedure vital signs reviewed and stable Respiratory status: spontaneous breathing, respiratory function stable and patient connected to nasal cannula oxygen Cardiovascular status: blood pressure returned to baseline and stable Postop Assessment: no headache, no backache and spinal receding Anesthetic complications: no    Last Vitals:  Filed Vitals:   10/07/15 1100 10/07/15 1115  BP: 104/72 101/71  Pulse: 101 72  Temp:    Resp: 20 9    Last Pain:  Filed Vitals:   10/07/15 1128  PainSc: 3                  Shelton SilvasKevin D Montanna Mcbain

## 2015-10-07 NOTE — Progress Notes (Signed)
Orthopedic Tech Progress Note Patient Details:  Theodis AguasSusan Cassady 09/19/1950 [id number]  CPM Left Knee CPM Left Knee: On Left Knee Flexion (Degrees): 90 Left Knee Extension (Degrees): 0 Additional Comments: Trapeze bar    Saul FordyceJennifer C Yarlin Breisch 10/07/2015, 4:36 PM

## 2015-10-07 NOTE — H&P (Signed)
  The recent History & Physical has been reviewed. I have personally examined the patient today. There is no interval change to the documented History & Physical. The patient would like to proceed with the procedure.  [name], PETER W 10/07/2015,  7:09 AM

## 2015-10-07 NOTE — Op Note (Signed)
PATIENT ID:      Lauren AguasSusan Frye  MRN:     [id number] DOB/AGE:    [birth year] / 65 y.o.       OPERATIVE REPORT    DATE OF PROCEDURE:  10/07/2015       PREOPERATIVE DIAGNOSIS:   LEFT KNEE OSTEOARTHRITIS-END STAGE                                                       Estimated body mass index is 34.71 kg/(m^2) as calculated from the following:   Height as of 09/24/15: 5\' 5"  (1.651 m).   Weight as of this encounter: 94.62 kg (208 lb 9.6 oz).     POSTOPERATIVE DIAGNOSIS:   LEFT KNEE OSTEOARTHRITIS -SAME                                                                    Estimated body mass index is 34.71 kg/(m^2) as calculated from the following:   Height as of 09/24/15: 5\' 5"  (1.651 m).   Weight as of this encounter: 94.62 kg (208 lb 9.6 oz).     PROCEDURE:  Procedure(s):LEFT TOTAL KNEE ARTHROPLASTY      SURGEON:  Norlene CampbellPeter Navy Belay, MD    ASSISTANT:   Jacqualine CodeBrian Petrarca, PA-C   (Present and scrubbed throughout the case, critical for assistance with exposure, retraction, instrumentation, and closure.)          ANESTHESIA: regional and spinal     DRAINS: (LEFT KNEE) Hemovact drain(s) in the CLAMPED with  Suction Clamped :      TOURNIQUET TIME:  Total Tourniquet Time Documented: Thigh (Left) - 63 minutes Total: Thigh (Left) - 63 minutes     COMPLICATIONS:  None   CONDITION:  stable  PROCEDURE IN DETAIL: [id number]: [id number]   Lauren Frye 10/07/2015, 9:17 AM

## 2015-10-07 NOTE — Progress Notes (Signed)
Pt was treated at home for UTI with bactrim.  Treatment completed on Sunday

## 2015-10-07 NOTE — Anesthesia Preprocedure Evaluation (Addendum)
Anesthesia Evaluation  Patient identified by MRN, date of birth, ID band Patient awake    Reviewed: Allergy & Precautions, NPO status , Patient's Chart, lab work & pertinent test results  Airway Mallampati: II  TM Distance: >3 FB Neck ROM: Full    Dental  (+) Teeth Intact   Pulmonary neg pulmonary ROS,    breath sounds clear to auscultation       Cardiovascular negative cardio ROS   Rhythm:Regular Rate:Normal     Neuro/Psych  Headaches, PSYCHIATRIC DISORDERS Depression    GI/Hepatic negative GI ROS, Neg liver ROS,   Endo/Other  negative endocrine ROS  Renal/GU negative Renal ROS  negative genitourinary   Musculoskeletal  (+) Arthritis ,   Abdominal   Peds negative pediatric ROS (+)  Hematology negative hematology ROS (+)   Anesthesia Other Findings   Reproductive/Obstetrics negative OB ROS                            Lab Results  Component Value Date   WBC 7.4 09/24/2015   HGB 13.5 09/24/2015   HCT 40.6 09/24/2015   MCV 91.9 09/24/2015   PLT 260 09/24/2015   Lab Results  Component Value Date   CREATININE 0.84 09/24/2015   BUN 17 09/24/2015   NA 140 09/24/2015   K 4.4 09/24/2015   CL 105 09/24/2015   CO2 25 09/24/2015   Lab Results  Component Value Date   INR 0.90 09/24/2015   INR 0.91 07/21/2011     Anesthesia Physical Anesthesia Plan  ASA: II  Anesthesia Plan: Spinal   Post-op Pain Management:  Regional for Post-op pain   Induction: Intravenous  Airway Management Planned: Natural Airway and Simple Face Mask  Additional Equipment:   Intra-op Plan:   Post-operative Plan:   Informed Consent: I have reviewed the patients History and Physical, chart, labs and discussed the procedure including the risks, benefits and alternatives for the proposed anesthesia with the patient or authorized representative who has indicated his/her understanding and acceptance.      Plan Discussed with: CRNA  Anesthesia Plan Comments:         Anesthesia Quick Evaluation

## 2015-10-07 NOTE — Addendum Note (Signed)
Addendum  created 10/07/15 1230 by Adonis HousekeeperJanna M Yvette Loveless, CRNA   Modules edited: Charges VN

## 2015-10-07 NOTE — Transfer of Care (Signed)
Immediate Anesthesia Transfer of Care Note  Patient: Lauren AguasSusan Frye  Procedure(s) Performed: Procedure(s): TOTAL KNEE ARTHROPLASTY (Left)  Patient Location: PACU  Anesthesia Type:MAC and Spinal  Level of Consciousness: awake, alert , oriented and patient cooperative  Airway & Oxygen Therapy: Patient Spontanous Breathing and Patient connected to nasal cannula oxygen  Post-op Assessment: Report given to RN and Post -op Vital signs reviewed and stable  Post vital signs: Reviewed and stable  Last Vitals:  Filed Vitals:   10/07/15 0612 10/07/15 0643  BP: 119/69   Pulse: 77   Temp:  36.6 C  Resp: 16     Complications: No apparent anesthesia complications

## 2015-10-07 NOTE — Anesthesia Procedure Notes (Addendum)
Anesthesia Regional Block:  Femoral nerve block  Pre-Anesthetic Checklist: ,, timeout performed, Correct Patient, Correct Site, Correct Laterality, Correct Procedure, Correct Position, site marked, Risks and benefits discussed,  Surgical consent,  Pre-op evaluation,  At surgeon's request and post-op pain management  Laterality: Left  Prep: chloraprep       Needles:  Injection technique: Single-shot  Needle Type: Echogenic Needle     Needle Length: 9cm 9 cm Needle Gauge: 21 and 21 G    Additional Needles:  Procedures: ultrasound guided (picture in chart) Femoral nerve block Narrative:  Start time: 10/07/2015 7:08 AM End time: 10/07/2015 7:12 AM Injection made incrementally with aspirations every 5 mL.  Performed by: Personally  Anesthesiologist: Suella Broad D  Additional Notes: No immediate complications noted. Pt tolerated well.    Spinal Patient location during procedure: OR Start time: 10/07/2015 7:28 AM End time: 10/07/2015 7:30 AM Staffing Anesthesiologist: Suella Broad D Performed by: anesthesiologist  Preanesthetic Checklist Completed: patient identified, site marked, surgical consent, pre-op evaluation, timeout performed, IV checked, risks and benefits discussed and monitors and equipment checked Spinal Block Patient position: sitting Prep: Betadine Patient monitoring: heart rate, continuous pulse ox, blood pressure and cardiac monitor Approach: midline Location: L4-5 Injection technique: single-shot Needle Needle type: Introducer and Pencan  Needle gauge: 24 G Needle length: 9 cm Additional Notes Negative paresthesia. Negative blood return. Positive free-flowing CSF. Expiration date of kit checked and confirmed. Patient tolerated procedure well, without complications.

## 2015-10-07 NOTE — Progress Notes (Signed)
Orthopedic Tech Progress Note Patient Details:  Lauren Frye [birth year] [id number]  CPM Left Knee CPM Left Knee: On Left Knee Flexion (Degrees): 90 Left Knee Extension (Degrees): 0 Additional Comments: Trapeze bar    Saul FordyceJennifer C Josephus Harriger 10/07/2015, 10:45 AM

## 2015-10-08 ENCOUNTER — Encounter (HOSPITAL_COMMUNITY): Payer: Self-pay | Admitting: General Practice

## 2015-10-08 LAB — BASIC METABOLIC PANEL
ANION GAP: 9 (ref 5–15)
BUN: 14 mg/dL (ref 6–20)
CHLORIDE: 103 mmol/L (ref 101–111)
CO2: 28 mmol/L (ref 22–32)
CREATININE: 0.98 mg/dL (ref 0.44–1.00)
Calcium: 8.3 mg/dL — ABNORMAL LOW (ref 8.9–10.3)
GFR calc non Af Amer: 60 mL/min — ABNORMAL LOW (ref >60)
Glucose, Bld: 99 mg/dL (ref 65–99)
POTASSIUM: 4.5 mmol/L (ref 3.5–5.1)
SODIUM: 140 mmol/L (ref 135–145)

## 2015-10-08 LAB — CBC
HEMATOCRIT: 34.5 % — AB (ref 36.0–46.0)
HEMOGLOBIN: 10.9 g/dL — AB (ref 12.0–15.0)
MCH: 29 pg (ref 26.0–34.0)
MCHC: 31.6 g/dL (ref 30.0–36.0)
MCV: 91.8 fL (ref 78.0–100.0)
Platelets: 197 10*3/uL (ref 150–400)
RBC: 3.76 MIL/uL — AB (ref 3.87–5.11)
RDW: 13.2 % (ref 11.5–15.5)
WBC: 7.2 10*3/uL (ref 4.0–10.5)

## 2015-10-08 NOTE — Op Note (Signed)
NAMERUBYE, STROHMEYER NO.:  [id number]  MEDICAL RECORD NO.:  [id number]  LOCATION:  5N03C                        FACILITY:  [facility]  PHYSICIAN:  Vonna Kotyk. Kailand Seda, M.D.DATE OF BIRTH:  04-Jun-1951  DATE OF PROCEDURE:  10/07/2015 DATE OF DISCHARGE:                              OPERATIVE REPORT   PREOPERATIVE DIAGNOSIS:  End-stage osteoarthritis, left knee.  POSTOPERATIVE DIAGNOSIS:  End-stage osteoarthritis, left knee.  PROCEDURE:  Left total knee replacement.  SURGEON:  Vonna Kotyk. Durward Fortes, M.D.  ASSISTANT:  Biagio Borg, PA-C, who was present throughout the operative procedure to ensure its timely completion.  ANESTHESIA:  Spinal with femoral nerve block.  COMPLICATIONS:  None.  COMPONENTS:  DePuy LCS standard plus femoral component and #3 rotating keeled tibial tray, 10 mm polyethylene bridging bearing, a metal-bag 3- peg rotating patella.  Components were secured with polymethyl methacrylate.  DESCRIPTION OF PROCEDURE:  Ms. Noguchi was met in the holding area, identified the left knee as the appropriate operative site.  Anesthesia performed a femoral nerve block.  The patient was then transported to room #7 and placed under IV sedation and spinal per Anesthesia.  Foley catheter was inserted by the nursing staff.  The urine was clear. A time-out was called.  The left lower extremity was placed in a thigh tourniquet.  The leg was then prepped with chlorhexidine, scrubbed and DuraPrep x2 from tourniquet to the tips of the toes.  Sterile draping was performed. Time-out was called again.  The extremity was then elevated, Esmarch exsanguinated with a proximal tourniquet at 350 mmHg.  A midline longitudinal incision was made, centered about the patella, extending from the superior pouch to tibial tubercle.  Via sharp dissection, incision was carried down to subcutaneous tissue.  First layer of capsule was incised in midline and medial parapatellar  incision was then made with the Bovie.  The joint was entered.  There was a clear yellow joint effusion.  The patella was everted 180 degrees laterally, the knee flexed 90 degrees.  There was a moderate amount of synovitis. Synovectomy was performed.  There were a large number of cartilaginous loose bodies in the joint in the superior pouch associated with multiple areas of large articular cartilage loss on the femur and to some extent, the medial tibial plateau.  These were irrigated and clean.  I measured a standard plus femoral component.  First, bony cut was then made on the proximal tibia with an 8 degree angle of declination using external tibial guide.  With each bony cut on both the tibia and the femur, we used an external guide.  Subsequent cuts were then made on the femur.  A 4-degree distal femoral valgus cut was used.  Using the standard plus femoral jig, finishing guide was then applied.  Lamina spreaders were inserted along the medial lateral compartments, medial and lateral menisci, and ACL and PCL were removed with the Bovie.  I did check flexion extension gaps, they were perfectly symmetrical at 10 mm.  Retractors were then placed about the tibia, was advanced anteriorly. We measured #3 tibial tray.  This was pinned in place, checked our alignment.  Center hole was made followed by  the keeled cut.  With the tibial trial still in place, we inserted the 10 mm bridging bearing, followed by the standard plus trial femoral component.  Entire construct was then reduced and through a full range of motion, we had perfect stability, no opening with varus or valgus stress.  ACL and PCL remained intact.  We had negative anterior drawer sign.  There was no malrotation of the tibial tray.  The patella was prepared by removing approximately 11 mm of bone, leaving 13 mm patella thickness.  The patella jig was applied, 3 holes made.  Standard trial patella inserted and through a  full range of motion, remained perfectly stable.  Trial components were removed.  The joint was copiously irrigated with saline solution.  The final components were then impacted with polymethyl methacrylate. We initially inserted the #3 tibial tray followed by the 10-mm polyethylene bridging bearing and the standard plus femoral component. Extraneous methacrylate was removed from the periphery of the components.  With the knee in extension, the patella was then applied with a patellar clamp and methacrylate.  The joint was irrigated.  While waiting for the methacrylate to mature, we injected the joint with 0.25% Marcaine with epinephrine.  At about approximately 16 minutes of methacrylate had matured, the joint was explored and clear, with tourniquet was removed, was released at 63 minutes.  Any gross bleeding was controlled with the Bovie.  Tranexamic acid was applied topically into the joint.  Medium-sized Hemovac was placed in the lateral compartment.  The deep capsule was closed with a running 0, #1 Ethibond.  Superficial capsule closed with running 0 Vicryl, subcu with 3-0 Monocryl.  Skin closed with skin clips.  Sterile bulky dressing was applied followed by patient's support stocking.  The patient tolerated the procedure well without complications.     Vonna Kotyk. Durward Fortes, M.D.     PWW/MEDQ  D:  10/07/2015  T:  10/08/2015  Job:  [id number]

## 2015-10-08 NOTE — [place] FL2 (Signed)
[place] MEDICAID FL2 LEVEL OF CARE SCREENING TOOL     IDENTIFICATION  Patient Name: Lauren AguasSusan Parlett Birthdate: 10-16-1950 Sex: female Admission Date (Current Location): 10/07/2015  [facility] [place]   Facility and Address:  [facility], [address], [place], [place] [address]      Provider Number: [id number]  Attending Physician Name and Address:  Valeria BatmanPeter W Whitfield, MD  Relative Name and Phone Number:       Current Level of Care: Hospital Recommended Level of Care: Skilled Nursing Facility Prior Approval Number:    Date Approved/Denied:   PASRR Number: [id number]  Discharge Plan: SNF    Current Diagnoses: Patient Active Problem List   Diagnosis Date Noted  . Osteoarthritis of left knee 10/07/2015  . Obesity 10/07/2015  . S/P total knee replacement using cement 10/07/2015    Orientation RESPIRATION BLADDER Height & Weight     Time, Situation, Place, Self  Normal Indwelling catheter Weight: 208 lb 9.6 oz (94.62 kg) Height:     BEHAVIORAL SYMPTOMS/MOOD NEUROLOGICAL BOWEL NUTRITION STATUS      Continent Diet (Please see discharge summary.)  AMBULATORY STATUS COMMUNICATION OF NEEDS Skin   Extensive Assist Verbally Surgical wounds                       Personal Care Assistance Level of Assistance  Bathing, Feeding, Dressing Bathing Assistance: Maximum assistance Feeding assistance: Independent Dressing Assistance: Maximum assistance     Functional Limitations Info             SPECIAL CARE FACTORS FREQUENCY  PT (By licensed PT), OT (By licensed OT)                    Contractures      Additional Factors Info  Code Status, Allergies Code Status Info: Not on file Allergies Info: Codeine, Penicillins, Propoxyphene, Prednisone           Current Medications (10/08/2015):  This is the current hospital active medication list Current Facility-Administered Medications  Medication Dose Route  Frequency Provider Last Rate Last Dose  . alum & mag hydroxide-simeth (MAALOX/MYLANTA) 200-200-20 MG/5ML suspension 30 mL  30 mL Oral Q4H PRN Jetty PeeksBrian D Petrarca, PA-C      . bisacodyl (DULCOLAX) suppository 10 mg  10 mg Rectal Daily PRN Jetty PeeksBrian D Petrarca, PA-C      . diphenhydrAMINE (BENADRYL) 12.5 MG/5ML elixir 12.5-25 mg  12.5-25 mg Oral Q4H PRN Jetty PeeksBrian D Petrarca, PA-C      . docusate sodium (COLACE) capsule 100 mg  100 mg Oral BID Oris DroneBrian D Petrarca, PA-C   100 mg at 10/08/15 [id number]  . HYDROmorphone (DILAUDID) tablet 2-4 mg  2-4 mg Oral Q4H PRN Jetty PeeksBrian D Petrarca, PA-C   4 mg at 10/08/15 1450  . magnesium citrate solution 1 Bottle  1 Bottle Oral Once PRN Jetty PeeksBrian D Petrarca, PA-C      . menthol-cetylpyridinium (CEPACOL) lozenge 3 mg  1 lozenge Oral PRN Jetty PeeksBrian D Petrarca, PA-C       Or  . phenol (CHLORASEPTIC) mouth spray 1 spray  1 spray Mouth/Throat PRN Jetty PeeksBrian D Petrarca, PA-C      . methocarbamol (ROBAXIN) tablet 500 mg  500 mg Oral Q6H PRN Oris DroneBrian D Petrarca, PA-C   500 mg at 10/08/15 1038   Or  . methocarbamol (ROBAXIN) 500 mg in dextrose 5 % 50 mL IVPB  500 mg Intravenous Q6H PRN Jetty PeeksBrian D Petrarca, PA-C      .  metoCLOPramide (REGLAN) tablet 5-10 mg  5-10 mg Oral Q8H PRN Oris Drone Petrarca, PA-C       Or  . metoCLOPramide (REGLAN) injection 5-10 mg  5-10 mg Intravenous Q8H PRN Oris Drone Petrarca, PA-C      . ondansetron (ZOFRAN) tablet 4 mg  4 mg Oral Q6H PRN Jetty Peeks, PA-C       Or  . ondansetron (ZOFRAN) injection 4 mg  4 mg Intravenous Q6H PRN Oris Drone Petrarca, PA-C      . polyethylene glycol (MIRALAX / GLYCOLAX) packet 17 g  17 g Oral Daily PRN Jetty Peeks, PA-C   17 g at 10/08/15 0844  . rivaroxaban (XARELTO) tablet 10 mg  10 mg Oral Q breakfast Jetty Peeks, PA-C   10 mg at 10/08/15 1610     Discharge Medications: Please see discharge summary for a list of discharge medications.  Relevant Imaging Results:  Relevant Lab Results:   Additional Information SSN:  [id number]  Rod Mae, LCSW

## 2015-10-08 NOTE — Progress Notes (Signed)
Orthopedic Tech Progress Note Patient Details:  Lauren AguasSusan Frye May 15, 1951 [id number]  Patient ID: Lauren AguasSusan Frye, female   DOB: May 15, 1951, 65 y.o.   MRN: [id number] Placed pt's lle on cpm @0 -60 degrees @1415   Nikki DomCrawford, Carden Teel 10/08/2015, 2:13 PM

## 2015-10-08 NOTE — Progress Notes (Signed)
Orthopedic Tech Progress Note Patient Details:  Theodis AguasSusan Fogleman Dec 26, 1950 [id number] Pt. refused evening CPM. Patient ID: Theodis AguasSusan Cadiente, female   DOB: Dec 26, 1950, 65 y.o.   MRN: [id number]   [name], Kodi Steil L 10/08/2015, 8:31 PM

## 2015-10-08 NOTE — Care Management Note (Signed)
Case Management Note  Patient Details  Name: Lauren AguasSusan Frye MRN: [id number] Date of Birth: [birth year]  Subjective/Objective:              S/p left total knee arthroplasty      Action/Plan: PT recommending SNF. Referral made to CSW. [facility], [facility] change in [facility] plan. Will continue to follow.   Expected Discharge Date:                  Expected Discharge Plan:  Skilled Nursing Facility  In-House Referral:  Clinical Social Work  Discharge planning Services  CM Consult  Post Acute Care Choice:  NA Choice offered to:     DME Arranged:  N/A DME Agency:  NA  HH Arranged:  NA HH Agency:     Status of Service:  In process, will continue to follow  Medicare Important Message Given:    Date Medicare IM Given:    Medicare IM give by:    Date Additional Medicare IM Given:    Additional Medicare Important Message give by:     If discussed at Long Length of Stay Meetings, dates discussed:    Additional Comments:  Monica BectonKrieg, Tewana Bohlen Watson, RN 10/08/2015, 1:27 PM

## 2015-10-08 NOTE — Clinical Social Work Note (Signed)
Clinical Social Work Assessment  Patient Details  Name: Lauren Frye MRN: [id number] Date of Birth: 04-09-1951  Date of referral:  10/08/15               Reason for consult:  Discharge Planning, Facility Placement                Permission sought to share information with:  Chartered certified accountant granted to share information::  Yes, Verbal Permission Granted  Name::        Agency::  [facility]  Relationship::     Contact Information:     Housing/Transportation Living arrangements for the past 2 months:  Single Family Home Source of Information:  Patient Patient Interpreter Needed:  None Criminal Activity/Legal Involvement Pertinent to Current Situation/Hospitalization:  No - Comment as needed Significant Relationships:  None Lives with:  Self Do you feel safe going back to the place where you live?  No Need for family participation in patient care:  No (Coment) (Patient able to make own decisions.)  Care giving concerns:  Patient expressed no concerns at this time.    Social Worker assessment / plan:  CSW received referral for possible SNF placement at time of discharge. CSW met with patient, who expressed to CSW that patient would prefer to discharge to [facility] at time of discharge. CSW to continue to follow and assist with discharge planning needs.  Employment status:  Retired Engineer, drilling) PT Recommendations:  [place] / Referral to community resources:  [place]  Patient/Family's Response to care:  Patient understanding and agreeable to CSW plan of care.  Patient/Family's Understanding of and Emotional Response to Diagnosis, Current Treatment, and Prognosis:  Patient understanding and agreeable to CSW plan of care.  Emotional Assessment Appearance:  Appears stated age Attitude/Demeanor/Rapport:  Other (Appropriate) Affect (typically observed):  Accepting, Appropriate,  Pleasant Orientation:  Oriented to Self, Oriented to Place, Oriented to  Time, Oriented to Situation Alcohol / Substance use:  Not Applicable Psych involvement (Current and /or in the community):  No (Comment) (Not appropriate on this admission.)  Discharge Needs  Concerns to be addressed:  No discharge needs identified Readmission within the last 30 days:  No Current discharge risk:  None Barriers to Discharge:  No Barriers Identified   Caroline Sauger, LCSW 10/08/2015, 3:29 PM [phone]

## 2015-10-08 NOTE — Evaluation (Signed)
Physical Therapy Evaluation Patient Details Name: Lauren Frye MRN: [id number] DOB: [birth year] Today's Date: 10/08/2015   History of Present Illness  Left TKA (10/07/15).  Patient has a past medical history of Depression; Headache; and Arthritis.  Past Surgical History  Procedure Laterality Date  . Abdominal hysterectomy    . Bowel resection    . Tonsillectomy    . Ankle fusion    . Knee surgery Bilateral     LEFT ARTHROCOPY  X3    ,  RT X1      Clinical Impression  Patient is s/p above surgery resulting in functional limitations due to the deficits listed below (see PT Problem List). She was motivated during PT session. Anticipate good progress. Patient will benefit from skilled PT to increase their independence and safety with mobility to allow discharge to the venue listed below.       Follow Up Recommendations SNF ([facility])    Equipment Recommendations  Rolling walker (2 wheel); 3-in-1 commode    Recommendations for Other Services OT as ordered     Precautions / Restrictions Restrictions Weight Bearing Restrictions: Yes LLE Weight Bearing: Partial weight bearing LLE Partial Weight Bearing Percentage or Pounds: 50%      Mobility  Bed Mobility Overal bed mobility: Min. guard             General bed mobility comments: Pt able to transition from supine to sitting EOB w/ min. guard; PT gently supported LLE (for pt comfort and confidence) as she swung it off bed.  Transfers Overall transfer level: Needs assistance Equipment used: Rolling walker (2 wheeled) Transfers: Sit to/from Stand Sit to Stand: Min guard         General transfer comment: Pt able to power up from sitting using RLE & RW and lower self down into chair/commode with safety cues for hand placement.  Ambulation/Gait Ambulation/Gait assistance: Min guard Ambulation Distance (Feet): 20 Feet Assistive device: Rolling walker (2 wheeled) Gait Pattern/deviations: Step-to pattern;Antalgic   Gait  velocity interpretation: Below normal speed for age/gender General Gait Details: Pt educated on how to use RW with 50% PWB. Pt activity tolerance limited by feelings of dizziness and nausea.  Stairs            Wheelchair Mobility    Modified Rankin (Stroke Patients Only)       Balance Overall balance assessment: Needs assistance Sitting-balance support: No upper extremity supported;Feet supported Sitting balance-Leahy Scale: Good     Standing balance support: Bilateral upper extremity supported Standing balance-Leahy Scale: Poor Standing balance comment: Relies on RW for balance.                             Pertinent Vitals/Pain Pain Assessment: 0-10 Pain Score: 4  (Pain score prior to exercise and ambulation.) Pain Location: Left knee Pain Descriptors / Indicators: Grimacing;Sore;Discomfort Pain Intervention(s): Limited activity within patient's tolerance;Monitored during session;Premedicated before session;Utilized relaxation techniques    Home Living Family/patient expects to be discharged to:: Skilled nursing facility [facility]) Living Arrangements: Alone                    Prior Function Level of Independence: Independent               Hand Dominance        Extremity/Trunk Assessment               Lower Extremity Assessment: Generalized weakness;LLE deficits/detail  LLE Deficits / Details: Limited strength and ROM in left knee.     Communication   Communication: No difficulties  Cognition Arousal/Alertness: Awake/alert;Suspect due to medications (Pt reported feeling dizzy upon ambulation.) Behavior During Therapy: WFL for tasks assessed/performed Overall Cognitive Status: Within Functional Limits for tasks assessed                      General Comments General comments (skin integrity, edema, etc.): Dressing on LLE appeared bloody, likely from drain removal; area of blood was outlined on dressing to document  in case it worsens. Pt lively at start of session, but reported feeling dizzy after getting out of bed and using the restroom; low BP recorded in the recliner (sitting and supine). See Doc Flowsheets for numbers. Pt energy and BP increased w/ time lying supine in recliner.    Exercises Total Joint Exercises Ankle Circles/Pumps: AROM;Both;10 reps;Supine Quad Sets: AROM;Left;10 reps;Supine Knee Flexion: AROM;Left;10 reps;Supine Goniometric ROM: Left knee flexion appx. 10-63 deg.      Assessment/Plan    PT Assessment Patient needs continued PT services  PT Diagnosis Difficulty walking;Generalized weakness;Acute pain   PT Problem List Decreased strength;Decreased range of motion;Decreased activity tolerance;Decreased balance;Decreased mobility;Pain  PT Treatment Interventions Gait training;Functional mobility training;Therapeutic activities;Therapeutic exercise;Neuromuscular re-education;Patient/family education   PT Goals (Current goals can be found in the Care Plan section) Acute Rehab PT Goals Patient Stated Goal: [facility]. PT Goal Formulation: With patient Time For Goal Achievement: 10/10/15 Potential to Achieve Goals: Good    Frequency 7X/week   Barriers to discharge        Co-evaluation               End of Session Equipment Utilized During Treatment: Gait belt Activity Tolerance: Treatment limited secondary to medical complications (Comment) (Feelings of dizziness and nausea, drop in BP.) Patient left: in chair;with call bell/phone within reach Nurse Communication:  (Informed about pt symptoms.)         Time: 1191-47821056-1136 PT Time Calculation (min) (ACUTE ONLY): 40 min   Charges:         PT G Codes:        Nathanial RancherMichelle Frye Every, SPT Acute Rehabilitation Services Office: [phone]  Nathanial RancherMichelle [name] 10/08/2015, 12:36 PM

## 2015-10-08 NOTE — Clinical Social Work Placement (Signed)
   CLINICAL SOCIAL WORK PLACEMENT  NOTE  Date:  10/08/2015  Patient Details  Name: Theodis AguasSusan Firman MRN: [id number] Date of Birth: 1951-01-17  Clinical Social Work is seeking post-discharge placement for this patient at the Skilled  Nursing Facility level of care (*CSW will initial, date and re-position this form in  chart as items are completed):  Yes   Patient/family provided with [facility] Clinical Social Work Department's list of facilities offering this level of care within the geographic area requested by the patient (or if unable, by the patient's family).  Yes   Patient/family informed of their freedom to choose among providers that offer the needed level of care, that participate in Medicare, Medicaid or managed care program needed by the patient, have an available bed and are willing to accept the patient.  Yes   Patient/family informed of [facility]'s ownership interest in [facility], [facility] and [facility], as well as of the fact that they are under no obligation to receive care at these facilities.  PASRR submitted to EDS on 10/08/15     PASRR number received on 10/08/15     Existing PASRR number confirmed on       FL2 transmitted to all facilities in geographic area requested by pt/family on 10/08/15     FL2 transmitted to all facilities within larger geographic area on       Patient informed that his/her managed care company has contracts with or will negotiate with certain facilities, including the following:            Patient/family informed of bed offers received.  Patient chooses bed at       Physician recommends and patient chooses bed at      Patient to be transferred to   on  .  Patient to be transferred to facility by       Patient family notified on   of transfer.  Name of family member notified:        PHYSICIAN Please sign FL2, Please prepare prescriptions     Additional Comment:    _______________________________________________ Rod MaeVaughn,  Kayvon Mo S, LCSW 10/08/2015, 3:33 PM

## 2015-10-08 NOTE — Progress Notes (Signed)
Orthopedic Tech Progress Note Patient Details:  Theodis AguasSusan Thew [birth year] [id number]  Patient ID: Theodis AguasSusan Tober, female   DOB: [birth year], 65 y.o.   MRN: [id number] Applied cpm 0-60  Trinna PostMartinez, Terianne Thaker J 10/08/2015, 5:34 AM

## 2015-10-08 NOTE — Discharge Instructions (Signed)
Information on my medicine - XARELTO® (Rivaroxaban) ° °This medication education was reviewed with me or my healthcare representative as part of my discharge preparation.  The pharmacist that spoke with me during my hospital stay was:  Breck Hollinger M, RPH ° °Why was Xarelto® prescribed for you? °Xarelto® was prescribed for you to reduce the risk of blood clots forming after orthopedic surgery. The medical term for these abnormal blood clots is venous thromboembolism (VTE). ° °What do you need to know about xarelto® ? °Take your Xarelto® ONCE DAILY at the same time every day. °You may take it either with or without food. ° °If you have difficulty swallowing the tablet whole, you may crush it and mix in applesauce just prior to taking your dose. ° °Take Xarelto® exactly as prescribed by your doctor and DO NOT stop taking Xarelto® without talking to the doctor who prescribed the medication.  Stopping without other VTE prevention medication to take the place of Xarelto® may increase your risk of developing a clot. ° °After discharge, you should have regular check-up appointments with your healthcare provider that is prescribing your Xarelto®.   ° °What do you do if you miss a dose? °If you miss a dose, take it as soon as you remember on the same day then continue your regularly scheduled once daily regimen the next day. Do not take two doses of Xarelto® on the same day.  ° °Important Safety Information °A possible side effect of Xarelto® is bleeding. You should call your healthcare provider right away if you experience any of the following: °? Bleeding from an injury or your nose that does not stop. °? Unusual colored urine (red or dark brown) or unusual colored stools (red or black). °? Unusual bruising for unknown reasons. °? A serious fall or if you hit your head (even if there is no bleeding). ° °Some medicines may interact with Xarelto® and might increase your risk of bleeding while on Xarelto®. To help avoid this,  consult your healthcare provider or pharmacist prior to using any new prescription or non-prescription medications, including herbals, vitamins, non-steroidal anti-inflammatory drugs (NSAIDs) and supplements. ° °This website has more information on Xarelto®: www.xarelto.com. ° ° °

## 2015-10-08 NOTE — Progress Notes (Signed)
Physical Therapy Treatment Patient Details Name: Lauren AguasSusan Wisher MRN: [id number] DOB: Nov 14, 1950 Today's Date: 10/08/2015    History of Present Illness Left TKA (10/07/15).    PT Comments    Patient made gradual progress toward mobility goals. BP 94/56 post ambulating in room with c/o nausea but no dizziness. Continue to progress as tolerated.   Follow Up Recommendations  SNF ([facility])     Equipment Recommendations  Other (comment) (determined at next venue)    Recommendations for Other Services       Precautions / Restrictions Precautions Precautions: Knee Precaution Booklet Issued: No Precaution Comments: reviewed no pillow under knee Restrictions Weight Bearing Restrictions: Yes LLE Weight Bearing: Partial weight bearing LLE Partial Weight Bearing Percentage or Pounds: 50%    Mobility  Bed Mobility Overal bed mobility: Modified Independent             General bed mobility comments: increased time and use of bedrail   Transfers Overall transfer level: Needs assistance Equipment used: Rolling walker (2 wheeled) Transfers: Sit to/from Stand Sit to Stand: Min guard         General transfer comment: cues for hand placement and technique to ensure WB status L LE; min guard for safety; cues for WB status upon standing; no c/o dizziness upon stand  Ambulation/Gait Ambulation/Gait assistance: Min guard Ambulation Distance (Feet): 35 Feet Assistive device: Rolling walker (2 wheeled) Gait Pattern/deviations: Step-to pattern;Antalgic;Trunk flexed     General Gait Details: cues for sequencing and technique; pt with difficulty maintaining 50% WB status when feeling fatigued   Stairs            Wheelchair Mobility    Modified Rankin (Stroke Patients Only)       Balance     Sitting balance-Leahy Scale: Good       Standing balance-Leahy Scale: Poor                      Cognition Arousal/Alertness: Awake/alert Behavior During Therapy:  WFL for tasks assessed/performed Overall Cognitive Status: Within Functional Limits for tasks assessed                      Exercises Total Joint Exercises Quad Sets: AROM;Left;10 reps;Seated Heel Slides: AROM;Left;10 reps;Seated Hip ABduction/ADduction: AROM;Left;10 reps;Seated Straight Leg Raises: AAROM;Left;5 reps;Seated    General Comments General comments (skin integrity, edema, etc.): no increase in drainage on bandage noted end of session; BP 94/56 post ambulation      Pertinent Vitals/Pain Pain Assessment: 0-10 Pain Score: 7  Pain Location: L knee Pain Descriptors / Indicators: Sore Pain Intervention(s): Limited activity within patient's tolerance;Monitored during session;Premedicated before session;Repositioned;Ice applied    Home Living Family/patient expects to be discharged to:: Other (Comment) (rehab ) Living Arrangements: Alone                  Prior Function            PT Goals (current goals can now be found in the care plan section) Acute Rehab PT Goals Patient Stated Goal: [facility]. PT Goal Formulation: With patient Time For Goal Achievement: 10/10/15 Potential to Achieve Goals: Good Progress towards PT goals: Progressing toward goals    Frequency  7X/week    PT Plan Current plan remains appropriate    Co-evaluation             End of Session Equipment Utilized During Treatment: Gait belt Activity Tolerance: Patient limited  by pain Patient left: in chair;with call bell/phone within reach     Time: 1526-1555 PT Time Calculation (min) (ACUTE ONLY): 29 min  Charges:  $Gait Training: 8-22 mins $Therapeutic Exercise: 8-22 mins                    G Codes:      Derek Mound, PTA Pager: [phone]   10/08/2015, 4:02 PM

## 2015-10-08 NOTE — Progress Notes (Signed)
Patient ID: Lauren Frye Frye, female   DOB: 02/28/1951, 65 y.o.   MRN: [id number] PATIENT ID: Lauren Frye Schlag        MRN:  [id number]          DOB/AGE: 02/28/1951 / 65 y.o.    Lauren CampbellPeter Whitfield, MD   Lauren CodeBrian Petrarca, PA-C [address] [place], [place]  [address]                             [phone]   PROGRESS NOTE  Subjective:  negative for Chest Pain  negative for Shortness of Breath  negative for Nausea/Vomiting   negative for Calf Pain    Tolerating Diet: yes         Patient reports pain as mild.       Objective: Vital signs in last 24 hours:   Patient Vitals for the past 24 hrs:  BP Temp Temp src Pulse Resp SpO2  10/08/15 0544 118/63 mmHg 98 F (36.7 C) Oral 81 18 99 %  10/08/15 0140 108/69 mmHg 97.9 F (36.6 C) Oral 79 18 99 %  10/07/15 2210 110/72 mmHg 98 F (36.7 C) Oral 73 18 98 %  10/07/15 1548 102/75 mmHg - - 80 17 99 %  10/07/15 1430 101/76 mmHg - - 79 - 100 %  10/07/15 1330 106/71 mmHg - - 73 16 98 %  10/07/15 1230 108/65 mmHg 97.7 F (36.5 C) - 75 16 98 %  10/07/15 1215 107/74 mmHg - - 72 (!) 9 98 %  10/07/15 1200 104/80 mmHg - - 74 11 99 %  10/07/15 1145 107/79 mmHg - - 82 14 100 %  10/07/15 1130 110/73 mmHg - - 73 (!) 9 99 %  10/07/15 1115 101/71 mmHg - - 72 (!) 9 99 %  10/07/15 1100 104/72 mmHg - - (!) 101 20 93 %  10/07/15 1045 98/66 mmHg - - 78 15 99 %  10/07/15 1030 101/69 mmHg - - 80 15 100 %  10/07/15 1015 102/69 mmHg - - 71 10 100 %  10/07/15 1000 95/67 mmHg - - 81 19 100 %  10/07/15 0945 91/64 mmHg - - 84 12 99 %  10/07/15 0930 (!) 87/52 mmHg 97.8 F (36.6 C) - (!) 103 (!) 8 95 %      Intake/Output from previous day:   04/04 0701 - 04/05 0700 In: 3445 [P.O.:600; I.V.:2345] Out: 2295 [Urine:1600; Drains:545]   Intake/Output this shift:       Intake/Output      04/04 0701 - 04/05 0700 04/05 0701 - 04/06 0700   P.O. 600    I.V. (mL/kg) 2345 (24.8)    IV Piggyback 500    Total Intake(mL/kg) 3445 (36.4)    Urine (mL/kg/hr) 1600 (0.7)    Drains 545 (0.2)    Blood 150 (0.1)    Total Output 2295     Net +1150             LABORATORY DATA:  Recent Labs  10/08/15 0352  WBC 7.2  HGB 10.9*  HCT 34.5*  PLT 197    Recent Labs  10/08/15 0352  NA 140  K 4.5  CL 103  CO2 28  BUN 14  CREATININE 0.98  GLUCOSE 99  CALCIUM 8.3*   Lab Results  Component Value Date   INR 0.90 09/24/2015   INR 0.91 07/21/2011    Recent Radiographic Studies :  Dg Chest 2 View  09/24/2015  CLINICAL DATA:  Preoperative exam prior left total knee arthroplasty, no current chest complaints, nonsmoker. EXAM: CHEST  2 VIEW COMPARISON:  PA and lateral chest x-ray of January 17, 2009 FINDINGS: The lungs are well-expanded and clear. The heart and pulmonary vascularity are normal. The mediastinum is normal in width. There is no pleural effusion. There is mild multilevel degenerative disc disease of the thoracic spine. IMPRESSION: There is no active cardiopulmonary disease. Electronically Signed   By: David  [place] M.D.   On: 09/24/2015 12:44     Examination:  General appearance: alert, cooperative and no distress  Wound Exam: clean, dry, intact   Drainage:  Scant/small amount Serosanguinous exudate in hemovac  Motor Exam: EHL, FHL and Anterior Tibial Intact  Sensory Exam: Superficial Peroneal, Deep Peroneal and Tibial normal  Vascular Exam: Normal  Assessment:    1 Day Post-Op  Procedure(s) (LRB): TOTAL KNEE ARTHROPLASTY (Left)  ADDITIONAL DIAGNOSIS:  Principal Problem:   Osteoarthritis of left knee Active Problems:   Obesity   S/P total knee replacement using cement  Acute Blood Loss Anemia-asymptomatic   Plan: Physical Therapy as ordered Partial Weight Bearing @ 50% (PWB)  DVT Prophylaxis:  Xarelto, Foot Pumps and TED hose  DISCHARGE PLAN: Skilled Nursing Facility/Rehab-[facility]  DISCHARGE NEEDS: HHPT, CPM, Walker and 3-in-1 comode seat  Awake, alert- comfortable, minimal pain probably as femoral block still  functioning, foley out...OOB with PT, D/C hemovac-hope for D/C in am     Lauren Frye W  10/08/2015 7:47 AM

## 2015-10-09 ENCOUNTER — Encounter (HOSPITAL_COMMUNITY): Payer: Self-pay | Admitting: Orthopaedic Surgery

## 2015-10-09 LAB — BASIC METABOLIC PANEL
ANION GAP: 11 (ref 5–15)
BUN: 11 mg/dL (ref 6–20)
CALCIUM: 8.4 mg/dL — AB (ref 8.9–10.3)
CO2: 24 mmol/L (ref 22–32)
Chloride: 101 mmol/L (ref 101–111)
Creatinine, Ser: 0.83 mg/dL (ref 0.44–1.00)
Glucose, Bld: 125 mg/dL — ABNORMAL HIGH (ref 65–99)
Potassium: 4.7 mmol/L (ref 3.5–5.1)
SODIUM: 136 mmol/L (ref 135–145)

## 2015-10-09 LAB — CBC
HEMATOCRIT: 32.3 % — AB (ref 36.0–46.0)
Hemoglobin: 10.9 g/dL — ABNORMAL LOW (ref 12.0–15.0)
MCH: 30.9 pg (ref 26.0–34.0)
MCHC: 33.7 g/dL (ref 30.0–36.0)
MCV: 91.5 fL (ref 78.0–100.0)
Platelets: 200 10*3/uL (ref 150–400)
RBC: 3.53 MIL/uL — ABNORMAL LOW (ref 3.87–5.11)
RDW: 13.2 % (ref 11.5–15.5)
WBC: 10.2 10*3/uL (ref 4.0–10.5)

## 2015-10-09 MED ORDER — METHOCARBAMOL 500 MG PO TABS: 500.0000 mg | ORAL_TABLET | Freq: Four times a day (QID) | ORAL | Status: DC | PRN

## 2015-10-09 MED ORDER — RIVAROXABAN 10 MG PO TABS: 10.0000 mg | ORAL_TABLET | Freq: Every day | ORAL | Status: DC

## 2015-10-09 MED ORDER — HYDROMORPHONE HCL 2 MG PO TABS: 2.0000 mg | ORAL_TABLET | ORAL | Status: DC | PRN

## 2015-10-09 NOTE — Progress Notes (Addendum)
Physical Therapy Treatment Patient Details Name: Lauren Frye MRN: [id number] DOB: 1951-02-04 Today's Date: 10/09/2015    History of Present Illness Left TKA (10/07/15).    PT Comments    Patient making progress with mobility and gait.  Per chart and CSW, patient to d/c to SNF this pm for continued therapy.  Follow Up Recommendations  SNF ([facility])     Equipment Recommendations  Other (comment) (TBD at next venue)    Recommendations for Other Services       Precautions / Restrictions Precautions Precautions: Knee Precaution Comments: Reviewed precautions Restrictions Weight Bearing Restrictions: Yes LLE Weight Bearing: Partial weight bearing LLE Partial Weight Bearing Percentage or Pounds: 50%    Mobility  Bed Mobility Overal bed mobility: Needs Assistance Bed Mobility: Sit to Supine       Sit to supine: Min assist   General bed mobility comments: Assist to bring LLE onto bed.  Placed patient in CPM at 50* flexion at end of session.  Transfers Overall transfer level: Needs assistance Equipment used: Rolling walker (2 wheeled) Transfers: Sit to/from Stand Sit to Stand: Min guard         General transfer comment: Assist for safety from recliner and toilet  Ambulation/Gait Ambulation/Gait assistance: Min guard Ambulation Distance (Feet): 70 Feet Assistive device: Rolling walker (2 wheeled) Gait Pattern/deviations: Step-through pattern;Decreased stance time - left;Decreased step length - right;Decreased step length - left;Decreased stride length;Decreased weight shift to left;Antalgic Gait velocity: decreased Gait velocity interpretation: Below normal speed for age/gender General Gait Details: Verbal cues for 50% WB on LLE.  Patient demonstrates safe use of RW.   Stairs            Wheelchair Mobility    Modified Rankin (Stroke Patients Only)       Balance                                    Cognition Arousal/Alertness:  Awake/alert Behavior During Therapy: WFL for tasks assessed/performed Overall Cognitive Status: Within Functional Limits for tasks assessed                      Exercises Total Joint Exercises Ankle Circles/Pumps: AROM;Both;10 reps;Supine Quad Sets: AROM;Left;10 reps;Supine Short Arc Quad: AROM;Left;10 reps;Supine Heel Slides: AROM;Left;10 reps;Supine Hip ABduction/ADduction: AROM;Left;10 reps;Supine Knee Flexion: AROM;Left;10 reps;Seated Goniometric ROM: 65* flexion    General Comments        Pertinent Vitals/Pain Pain Assessment: 0-10 Pain Score: 7  Pain Location: Lt knee Pain Descriptors / Indicators: Aching;Sore ("Pulling") Pain Intervention(s): Limited activity within patient's tolerance;Monitored during session;Premedicated before session;Repositioned    Home Living                      Prior Function            PT Goals (current goals can now be found in the care plan section) Acute Rehab PT Goals Patient Stated Goal: Go to [facility] for further rehab. Progress towards PT goals: Progressing toward goals    Frequency  7X/week    PT Plan Current plan remains appropriate    Co-evaluation             End of Session Equipment Utilized During Treatment: Gait belt Activity Tolerance: Patient tolerated treatment well;Patient limited by pain Patient left: in bed;in CPM;with call bell/phone within reach     Time: 4098-1191 PT Time Calculation (min) (  ACUTE ONLY): 32 min  Charges:  $Gait Training: 8-22 mins $Therapeutic Exercise: 8-22 mins                    G Codes:      Vena AustriaDavis, Reeda H 10/09/2015, 3:11 PM Durenda HurtSusan H. Renaldo Fiddleravis, PT, [facility] Acute Rehab Services Pager [phone]

## 2015-10-09 NOTE — Discharge Summary (Signed)
Lauren Campbell, MD   Jacqualine Code, PA-C [address] [place], [place]  [address]                             [phone]  PATIENT ID: Lauren Frye        MRN:  [id number]          DOB/AGE: 08-19-1950 / 65 y.o.    DISCHARGE SUMMARY  ADMISSION DATE:    10/07/2015 DISCHARGE DATE:   10/09/2015   ADMISSION DIAGNOSIS: LEFT KNEE OSTEOARTHRITIS    DISCHARGE DIAGNOSIS:  LEFT KNEE OSTEOARTHRITIS    ADDITIONAL DIAGNOSIS: Principal Problem:   Osteoarthritis of left knee Active Problems:   Obesity   S/P total knee replacement using cement  Past Medical History  Diagnosis Date  . Depression   . Headache   . Arthritis     PROCEDURE: Procedure(s): TOTAL KNEE ARTHROPLASTY Left on 10/07/2015  CONSULTS: none     HISTORY: Lauren Frye is a very pleasant 65 year old white female who is seen today for evaluation of her left knee. She has had problems with this left knee for several years. She states that both her knees have been painful, but her left one is certainly worse than her right one. She has had bilateral knee arthroscopies performed in March 2016 and, at that time, she was noted to have partial lateral meniscectomies bilaterally, but there were significant degenerative changes in both of her knees. She had done well with her right knee, but was still having persistent pain in her left knee in March 2016. She continued to have catching and locking and popping symptoms and had started viscosupplementation in July 2016. Her last injection was on February 10, 2015. She returned back in January of this year and she was having nighttime pain as well as giving way symptoms. She does have swelling and it does pop at times and also has problems of giving way. Icing had been somewhat beneficial. She does have crepitus when she does stairs and with walking. She has used anti-inflammatories, including Aleve and ibuprofen intermittently, but these had caused gastroesophageal symptoms. She has  pain with every step as well as nighttime pain. Essentially she has failed conservative treatment.  HOSPITAL COURSE:  Hilda Rynders is a 65 y.o. admitted on 10/07/2015 and found to have a diagnosis of LEFT KNEE OSTEOARTHRITIS.  After appropriate laboratory studies were obtained  they were taken to the operating room on 10/07/2015 and underwent  Procedure(s): TOTAL KNEE ARTHROPLASTY  Left.   They were given perioperative antibiotics:  Anti-infectives    Start     Dose/Rate Route Frequency Ordered Stop   10/07/15 1830  vancomycin (VANCOCIN) IVPB 1000 mg/200 mL premix     1,000 mg 200 mL/hr over 60 Minutes Intravenous Every 12 hours 10/07/15 1536 10/07/15 1942   10/07/15 0700  vancomycin (VANCOCIN) IVPB 1000 mg/200 mL premix     1,000 mg 200 mL/hr over 60 Minutes Intravenous To [facility]Stay Surgical 10/06/15 1313 10/07/15 0833    .  Tolerated the procedure well.  Placed with a foley intraoperatively.     Toradol was given post op.  POD #1, allowed out of bed to a chair.  PT for ambulation and exercise program.  Foley D/C'd in morning.  IV saline locked.  O2 discontionued. Hemovac pulled.  POD #2, continued PT and ambulation.     The remainder of the hospital course was dedicated to ambulation and strengthening.  The patient was discharged on 2 Days Post-Op in  Stable condition.  Blood products given:none  DIAGNOSTIC STUDIES: Recent vital signs: Patient Vitals for the past 24 hrs:  BP Temp Temp src Pulse Resp SpO2  10/09/15 0537 111/66 mmHg 98.7 F (37.1 C) Oral 87 18 96 %  10/08/15 1900 105/60 mmHg 98.8 F (37.1 C) Oral 89 18 99 %  10/08/15 1219 (!) 93/56 mmHg 97.8 F (36.6 C) - 78 18 100 %  10/08/15 1133 90/60 mmHg - - 73 - -  10/08/15 1128 (!) 66/50 mmHg - - 68 - -  10/08/15 1126 (!) 69/43 mmHg - - 74 - -       Recent laboratory studies:  Recent Labs  10/08/15 0352 10/09/15 0537  WBC 7.2 10.2  HGB 10.9* 10.9*  HCT 34.5* 32.3*  PLT 197 200    Recent Labs  10/08/15 0352  10/09/15 0537  NA 140 136  K 4.5 4.7  CL 103 101  CO2 28 24  BUN 14 11  CREATININE 0.98 0.83  GLUCOSE 99 125*  CALCIUM 8.3* 8.4*   Lab Results  Component Value Date   INR 0.90 09/24/2015   INR 0.91 07/21/2011     Recent Radiographic Studies :  Dg Chest 2 View  09/24/2015  CLINICAL DATA:  Preoperative exam prior left total knee arthroplasty, no current chest complaints, nonsmoker. EXAM: CHEST  2 VIEW COMPARISON:  PA and lateral chest x-ray of January 17, 2009 FINDINGS: The lungs are well-expanded and clear. The heart and pulmonary vascularity are normal. The mediastinum is normal in width. There is no pleural effusion. There is mild multilevel degenerative disc disease of the thoracic spine. IMPRESSION: There is no active cardiopulmonary disease. Electronically Signed   By: David  [place] M.D.   On: 09/24/2015 12:44    DISCHARGE INSTRUCTIONS: Discharge Instructions    CPM    Complete by:  As directed   Continuous passive motion machine (CPM):      Use the CPM from 0 to 60 degrees for 6-8 hours per day.      You may increase by 5-10 per day.  You may break it up into 2 or 3 sessions per day.      Use CPM for 3-4 weeks or until you are told to stop.     Call MD / Call 911    Complete by:  As directed   If you experience chest pain or shortness of breath, CALL 911 and be transported to the hospital emergency room.  If you develope a fever above 101 F, pus (white drainage) or increased drainage or redness at the wound, or calf pain, call your surgeon's office.     Change dressing    Complete by:  As directed   DO NOT CHANGE YOUR DRESSING.     Constipation Prevention    Complete by:  As directed   Drink plenty of fluids.  Prune juice may be helpful.  You may use a stool softener, such as Colace (over the counter) 100 mg twice a day.  Use MiraLax (over the counter) for constipation as needed.     Diet general    Complete by:  As directed      Discharge instructions    Complete by:  As  directed   INSTRUCTIONS AFTER JOINT REPLACEMENT   Remove items at home which could result in a fall. This includes throw rugs or furniture in walking pathways ICE to the affected joint every three  hours while awake for 30 minutes at a time, for at least the first 3-5 days, and then as needed for pain and swelling.  Continue to use ice for pain and swelling. You may notice swelling that will progress down to the foot and ankle.  This is normal after surgery.  Elevate your leg when you are not up walking on it.   Continue to use the breathing machine you got in the hospital (incentive spirometer) which will help keep your temperature down.  It is common for your temperature to cycle up and down following surgery, especially at night when you are not up moving around and exerting yourself.  The breathing machine keeps your lungs expanded and your temperature down.   DIET:  As you were doing prior to hospitalization, we recommend a well-balanced diet.  DRESSING / WOUND CARE / SHOWERING  Keep the surgical dressing until follow up.  The dressing is water proof, so you can shower without any extra covering.  IF THE DRESSING FALLS OFF or the wound gets wet inside, change the dressing with sterile gauze.  Please use good hand washing techniques before changing the dressing.  Do not use any lotions or creams on the incision until instructed by your surgeon.    ACTIVITY  Increase activity slowly as tolerated, but follow the weight bearing instructions below.   No driving for 6 weeks or until further direction given by your physician.  You cannot drive while taking narcotics.  No lifting or carrying greater than 10 lbs. until further directed by your surgeon. Avoid periods of inactivity such as sitting longer than an hour when not asleep. This helps prevent blood clots.  You may return to work once you are authorized by your doctor.     WEIGHT BEARING   Partial weight bearing with assist device as  directed.  50% weight bearing   EXERCISES  Results after joint replacement surgery are often greatly improved when you follow the exercise, range of motion and muscle strengthening exercises prescribed by your doctor. Safety measures are also important to protect the joint from further injury. Any time any of these exercises cause you to have increased pain or swelling, decrease what you are doing until you are comfortable again and then slowly increase them. If you have problems or questions, call your caregiver or physical therapist for advice.   Rehabilitation is important following a joint replacement. After just a few days of immobilization, the muscles of the leg can become weakened and shrink (atrophy).  These exercises are designed to build up the tone and strength of the thigh and leg muscles and to improve motion. Often times heat used for twenty to thirty minutes before working out will loosen up your tissues and help with improving the range of motion but do not use heat for the first two weeks following surgery (sometimes heat can increase post-operative swelling).   These exercises can be done on a training (exercise) mat,  on a table or on a bed. Use whatever works the best and is most comfortable for you.    Use music or television while you are exercising so that the exercises are a pleasant break in your day. This will make your life better with the exercises acting as a break in your routine that you can look forward to.   Perform all exercises about fifteen times, three times per day or as directed.  You should exercise both the operative leg and the other leg  as well.   Exercises include:  Quad Sets - Tighten up the muscle on the front of the thigh (Quad) and hold for 5-10 seconds.   Straight Leg Raises - With your knee straight (if you were given a brace, keep it on), lift the leg to 60 degrees, hold for 3 seconds, and slowly lower the leg.  Perform this exercise against  resistance later as your leg gets stronger.  Leg Slides: Lying on your back, slowly slide your foot toward your buttocks, bending your knee up off the floor (only go as far as is comfortable). Then slowly slide your foot back down until your leg is flat on the floor again.  Angel Wings: Lying on your back spread your legs to the side as far apart as you can without causing discomfort.  Hamstring Strength:  Lying on your back, push your heel against the floor with your leg straight by tightening up the muscles of your buttocks.  Repeat, but this time bend your knee to a comfortable angle, and push your heel against the floor.  You may put a pillow under the heel to make it more comfortable if necessary.   A rehabilitation program following joint replacement surgery can speed recovery and prevent re-injury in the future due to weakened muscles. Contact your doctor or a physical therapist for more information on knee rehabilitation.    CONSTIPATION  Constipation is defined medically as fewer than three stools per week and severe constipation as less than one stool per week.  Even if you have a regular bowel pattern at home, your normal regimen is likely to be disrupted due to multiple reasons following surgery.  Combination of anesthesia, postoperative narcotics, change in appetite and fluid intake all can affect your bowels.   YOU MUST use at least one of the following options; they are listed in order of increasing strength to get the job done.  They are all available over the counter, and you may need to use some, POSSIBLY even all of these options:    Drink plenty of fluids (prune juice may be helpful) and high fiber foods Colace 100 mg by mouth twice a day  Senokot for constipation as directed and as needed Dulcolax (bisacodyl), take with full glass of water  Miralax (polyethylene glycol) once or twice a day as needed.  If you have tried all these things and are unable to have a bowel movement  in the first 3-4 days after surgery call either your surgeon or your primary doctor.    If you experience loose stools or diarrhea, hold the medications until you stool forms back up.  If your symptoms do not get better within 1 week or if they get worse, check with your doctor.  If you experience "the worst abdominal pain ever" or develop nausea or vomiting, please contact the office immediately for further recommendations for treatment.   ITCHING:  If you experience itching with your medications, try taking only a single pain pill, or even half a pain pill at a time.  You can also use Benadryl over the counter for itching or also to help with sleep.   TED HOSE STOCKINGS:  Use stockings on both legs until for at least 2 weeks or as directed by physician office. They may be removed at night for sleeping.  MEDICATIONS:  See your medication summary on the "After Visit Summary" that nursing will review with you.  You may have some home medications which will be placed  on hold until you complete the course of blood thinner medication.  It is important for you to complete the blood thinner medication as prescribed.  PRECAUTIONS:  If you experience chest pain or shortness of breath - call 911 immediately for transfer to the hospital emergency department.   If you develop a fever greater that 101 F, purulent drainage from wound, increased redness or drainage from wound, foul odor from the wound/dressing, or calf pain - CONTACT YOUR SURGEON.                                                   FOLLOW-UP APPOINTMENTS:  If you do not already have a post-op appointment, please call the office for an appointment to be seen by your surgeon.  Guidelines for how soon to be seen are listed in your "After Visit Summary", but are typically between 1-4 weeks after surgery.  OTHER INSTRUCTIONS:   Knee Replacement:  Do not place pillow under knee, focus on keeping the knee straight while resting. CPM instructions: 0-90  degrees, 2 hours in the morning, 2 hours in the afternoon, and 2 hours in the evening. Place foam block, curve side up under heel at all times except when in CPM or when walking.  DO NOT modify, tear, cut, or change the foam block in any way.  MAKE SURE YOU:  Understand these instructions.  Get help right away if you are not doing well or get worse.    Thank you for letting us be a part of your medical care team.  It is a privilege we respect greatly.  We hope these instructions will help you stay on track for a fast and full recovery!     Do not put a pillow under the knee. Place it under the heel.    Complete by:  As directed      Driving restrictions    Complete by:  As directed   No driving for 6 weeks     Increase activity slowly as tolerated    Complete by:  As directed      Lifting restrictions    Complete by:  As directed   No lifting for 6 weeks     Partial weight bearing    Complete by:  As directed   % Body Weight:  50%  Laterality:  left  Extremity:  Lower     Patient may shower    Complete by:  As directed   You may shower over your dressing     TED hose    Complete by:  As directed   Use stockings (TED hose) for 3 weeks on left  leg.  You may remove them at night for sleeping.           DISCHARGE MEDICATIONS:     Medication List    STOP taking these medications        ibuprofen 200 MG tablet  Commonly known as:  ADVIL,MOTRIN      TAKE these medications        Biotin 5000 MCG Tabs  Take 1 tablet by mouth daily.     HYDROmorphone 2 MG tablet  Commonly known as:  DILAUDID  Take 1-2 tablets (2-4 mg total) by mouth every 4 (four) hours as needed for severe pain (1 - 2 TABLETS Q 4H PRN PAIN).  methocarbamol 500 MG tablet  Commonly known as:  ROBAXIN  Take 1 tablet (500 mg total) by mouth every 6 (six) hours as needed for muscle spasms.     PROBIOTIC DAILY PO  Take 1 capsule by mouth daily.     rivaroxaban 10 MG Tabs tablet  Commonly known as:   XARELTO  Take 1 tablet (10 mg total) by mouth daily with breakfast.     STRESS FORMULA Tabs  Take 1 tablet by mouth daily.        FOLLOW UP VISIT:       Follow-up Information    Follow up with Valeria BatmanWHITFIELD, PETER W, MD On 10/20/2015.   Specialty:  Orthopedic Surgery   Contact information:   [address] [place] [address] [phone]       DISPOSITION:   Skilled Nursing Facility/Rehab  CONDITION:  Stable   Oris DroneBrian D. Aleda Granaetrarca, PA-C [facility] [phone]  10/09/2015 10:01 AM

## 2015-10-09 NOTE — Progress Notes (Signed)
Occupational Therapy Evaluation Patient Details Name: Theodis AguasSusan Frieze MRN: [id number] DOB: 1950/11/21 Today's Date: 10/09/2015    History of Present Illness Left TKA (10/07/15).   Clinical Impression   Pt admitted with the above diagnoses and presents with below problem list. Pt will benefit from continued acute OT to address the below listed deficits and maximize independence with BADLs prior to d/c to venue below. PTA pt was independent with ADLs. Pt is currently min guard with ADLs. Pt completed toilet transfer, ambulating to/from bathroom during session. Session details below.     Follow Up Recommendations  SNF    Equipment Recommendations  Other (comment) (defer to next venue)    Recommendations for Other Services       Precautions / Restrictions Precautions Precautions: Knee Precaution Booklet Issued: No Precaution Comments: reviewed Restrictions Weight Bearing Restrictions: Yes LLE Weight Bearing: Partial weight bearing LLE Partial Weight Bearing Percentage or Pounds: 50%      Mobility Bed Mobility               General bed mobility comments: in recliner  Transfers Overall transfer level: Needs assistance Equipment used: Rolling walker (2 wheeled) Transfers: Sit to/from Stand Sit to Stand: Min guard         General transfer comment: from recliner, comfort height toilet with grab bars    Balance Overall balance assessment: Needs assistance Sitting-balance support: No upper extremity supported;Feet supported Sitting balance-Leahy Scale: Good     Standing balance support: Bilateral upper extremity supported;During functional activity Standing balance-Leahy Scale: Fair Standing balance comment: rw for support                            ADL Overall ADL's : Needs assistance/impaired Eating/Feeding: Set up;Sitting   Grooming: Set up;Sitting   Upper Body Bathing: Set up;Sitting   Lower Body Bathing: Min guard;Sit to/from stand   Upper  Body Dressing : Set up;Sitting   Lower Body Dressing: Min guard;Sit to/from stand   Toilet Transfer: Min guard;Ambulation;Comfort height toilet;Grab bars;RW   Toileting- ArchitectClothing Manipulation and Hygiene: Min guard;Sit to/from stand   Tub/ Shower Transfer: Walk-in shower;Min guard;Minimal assistance   Functional mobility during ADLs: Min guard;Rolling walker General ADL Comments: Pt completed toilet transfer at min guard level, ambulating to/from bathroom. ADL education provided.      Vision     Perception     Praxis      Pertinent Vitals/Pain Pain Assessment: 0-10 Pain Score: 4  Pain Location: L knee Pain Descriptors / Indicators: Sore Pain Intervention(s): Limited activity within patient's tolerance;Monitored during session;Repositioned     Hand Dominance     Extremity/Trunk Assessment Upper Extremity Assessment Upper Extremity Assessment: Overall WFL for tasks assessed   Lower Extremity Assessment Lower Extremity Assessment: Defer to PT evaluation       Communication Communication Communication: No difficulties   Cognition Arousal/Alertness: Awake/alert Behavior During Therapy: WFL for tasks assessed/performed Overall Cognitive Status: Within Functional Limits for tasks assessed                     General Comments       Exercises       Shoulder Instructions      Home Living Family/patient expects to be discharged to:: Skilled nursing facility Living Arrangements: Alone  Additional Comments: Lives alone, steps to enter house, walk-in shower      Prior Functioning/Environment Level of Independence: Independent             OT Diagnosis: Acute pain   OT Problem List: Impaired balance (sitting and/or standing);Decreased knowledge of use of DME or AE;Decreased knowledge of precautions;Pain   OT Treatment/Interventions: Self-care/ADL training;DME and/or AE instruction;Therapeutic  activities;Patient/family education;Balance training    OT Goals(Current goals can be found in the care plan section) Acute Rehab OT Goals Patient Stated Goal: Go to [facility] for further rehab. OT Goal Formulation: With patient Time For Goal Achievement: 10/16/15 Potential to Achieve Goals: Good ADL Goals Pt Will Perform Lower Body Bathing: with modified independence;sit to/from stand Pt Will Perform Lower Body Dressing: with modified independence;sit to/from stand Pt Will Transfer to Toilet: with modified independence;ambulating Pt Will Perform Toileting - Clothing Manipulation and hygiene: with modified independence;sit to/from stand Pt Will Perform Tub/Shower Transfer: with modified independence;ambulating;3 in 1;rolling walker  OT Frequency: Min 2X/week   Barriers to D/C:            Co-evaluation              End of Session Equipment Utilized During Treatment: Rolling walker CPM Left Knee CPM Left Knee: Off  Activity Tolerance: Patient tolerated treatment well Patient left: in chair;with call bell/phone within reach   Time: 1142-1152 OT Time Calculation (min): 10 min Charges:  OT General Charges $OT Visit: 1 Procedure OT Evaluation $OT Eval Low Complexity: 1 Procedure G-Codes:    Pilar Grammes October 21, 2015, 12:17 PM

## 2015-10-09 NOTE — Progress Notes (Signed)
Orthopedic Tech Progress Note Patient Details:  Theodis AguasSusan Malson 11-Apr-1951 [id number]  Patient ID: Theodis AguasSusan Kolker, female   DOB: 11-Apr-1951, 65 y.o.   MRN: [id number] Applied cpm 0-50. 0-60 was to painful.  Trinna PostMartinez, Brady Schiller J 10/09/2015, 5:14 AM

## 2015-10-10 ENCOUNTER — Non-Acute Institutional Stay (SKILLED_NURSING_FACILITY): Payer: BLUE CROSS/BLUE SHIELD | Admitting: Internal Medicine

## 2015-10-10 DIAGNOSIS — R51 Headache: Secondary | ICD-10-CM

## 2015-10-10 DIAGNOSIS — R519 Headache, unspecified: Secondary | ICD-10-CM

## 2015-10-10 DIAGNOSIS — Z96652 Presence of left artificial knee joint: Secondary | ICD-10-CM

## 2015-10-10 DIAGNOSIS — K5901 Slow transit constipation: Secondary | ICD-10-CM | POA: Diagnosis not present

## 2015-10-10 DIAGNOSIS — D62 Acute posthemorrhagic anemia: Secondary | ICD-10-CM | POA: Diagnosis not present

## 2015-10-10 DIAGNOSIS — M1990 Unspecified osteoarthritis, unspecified site: Secondary | ICD-10-CM | POA: Diagnosis not present

## 2015-10-10 LAB — BASIC METABOLIC PANEL
Anion gap: 11 (ref 5–15)
BUN: 7 mg/dL (ref 6–20)
CO2: 26 mmol/L (ref 22–32)
Calcium: 8.4 mg/dL — ABNORMAL LOW (ref 8.9–10.3)
Chloride: 101 mmol/L (ref 101–111)
Creatinine, Ser: 0.68 mg/dL (ref 0.44–1.00)
GFR calc Af Amer: >60 mL/min (ref >60)
GFR calc non Af Amer: >60 mL/min (ref >60)
Glucose, Bld: 116 mg/dL — ABNORMAL HIGH (ref 65–99)
Potassium: 4 mmol/L (ref 3.5–5.1)
Sodium: 138 mmol/L (ref 135–145)

## 2015-10-10 LAB — CBC
HCT: 31.6 % — ABNORMAL LOW (ref 36.0–46.0)
Hemoglobin: 10.1 g/dL — ABNORMAL LOW (ref 12.0–15.0)
MCH: 29 pg (ref 26.0–34.0)
MCHC: 32 g/dL (ref 30.0–36.0)
MCV: 90.8 fL (ref 78.0–100.0)
PLATELETS: 212 10*3/uL (ref 150–400)
RBC: 3.48 MIL/uL — AB (ref 3.87–5.11)
RDW: 13.3 % (ref 11.5–15.5)
WBC: 7.9 10*3/uL (ref 4.0–10.5)

## 2015-10-10 NOTE — Progress Notes (Signed)
Subjective: 3 Days Post-Op Procedure(s) (LRB): TOTAL KNEE ARTHROPLASTY (Left) Patient reports pain as mild.    Objective: Vital signs in last 24 hours: Temp:  [98.6 F (37 C)-99.3 F (37.4 C)] 98.6 F (37 C) (04/07 0517) Pulse Rate:  [87-94] 90 (04/07 0517) Resp:  [16-18] 16 (04/07 0517) BP: (102-114)/(67-72) 112/67 mmHg (04/07 0517) SpO2:  [96 %-100 %] 96 % (04/07 0517)  Intake/Output from previous day: 04/06 0701 - 04/07 0700 In: 720 [P.O.:720] Out: -  Intake/Output this shift:     Recent Labs  10/08/15 0352 10/09/15 0537 10/10/15 0448  HGB 10.9* 10.9* 10.1*    Recent Labs  10/09/15 0537 10/10/15 0448  WBC 10.2 7.9  RBC 3.53* 3.48*  HCT 32.3* 31.6*  PLT 200 212    Recent Labs  10/09/15 0537 10/10/15 0448  NA 136 138  K 4.7 4.0  CL 101 101  CO2 24 26  BUN 11 7  CREATININE 0.83 0.68  GLUCOSE 125* 116*  CALCIUM 8.4* 8.4*   No results for input(s): LABPT, INR in the last 72 hours.  Neurologically intact ABD soft Sensation intact distally  Assessment/Plan: 3 Days Post-Op Procedure(s) (LRB): TOTAL KNEE ARTHROPLASTY (Left) Up with therapy Discharge to SNF-pre cert for rehab apparently an issue thus delay in discharge-hopefully this can be resolved today. Otherwise stable  Lauren Frye, Claude MangesETER W 10/10/2015, 7:54 AM

## 2015-10-10 NOTE — Clinical Social Work Note (Signed)
Patient to be discharged to [facility]. Patient updated regarding discharge. Patient to be transported via EMS.  RN provided with report number.  Marcelline Deistmily Natha Guin, LCSW [phone] Orthopedics: [phone] Surgical: [phone]

## 2015-10-10 NOTE — Clinical Social Work Placement (Signed)
   CLINICAL SOCIAL WORK PLACEMENT  NOTE  Date:  10/10/2015  Patient Details  Name: Lauren Frye MRN: [id number] Date of Birth: [birth year]  Clinical Social Work is seeking post-discharge placement for this patient at the Skilled  Nursing Facility level of care (*CSW will initial, date and re-position this form in  chart as items are completed):  Yes   Patient/family provided with [facility] Clinical Social Work Department's list of facilities offering this level of care within the geographic area requested by the patient (or if unable, by the patient's family).  Yes   Patient/family informed of their freedom to choose among providers that offer the needed level of care, that participate in Medicare, Medicaid or managed care program needed by the patient, have an available bed and are willing to accept the patient.  Yes   Patient/family informed of [facility]'s ownership interest in [facility] and [facility], as well as of the fact that they are under no obligation to receive care at these facilities.  PASRR submitted to EDS on 10/08/15     PASRR number received on 10/08/15     Existing PASRR number confirmed on       FL2 transmitted to all facilities in geographic area requested by pt/family on 10/08/15     FL2 transmitted to all facilities within larger geographic area on       Patient informed that his/her managed care company has contracts with or will negotiate with certain facilities, including the following:        Yes   Patient/family informed of bed offers received.  Patient chooses bed at [facility]     Physician recommends and patient chooses bed at      Patient to be transferred to [facility] on 10/10/15.  Patient to be transferred to facility by [facility]     Patient family notified on 10/10/15 of transfer.  Name of family member notified:  Patient     PHYSICIAN Please sign FL2, Please prepare prescriptions      Additional Comment:    _______________________________________________ Rod MaeVaughn, [name], LCSW 10/10/2015, 1:03 PM

## 2015-10-10 NOTE — Progress Notes (Signed)
[facility], all questions concerns addressed. Will continue to monitor

## 2015-10-10 NOTE — Progress Notes (Signed)
Physical Therapy Treatment Patient Details Name: Lauren AguasSusan Frye MRN: [id number] DOB: 26-Sep-1950 Today's Date: 10/10/2015    History of Present Illness Left TKA (10/07/15).    PT Comments    Patient is progressing toward goals and with decreased c/o pain with mobility. Continue to progress as tolerated until d/c.  Follow Up Recommendations  SNF ([facility])     Equipment Recommendations  Other (comment) (TBD at next venue)    Recommendations for Other Services       Precautions / Restrictions Precautions Precautions: Knee Precaution Comments: Reviewed precautions Restrictions Weight Bearing Restrictions: Yes LLE Weight Bearing: Partial weight bearing LLE Partial Weight Bearing Percentage or Pounds: 50%    Mobility  Bed Mobility               General bed mobility comments: OOB in chair upon arrival  Transfers Overall transfer level: Needs assistance Equipment used: Rolling walker (2 wheeled) Transfers: Sit to/from Stand Sit to Stand: Min guard         General transfer comment: min gaurd for safety; cues for technique when descending to chair  Ambulation/Gait Ambulation/Gait assistance: Min guard Ambulation Distance (Feet): 100 Feet Assistive device: Rolling walker (2 wheeled) Gait Pattern/deviations: Step-to pattern Gait velocity: decreased   General Gait Details: maintain PWB with min cues; standing rest break    Stairs            Wheelchair Mobility    Modified Rankin (Stroke Patients Only)       Balance Overall balance assessment: Needs assistance Sitting-balance support: No upper extremity supported;Feet supported Sitting balance-Leahy Scale: Good     Standing balance support: Bilateral upper extremity supported Standing balance-Leahy Scale: Fair                      Cognition Arousal/Alertness: Awake/alert Behavior During Therapy: WFL for tasks assessed/performed Overall Cognitive Status: Within Functional Limits for  tasks assessed                      Exercises Total Joint Exercises Quad Sets: AROM;Left;15 reps;Seated Heel Slides: AROM;Left;15 reps;Seated Hip ABduction/ADduction: AROM;Left;10 reps;Seated Knee Flexion: AROM;Left;10 reps;Seated Goniometric ROM: 0-70    General Comments        Pertinent Vitals/Pain Pain Assessment: 0-10 Pain Score: 2  Pain Location: L knee Pain Descriptors / Indicators: Sore Pain Intervention(s): Limited activity within patient's tolerance;Monitored during session;Repositioned    Home Living                      Prior Function            PT Goals (current goals can now be found in the care plan section) Acute Rehab PT Goals Patient Stated Goal: [facility]. Progress towards PT goals: Progressing toward goals    Frequency  7X/week    PT Plan Current plan remains appropriate    Co-evaluation             End of Session Equipment Utilized During Treatment: Gait belt Activity Tolerance: Patient tolerated treatment well Patient left: with call bell/phone within reach;in chair     Time: [id number] PT Time Calculation (min) (ACUTE ONLY): 17 min  Charges:  $Gait Training: 8-22 mins                    G Codes:      Derek MoundKellyn R Avani Sensabaugh Ilaria Much, PTA Pager: [phone]   10/10/2015,  9:07 AM

## 2015-10-11 ENCOUNTER — Encounter: Payer: Self-pay | Admitting: Internal Medicine

## 2015-10-11 DIAGNOSIS — M1990 Unspecified osteoarthritis, unspecified site: Secondary | ICD-10-CM | POA: Insufficient documentation

## 2015-10-11 DIAGNOSIS — D62 Acute posthemorrhagic anemia: Secondary | ICD-10-CM | POA: Insufficient documentation

## 2015-10-11 DIAGNOSIS — R519 Headache, unspecified: Secondary | ICD-10-CM | POA: Insufficient documentation

## 2015-10-11 DIAGNOSIS — R51 Headache: Secondary | ICD-10-CM

## 2015-10-11 DIAGNOSIS — K5900 Constipation, unspecified: Secondary | ICD-10-CM | POA: Insufficient documentation

## 2015-10-11 NOTE — Progress Notes (Signed)
MRN: [id number] Name: Lauren Frye  Sex: female Age: 65 y.o. DOB: 07/31/1950  [facility] Facility/Room:515 Level Of Care: SNF Provider: Merrilee SeashoreALEXANDER, Aftin Lye Frye Emergency Contacts: Extended Emergency Contact Information Primary Emergency Contact: Lauren Frye          [place], [place] [id number] [place] Home Phone: [phone] Relation: Friend  Code Status:   Allergies: Codeine; Penicillins; Propoxyphene; and Prednisone  Chief Complaint  Patient presents with  . New Admit To SNF    HPI: Patient is 65 y.o. female who [facility] and found to have a diagnosis of LEFT KNEE OSTEOARTHRITIS. After appropriate laboratory studies were obtainedshe was taken to the operating room on 10/07/2015 and underwent Procedure(s):TOTAL KNEE ARTHROPLASTY Left. Hospital course was complicated by a drop in Hb which did not, barely, require a transfusion. Pt is admitted to SNF for OT/PT. While at SNF pt will be followed for headaches, tx with advil normally, but now with tylenol, constipation, tx with colace and miralax and arthritis, normally tx with advil, but now with tylenol.  Past Medical History  Diagnosis Date  . Depression   . Headache   . Arthritis     Past Surgical History  Procedure Laterality Date  . Abdominal hysterectomy    . Bowel resection    . Tonsillectomy    . Ankle fusion    . Knee surgery Bilateral     LEFT ARTHROCOPY  X3    ,  RT X1  . Total knee arthroplasty Left 10/07/2015  . Total knee arthroplasty Left 10/07/2015    Procedure: TOTAL KNEE ARTHROPLASTY;  Surgeon: Lauren BatmanPeter W Whitfield, MD;  [facility];  Service: Orthopedics;  Laterality: Left;      Medication List    Notice    This visit is on the same day as an admission, and a visit start time could not be determined. If the visit took place after discharge, manually review the med list with the patient.      No orders of the defined types were placed in this encounter.      There is no immunization history on file for this patient.  Social History  Substance Use Topics  . Smoking status: Never Smoker   . Smokeless tobacco: Never Used  . Alcohol Use: 0.6 oz/week    1 Glasses of wine per week     Comment: once week    Family history is + CHF, COPD   Review of Systems  DATA OBTAINED: from patient, nurse, GENERAL:  no fevers, fatigue, appetite changes SKIN: No itching, rash or wounds EYES: No eye pain, redness, discharge EARS: No earache, tinnitus, change in hearing NOSE: No congestion, drainage or bleeding  MOUTH/THROAT: No mouth or tooth pain, No sore throat RESPIRATORY: No cough, wheezing, SOB CARDIAC: No chest pain, palpitations, lower extremity edema  GI: No abdominal pain, No N/V/Frye or constipation, No heartburn or reflux  GU: No dysuria, frequency or urgency, or incontinence  MUSCULOSKELETAL: No unrelieved bone/joint pain NEUROLOGIC: No headache, dizziness or focal weakness PSYCHIATRIC: No c/o anxiety or sadness   Filed Vitals:   10/11/15 1733  BP: 110/75  Pulse: 94  Temp: 98.2 F (36.8 C)  Resp: 18    SpO2 Readings from Last 1 Encounters:  10/10/15 96%        Physical Exam  GENERAL APPEARANCE: Alert, conversant,  No acute distress.  SKIN: No diaphoresis rash; incision area with minimal heat, as expected, but no redness HEAD: Normocephalic,  atraumatic  EYES: Conjunctiva/lids clear. Pupils round, reactive. EOMs intact.  EARS: External exam WNL, canals clear. Hearing grossly normal.  NOSE: No deformity or discharge.  MOUTH/THROAT: Lips w/o lesions  RESPIRATORY: Breathing is even, unlabored. Lung sounds are clear   CARDIOVASCULAR: Heart RRR no murmurs, rubs or gallops. Mild LLE edema, no more than expectedperipheral edema.   GASTROINTESTINAL: Abdomen is soft, non-tender, not distended w/ normal bowel sounds. GENITOURINARY: Bladder non tender, not distended  MUSCULOSKELETAL: No abnormal joints or musculature NEUROLOGIC:   Cranial nerves 2-12 grossly intact. Moves all extremities  PSYCHIATRIC: Mood and affect appropriate to situation, no behavioral issues  Patient Active Problem List   Diagnosis Date Noted  . Postoperative anemia due to acute blood loss 10/11/2015  . Arthritis 10/11/2015  . Constipation 10/11/2015  . Headache 10/11/2015  . Osteoarthritis of left knee 10/07/2015  . Obesity 10/07/2015  . S/P total knee replacement using cement 10/07/2015    CBC    Component Value Date/Time   WBC 7.9 10/10/2015 0448   RBC 3.48* 10/10/2015 0448   HGB 10.1* 10/10/2015 0448   HCT 31.6* 10/10/2015 0448   PLT 212 10/10/2015 0448   MCV 90.8 10/10/2015 0448   LYMPHSABS 2.0 09/24/2015 1013   MONOABS 0.6 09/24/2015 1013   EOSABS 0.2 09/24/2015 1013   BASOSABS 0.0 09/24/2015 1013    CMP     Component Value Date/Time   NA 138 10/10/2015 0448   K 4.0 10/10/2015 0448   CL 101 10/10/2015 0448   CO2 26 10/10/2015 0448   GLUCOSE 116* 10/10/2015 0448   BUN 7 10/10/2015 0448   CREATININE 0.68 10/10/2015 0448   CALCIUM 8.4* 10/10/2015 0448   PROT 6.7 09/24/2015 1013   ALBUMIN 4.0 09/24/2015 1013   AST 21 09/24/2015 1013   ALT 25 09/24/2015 1013   ALKPHOS 73 09/24/2015 1013   BILITOT 0.5 09/24/2015 1013   GFRNONAA >60 10/10/2015 0448   GFRAA >60 10/10/2015 0448    No results found for: HGBA1C   Dg Chest 2 View  09/24/2015  CLINICAL DATA:  Preoperative exam prior left total knee arthroplasty, no current chest complaints, nonsmoker. EXAM: CHEST  2 VIEW COMPARISON:  PA and lateral chest x-ray of January 17, 2009 FINDINGS: The lungs are well-expanded and clear. The heart and pulmonary vascularity are normal. The mediastinum is normal in width. There is no pleural effusion. There is mild multilevel degenerative disc disease of the thoracic spine. IMPRESSION: There is no active cardiopulmonary disease. Electronically Signed   By: Lauren  Frye M.Frye.   On: 09/24/2015 12:44    Not all labs, radiology exams or  other studies done during hospitalization come through on my EPIC note; however they are reviewed by me.    Assessment and Plan  S/P total knee replacement using cement SNF - for prior replacement that failed; admitted for OT/PT; xarelto for prophylaxis  Postoperative anemia due to acute blood loss SNF - Hb dropped fro 10.2 to 7.2; sis not receive tx; will f/u CBC`  Arthritis SNF - pt used advil prior;will make do with tylenol  for now  Constipation SNF - colace BID and prn miralax  Headache SNF - normally tx with advil, will make do with tylenol until xarelto is stopped   Time spent > 35 min;> 50% of time with patient was spent reviewing records, labs, tests and studies, counseling and developing plan of care  Margit Hanks, MD

## 2015-10-11 NOTE — Assessment & Plan Note (Addendum)
SNF - for prior replacement that failed; admitted for OT/PT; xarelto for prophylaxis

## 2015-10-11 NOTE — Assessment & Plan Note (Signed)
SNF - normally tx with advil, will make do with tylenol until xarelto is stopped

## 2015-10-11 NOTE — Assessment & Plan Note (Addendum)
SNF - Hb dropped fro 10.2 to 7.2; did not receive tx; will f/u CBC`

## 2015-10-11 NOTE — Assessment & Plan Note (Addendum)
SNF - pt used advil prior;will make do with tylenol  for now

## 2015-10-11 NOTE — Assessment & Plan Note (Signed)
SNF - colace BID and prn miralax

## 2015-10-15 LAB — CBC AND DIFFERENTIAL
HCT: 32 % — AB (ref 36–46)
Hemoglobin: 10.5 g/dL — AB (ref 12.0–16.0)
PLATELETS: 370 10*3/uL (ref 150–399)
WBC: 8.8 10*3/mL

## 2015-10-15 LAB — BASIC METABOLIC PANEL
BUN: 16 mg/dL (ref 4–21)
CREATININE: 0.6 mg/dL (ref <=1.1)
Glucose: 48 mg/dL
Potassium: 4.3 mmol/L (ref 3.4–5.3)
Sodium: 139 mmol/L (ref 137–147)

## 2015-10-20 ENCOUNTER — Other Ambulatory Visit: Payer: Self-pay

## 2015-10-20 MED ORDER — HYDROMORPHONE HCL 2 MG PO TABS: 2.0000 mg | ORAL_TABLET | ORAL | Status: DC | PRN

## 2015-10-29 ENCOUNTER — Non-Acute Institutional Stay (SKILLED_NURSING_FACILITY): Payer: BLUE CROSS/BLUE SHIELD | Admitting: Internal Medicine

## 2015-10-29 ENCOUNTER — Encounter: Payer: Self-pay | Admitting: Internal Medicine

## 2015-10-29 DIAGNOSIS — Z96652 Presence of left artificial knee joint: Secondary | ICD-10-CM | POA: Diagnosis not present

## 2015-10-29 DIAGNOSIS — D62 Acute posthemorrhagic anemia: Secondary | ICD-10-CM | POA: Diagnosis not present

## 2015-10-29 DIAGNOSIS — E669 Obesity, unspecified: Secondary | ICD-10-CM | POA: Diagnosis not present

## 2015-10-29 DIAGNOSIS — R51 Headache: Secondary | ICD-10-CM

## 2015-10-29 DIAGNOSIS — K5901 Slow transit constipation: Secondary | ICD-10-CM

## 2015-10-29 DIAGNOSIS — R519 Headache, unspecified: Secondary | ICD-10-CM

## 2015-10-29 DIAGNOSIS — M1990 Unspecified osteoarthritis, unspecified site: Secondary | ICD-10-CM

## 2015-10-29 NOTE — Progress Notes (Signed)
MRN: [id number] Name: Lauren Frye  Sex: female Age: 65 y.o. DOB: 1951-02-14  PSC #: Lauren Frye Facility/Room: Level Of Care: SNF Provider: Merrilee Seashore D Emergency Contacts: Extended Emergency Contact Information Primary Emergency Contact: Farris Has, [place] [address] [place] Home Phone: [phone] Relation: Friend  Code Status: DNR Allergies: Codeine; Penicillins; Propoxyphene; and Prednisone  Chief Complaint  Patient presents with  . Discharge Note    HPI: Patient is 65 y.o. female who was admitted to [facility] from 4/4- 6/17 and found to have a diagnosis of LEFT KNEE OSTEOARTHRITIS. After appropriate laboratory studies were obtainedshe was taken to the operating room on 10/07/2015 and underwent Procedure(s):TOTAL KNEE ARTHROPLASTY Left. Hospital course was complicated by a drop in Hb which did not, barely, require a transfusion. Pt was admitted to SNF for OT/PT and is no ready to be d/c to home.  Past Medical History  Diagnosis Date  . Depression   . Headache   . Arthritis     Past Surgical History  Procedure Laterality Date  . Abdominal hysterectomy    . Bowel resection    . Tonsillectomy    . Ankle fusion    . Knee surgery Bilateral     LEFT ARTHROCOPY  X3    ,  RT X1  . Total knee arthroplasty Left 10/07/2015  . Total knee arthroplasty Left 10/07/2015    Procedure: TOTAL KNEE ARTHROPLASTY;  Surgeon: Valeria Batman, MD;  Location: [facility];  Service: Orthopedics;  Laterality: Left;      Medication List       This list is accurate as of: 10/29/15 12:31 PM.  Always use your most recent med list.               Biotin 5000 MCG Tabs  Take 1 tablet by mouth daily.     HYDROmorphone 2 MG tablet  Commonly known as:  DILAUDID  Take 1-2 tablets (2-4 mg total) by mouth every 4 (four) hours as needed for severe pain (1 - 2 TABLETS Q 4H PRN PAIN).     methocarbamol 500 MG tablet  Commonly known as:  ROBAXIN  Take 1 tablet (500  mg total) by mouth every 6 (six) hours as needed for muscle spasms.     PROBIOTIC DAILY PO  Take 1 capsule by mouth daily.     STRESS FORMULA Tabs  Take 1 tablet by mouth daily.        No orders of the defined types were placed in this encounter.     There is no immunization history on file for this patient.  Social History  Substance Use Topics  . Smoking status: Never Smoker   . Smokeless tobacco: Never Used  . Alcohol Use: 0.6 oz/week    1 Glasses of wine per week     Comment: once week    Filed Vitals:   10/29/15 1038  BP: 109/78  Pulse: 56  Temp: 97.3 F (36.3 C)  Resp: 20    Physical Exam  GENERAL APPEARANCE: Alert, conversant. No acute distress.  HEENT: Unremarkable. RESPIRATORY: Breathing is even, unlabored. Lung sounds are clear   CARDIOVASCULAR: Heart RRR no murmurs, rubs or gallops. No peripheral edema.  GASTROINTESTINAL: Abdomen is soft, non-tender, not distended w/ normal bowel sounds.  NEUROLOGIC: Cranial nerves 2-12 grossly intact. Moves all extremities  Patient Active Problem List   Diagnosis Date Noted  . Postoperative anemia due to acute blood loss  10/11/2015  . Arthritis 10/11/2015  . Constipation 10/11/2015  . Headache 10/11/2015  . Osteoarthritis of left knee 10/07/2015  . Obesity 10/07/2015  . S/P total knee replacement using cement 10/07/2015    CBC    Component Value Date/Time   WBC 8.8 10/15/2015   WBC 7.9 10/10/2015 0448   RBC 3.48* 10/10/2015 0448   HGB 10.5* 10/15/2015   HCT 32* 10/15/2015   PLT 370 10/15/2015   MCV 90.8 10/10/2015 0448   LYMPHSABS 2.0 09/24/2015 1013   MONOABS 0.6 09/24/2015 1013   EOSABS 0.2 09/24/2015 1013   BASOSABS 0.0 09/24/2015 1013    CMP     Component Value Date/Time   NA 139 10/15/2015   NA 138 10/10/2015 0448   K 4.3 10/15/2015   CL 101 10/10/2015 0448   CO2 26 10/10/2015 0448   GLUCOSE 116* 10/10/2015 0448   BUN 16 10/15/2015   BUN 7 10/10/2015 0448   CREATININE 0.6 10/15/2015    CREATININE 0.68 10/10/2015 0448   CALCIUM 8.4* 10/10/2015 0448   PROT 6.7 09/24/2015 1013   ALBUMIN 4.0 09/24/2015 1013   AST 21 09/24/2015 1013   ALT 25 09/24/2015 1013   ALKPHOS 73 09/24/2015 1013   BILITOT 0.5 09/24/2015 1013   GFRNONAA >60 10/10/2015 0448   GFRAA >60 10/10/2015 0448    Assessment and Plan  Pt is d/c to home with HH/OT/PT/nursing. Medications have been reconciled and rx's written.   Tme spent > 30 min;> 50% of time with patient was spent reviewing records, labs, tests and studies, counseling and developing plan of care  Merrilee SeashoreAlexander, Penney Domanski, MD

## 2015-10-30 ENCOUNTER — Ambulatory Visit: Payer: BLUE CROSS/BLUE SHIELD | Attending: Orthopaedic Surgery | Admitting: Physical Therapy

## 2015-10-30 DIAGNOSIS — R2689 Other abnormalities of gait and mobility: Secondary | ICD-10-CM | POA: Insufficient documentation

## 2015-10-30 DIAGNOSIS — M25662 Stiffness of left knee, not elsewhere classified: Secondary | ICD-10-CM | POA: Insufficient documentation

## 2015-10-30 DIAGNOSIS — M25562 Pain in left knee: Secondary | ICD-10-CM | POA: Insufficient documentation

## 2015-10-30 DIAGNOSIS — R262 Difficulty in walking, not elsewhere classified: Secondary | ICD-10-CM | POA: Diagnosis present

## 2015-10-31 NOTE — Therapy (Signed)
[facility] [address]  [address] [place], [place], [address] Phone: [phone]   Fax:  [phone]  Physical Therapy Evaluation  Patient Details  Name: Lauren AguasSusan Frye MRN: [id number] Date of Birth: Mar 29, 1951 Referring Provider: Norlene CampbellPeter Whitfield, MD  Encounter Date: 10/30/2015      PT End of Session - 10/30/15 1452    Visit Number 1   Number of Visits 20   Date for PT Re-Evaluation 12/25/15   PT Start Time 1400   PT Stop Time 1450   PT Time Calculation (min) 50 min   Activity Tolerance Patient tolerated treatment well   Behavior During Therapy [facility] for tasks assessed/performed      Past Medical History  Diagnosis Date  . Depression   . Headache   . Arthritis     Past Surgical History  Procedure Laterality Date  . Abdominal hysterectomy    . Bowel resection    . Tonsillectomy    . Ankle fusion    . Knee surgery Bilateral     LEFT ARTHROCOPY  X3    ,  RT X1  . Total knee arthroplasty Left 10/07/2015  . Total knee arthroplasty Left 10/07/2015    Procedure: TOTAL KNEE ARTHROPLASTY;  Surgeon: Valeria BatmanPeter W Whitfield, MD;  [facility];  Service: Orthopedics;  Laterality: Left;    There were no vitals filed for this visit.       Subjective Assessment - 10/30/15 1404    Subjective Pt underwent L TKR on 10/07/15 with 3 day acute care stay followed by rehab at SNF until yesterday. Continuing to use CPM at home for 1 more week. States MD planning for manipulation if flexion ROM not >100 dg by the next she sees him in the office.   Pertinent History L TKR 10/07/15    Limitations Sitting   How long can you sit comfortably? 10-15 minutes   How long can you stand comfortably? 15 minutes   How long can you walk comfortably? only short distances (<500 ft)   Patient Stated Goals "I want the range at 120 dg like they say I should."   Currently in Pain? Yes   Pain Score 6    Pain Location Knee   Pain Orientation Left   Pain  Descriptors / Indicators Aching   Pain Type Surgical pain   Pain Onset 1 to 4 weeks ago   Pain Frequency Intermittent   Aggravating Factors  Dependent position, standing and walking   Pain Relieving Factors Pain meds (MD doesn't want her taking prescription meds any longer), muscle relaxant, ice   Effect of Pain on Daily Activities Pain limits mobility, walking and all household activities            [facility] PT Assessment - 10/30/15 1400    Assessment   Medical Diagnosis L TKR   Referring Provider Norlene CampbellPeter Whitfield, MD   Onset Date/Surgical Date 10/07/15   Next MD Visit 11/03/15   Prior Therapy PT at SNF for rehab   Balance Screen   Has the patient fallen in the past 6 months No   Has the patient had a decrease in activity level because of a fear of falling?  No   Is the patient reluctant to leave their home because of a fear of falling?  No   Home Environment   Living Environment Private residence   Living Arrangements Alone   Type of Home House   Home Access Stairs to enter  Entrance Stairs-Number of Steps 5   Entrance Stairs-Rails Right;Left;Can reach both   Home Equipment Walker - 2 wheels;Shower seat  elevated commode   Prior Function   Level of Independence Independent   Vocation Full time employment   Fish farm manager, mostly sedentary   Leisure Gym 3x/wk, gardening   Observation/Other Assessments   Focus on Therapeutic Outcomes (FOTO)  Knee - 35% (65% limitation); Predicted 55% (45% limitation)   ROM / Strength   AROM / PROM / Strength AROM;PROM;Strength   AROM   AROM Assessment Site Knee   Right/Left Knee Left;Right   Right Knee Extension 0   Right Knee Flexion 124   Left Knee Extension 14   Left Knee Flexion 92   PROM   Overall PROM Comments Measurements taken w/o warm-up -- Pt reporting 109 dg flexion after exercises prior to D/C from rehab   PROM Assessment Site Knee   Right/Left Knee Left   Left Knee Extension 0   Left Knee Flexion 101    Strength   Strength Assessment Site Hip;Knee   Right/Left Hip Right;Left   Right Hip Flexion 4/5   Right Hip Extension 4/5   Right Hip ABduction 4/5   Left Hip Flexion 4-/5   Left Hip Extension 3+/5   Left Hip ABduction 4-/5   Right/Left Knee Right;Left   Right Knee Flexion 4/5   Right Knee Extension 4+/5   Left Knee Flexion 3+/5   Left Knee Extension 4-/5   Flexibility   Soft Tissue Assessment /Muscle Length yes   Hamstrings B tightness L >> R   Quadriceps Tight on L   Palpation   Patella mobility limited all directions   Ambulation/Gait   Assistive device Rolling walker   Gait Pattern Step-through pattern;Decreased step length - left;Decreased stance time - left;Decreased hip/knee flexion - left;Decreased weight shift to left  B ER at hips   Gait velocity decreased         Today's Treatment  TherEx Prone quad stretch with strap 2x30" Supine hamstring stretch with strap 2x30" Hooklying Hip ADD ball squeeze 10x5" 3 way SLR (flexion/abduction/extension) x10           PT Education - 10/30/15 1727    Education provided Yes   Education Details PT eval findings, POC and additions to existing HEP   Person(s) Educated Patient   Methods Explanation;Demonstration;Handout   Comprehension Verbalized understanding;Returned demonstration;Need further instruction          PT Short Term Goals - 10/30/15 1445    PT SHORT TERM GOAL #1   Title Pt will be independent with initial HEP by 11/27/15   Status New   PT SHORT TERM GOAL #2   Title Pt will ambulate with normal gait pattern with SPC by 11/27/15   Status New           PT Long Term Goals - 10/30/15 1446    PT LONG TERM GOAL #1   Title Pt will be independent with advanced HEP/gym program by 12/25/15   Status New   PT LONG TERM GOAL #2   Title Pt will demonstrate L knee AROM 3-120 or greater by 12/25/15   Status New   PT LONG TERM GOAL #3   Title Pt will demonstrate L LE strength 4+/5 or greater by 12/25/15    Status New   PT LONG TERM GOAL #4   Title Pt wil ambulate with normal gait pattern on all surfaces w/o AD by 12/25/15   Status  New   PT LONG TERM GOAL #5   Title Pt will negotiate stairs with good reciprocal gait pattern by 12/25/15   Status New               Plan - 10/30/15 1723    Clinical Impression Statement Ms. Obi is a 65 y/o female who presents to OP PT 23 days s/p L TKR on 10/07/15. After acute stay pt, pt underwent 3 wks of rehab at SNF, discharging to home yesterday. Pt presents to therapy today continuing to use to RW with antalgic gait pattern with decreased step length, decreased hip and knee flexion during swing through, and decreased heel strike and knee extension on weight acceptance with mild hip ER bilaterally. Pain currently 6/10 with pt reporting pain significantly limiting tolerance for standing, walking and sitting with leg in dependent position. Assessment reveals L knee AROM 14-92 and PROM 0-101 (pt reports flexion ROM was 109 upon D/C from SNF rehab earlier this week, but measured after exercises to loosen up knee). Tightness noted in B hamstrings and L quads, along with decreased patellar mobility. L hip strength 4-/5 except extension 3+/5, and knee 3+/5 for flexion and 4-/5 for extension within available range.    Rehab Potential Good   PT Frequency 3x / week  probable decrease to 2x/wk after 3 wks   PT Duration 8 weeks   PT Treatment/Interventions Patient/family education;Therapeutic exercise;Manual techniques;Passive range of motion;Scar mobilization;Therapeutic activities;Functional mobility training;Gait training;Stair training;Cryotherapy;Vasopneumatic Device;Electrical Stimulation;ADLs/Self Care Home Management   PT Next Visit Plan Review HEP; L knee A/PROM with stretching and joint mobs PRN; B LE strengthening with emphasis on L TKE and hamstring strength; Modalities PRN for pain and edema    Consulted and Agree with Plan of Care Patient      Patient will  benefit from skilled therapeutic intervention in order to improve the following deficits and impairments:  Pain, Decreased range of motion, Impaired flexibility, Decreased strength, Abnormal gait, Difficulty walking, Decreased activity tolerance, Decreased balance, Decreased endurance, Decreased scar mobility, Increased edema  Visit Diagnosis: Pain in left knee  Stiffness of left knee, not elsewhere classified  Difficulty in walking, not elsewhere classified  Other abnormalities of gait and mobility     Problem List Patient Active Problem List   Diagnosis Date Noted  . Postoperative anemia due to acute blood loss 10/11/2015  . Arthritis 10/11/2015  . Constipation 10/11/2015  . Headache 10/11/2015  . Osteoarthritis of left knee 10/07/2015  . Obesity 10/07/2015  . S/P total knee replacement using cement 10/07/2015    Marry Guan, PT, MPT 10/31/2015, 10:13 AM  [facility] [address]  [address] [place], [place], [address] Phone: [phone]   Fax:  [phone]  Name: Casee Knepp MRN: [id number] Date of Birth: 22-Jun-1951

## 2015-11-04 ENCOUNTER — Ambulatory Visit: Payer: BLUE CROSS/BLUE SHIELD | Attending: Orthopaedic Surgery

## 2015-11-04 DIAGNOSIS — R262 Difficulty in walking, not elsewhere classified: Secondary | ICD-10-CM | POA: Diagnosis present

## 2015-11-04 DIAGNOSIS — R2689 Other abnormalities of gait and mobility: Secondary | ICD-10-CM | POA: Diagnosis present

## 2015-11-04 DIAGNOSIS — M25562 Pain in left knee: Secondary | ICD-10-CM | POA: Diagnosis not present

## 2015-11-04 DIAGNOSIS — M25662 Stiffness of left knee, not elsewhere classified: Secondary | ICD-10-CM | POA: Insufficient documentation

## 2015-11-04 NOTE — Therapy (Signed)
[facility] [address]  [address] [place], [place], [address] Phone: [phone]   Fax:  [phone]  Physical Therapy Treatment  Patient Details  Name: Lauren Frye MRN: [id number] Date of Birth: 1951-07-02 Referring Provider: [name], MD  Encounter Date: 11/04/2015      PT End of Session - 11/04/15 1103    Visit Number 2   Number of Visits 20   Date for PT Re-Evaluation 12/25/15   PT Start Time 1015   PT Stop Time 1110   PT Time Calculation (min) 55 min   Activity Tolerance Patient tolerated treatment well   Behavior During Therapy [facility] for tasks assessed/performed      Past Medical History  Diagnosis Date  . Depression   . Headache   . Arthritis     Past Surgical History  Procedure Laterality Date  . Abdominal hysterectomy    . Bowel resection    . Tonsillectomy    . Ankle fusion    . Knee surgery Bilateral     LEFT ARTHROCOPY  X3    ,  RT X1  . Total knee arthroplasty Left 10/07/2015  . Total knee arthroplasty Left 10/07/2015    Procedure: TOTAL KNEE ARTHROPLASTY;  Surgeon: Valeria Batman, MD;  Location: [facility] OR;  Service: Orthopedics;  Laterality: Left;    There were no vitals filed for this visit.      Subjective Assessment - 11/04/15 1017    Subjective Pt. reports she has no pain today however reports "6/10 stiffness"   Patient Stated Goals "I want the range at 120 dg like they say I should."   Currently in Pain? No/denies   Pain Score 0-No pain   Multiple Pain Sites No            [facility] PT Assessment - 11/04/15 1040    AROM   Left Knee Extension 14   Left Knee Flexion 96   PROM   Left Knee Extension 2   Left Knee Flexion 104     Today Treatment:  Therex: NuStep: level 3, 4 min L HS, knee flexion stretch x 30 sec each   Manual: L knee Flexion / ext. mobs   Therex: Bridging x 15 reps  Bridging with hip isometric / ER with green TB 3 x 5 reps B sidelying clam shell  with green TB  Standing TKE with black TB in door x 15 reps; 2 UE support Seated fitter leg press (1 blue / 1 black) 2 x 10 reps   Gait training: Gait with single point cane, supervision from therapist x 5 laps on gym track; frequent verbal cues provided for proper sequencing, upright posture, reciprocal arm swing   Vasoneumatic Device: medium compression, coldest temp., supine, L LE elevated, 10',          PT Education - 11/04/15 1215    Education provided Yes   Education Details bridging with B hip abd/ER with green TB   Person(s) Educated Patient   Methods Handout;Explanation;Verbal cues   Comprehension Verbalized understanding          PT Short Term Goals - 11/04/15 1032    PT SHORT TERM GOAL #1   Title Pt will be independent with initial HEP by 11/27/15   Status Achieved  11/04/15: Pt. independent with initial HEP.     PT SHORT TERM GOAL #2   Title Pt will ambulate with normal gait pattern with SPC by 11/27/15  Status On-going           PT Long Term Goals - 11/04/15 1220    PT LONG TERM GOAL #1   Title Pt will be independent with advanced HEP/gym program by 12/25/15   Status On-going   PT LONG TERM GOAL #2   Title Pt will demonstrate L knee AROM 3-120 or greater by 12/25/15   Status On-going   PT LONG TERM GOAL #3   Title Pt will demonstrate L LE strength 4+/5 or greater by 12/25/15   Status On-going   PT LONG TERM GOAL #4   Title Pt wil ambulate with normal gait pattern on all surfaces w/o AD by 12/25/15   Status On-going   PT LONG TERM GOAL #5   Title Pt will negotiate stairs with good reciprocal gait pattern by 12/25/15   Status On-going               Plan - 11/04/15 1217    Clinical Impression Statement Pt. tolerated all hip / knee strengthening well today with a focus on today treatment of TKE strengthening activity; pt. recalled all HEP activities well and is complaint and motivated; bridging with B hip / ER with band added today to HEP activities.   Pt. continues to improve with L knee ROM able to achieve  AROM 14-96 dg; L knee PROM 2-104 dg.     PT Treatment/Interventions Patient/family education;Therapeutic exercise;Manual techniques;Passive range of motion;Scar mobilization;Therapeutic activities;Functional mobility training;Gait training;Stair training;Cryotherapy;Vasopneumatic Device;Electrical Stimulation;ADLs/Self Care Home Management   PT Next Visit Plan L knee A/PROM with stretching and joint mobs PRN; B LE strengthening with emphasis on L TKE and hamstring strength; Modalities PRN for pain and edema       Patient will benefit from skilled therapeutic intervention in order to improve the following deficits and impairments:  Pain, Decreased range of motion, Impaired flexibility, Decreased strength, Abnormal gait, Difficulty walking, Decreased activity tolerance, Decreased balance, Decreased endurance, Decreased scar mobility, Increased edema  Visit Diagnosis: Pain in left knee  Stiffness of left knee, not elsewhere classified  Difficulty in walking, not elsewhere classified  Other abnormalities of gait and mobility     Problem List Patient Active Problem List   Diagnosis Date Noted  . Postoperative anemia due to acute blood loss 10/11/2015  . Arthritis 10/11/2015  . Constipation 10/11/2015  . Headache 10/11/2015  . Osteoarthritis of left knee 10/07/2015  . Obesity 10/07/2015  . S/P total knee replacement using cement 10/07/2015    Kermit BaloMicah Shyan Scalisi, PTA 11/04/2015, 12:21 PM  [facility] [place] [address]  [address] [place], [place], [address] Phone: [phone]   Fax:  [phone]  Name: Lauren AguasSusan Frye MRN: [id number] Date of Birth: 1951/01/28

## 2015-11-05 ENCOUNTER — Ambulatory Visit: Payer: BLUE CROSS/BLUE SHIELD

## 2015-11-05 DIAGNOSIS — M25662 Stiffness of left knee, not elsewhere classified: Secondary | ICD-10-CM

## 2015-11-05 DIAGNOSIS — R262 Difficulty in walking, not elsewhere classified: Secondary | ICD-10-CM

## 2015-11-05 DIAGNOSIS — M25562 Pain in left knee: Secondary | ICD-10-CM

## 2015-11-05 DIAGNOSIS — R2689 Other abnormalities of gait and mobility: Secondary | ICD-10-CM

## 2015-11-05 NOTE — Therapy (Signed)
[facility] [place] [address]  [address] [place], [place], [address] Phone: [phone]   Fax:  [phone]  Physical Therapy Treatment  Patient Details  Name: Lauren Frye MRN: [id number] Date of Birth: 07/10/1950 Referring Provider: Norlene Campbellpeter whitfield, MD  Encounter Date: 11/05/2015      PT End of Session - 11/05/15 1137    Visit Number 3   Number of Visits 20   Date for PT Re-Evaluation 12/25/15   PT Start Time 1105   PT Stop Time 1200   PT Time Calculation (min) 55 min   Activity Tolerance Patient tolerated treatment well   Behavior During Therapy [facility] for tasks assessed/performed      Past Medical History  Diagnosis Date  . Depression   . Headache   . Arthritis     Past Surgical History  Procedure Laterality Date  . Abdominal hysterectomy    . Bowel resection    . Tonsillectomy    . Ankle fusion    . Knee surgery Bilateral     LEFT ARTHROCOPY  X3    ,  RT X1  . Total knee arthroplasty Left 10/07/2015  . Total knee arthroplasty Left 10/07/2015    Procedure: TOTAL KNEE ARTHROPLASTY;  Surgeon: Valeria BatmanPeter W Whitfield, MD;  [facility];  Service: Orthopedics;  Laterality: Left;    There were no vitals filed for this visit.      Subjective Assessment - 11/05/15 1113    Subjective Pt. reports continued stiffnesstoday and L hip soreness following yesterday's treatment.     Patient Stated Goals "I want the range at 120 dg like they say I should."   Currently in Pain? No/denies   Pain Score 0-No pain   Multiple Pain Sites No            [facility] PT Assessment - 11/05/15 0001    Assessment   Medical Diagnosis L TKR   Referring Provider Norlene Campbellpeter whitfield, MD   Next MD Visit 12/08/15      Today Treatment:  Therex: NuStep: level 3, 4 min L HS, knee flexion stretch x 30 sec each   Manual: L knee Flexion / ext. mobs   Therex: Bridging x 15 reps, 10 reps L sidelying R hip clam shell with blue  TB Bridge with heels  on the peanut p-ball x 10 reps Standing L TKE with black TB in door x 15 reps; 2 UE support L Seated fitter leg press (1 blue / 1 black) 2 x 10 reps  4" step up with black TB around knee x 8 reps; 2 UE support; terminated due to pt. having difficulty with this  Gait training: Gait with single point cane, supervision from therapist x 1 laps on gym track; frequent verbal cues provided for proper sequencing, upright posture, reciprocal arm swing   Vasoneumatic Device: medium compression, coldest temp., supine, L LE elevated, 15',         PT Education - 11/04/15 1215    Education provided Yes   Education Details bridging with B hip abd/ER with green TB   Person(s) Educated Patient   Methods Handout;Explanation;Verbal cues   Comprehension Verbalized understanding          PT Short Term Goals - 11/04/15 1032    PT SHORT TERM GOAL #1   Title Pt will be independent with initial HEP by 11/27/15   Status Achieved  11/04/15: Pt. independent with initial HEP.     PT SHORT TERM  GOAL #2   Title Pt will ambulate with normal gait pattern with SPC by 11/27/15   Status On-going           PT Long Term Goals - 11/04/15 1220    PT LONG TERM GOAL #1   Title Pt will be independent with advanced HEP/gym program by 12/25/15   Status On-going   PT LONG TERM GOAL #2   Title Pt will demonstrate L knee AROM 3-120 or greater by 12/25/15   Status On-going   PT LONG TERM GOAL #3   Title Pt will demonstrate L LE strength 4+/5 or greater by 12/25/15   Status On-going   PT LONG TERM GOAL #4   Title Pt wil ambulate with normal gait pattern on all surfaces w/o AD by 12/25/15   Status On-going   PT LONG TERM GOAL #5   Title Pt will negotiate stairs with good reciprocal gait pattern by 12/25/15   Status On-going               Plan - 11/05/15 1149    Clinical Impression Statement Pt. tolerated all hip ' knee strengthening activity today well with exception of 4" step up with TB around knee; this  activity terminated due to pt. vaulting and excessive difficulty.  Pt. reported mild soreness in L hip from clam shell activities performed in yesterday's treatment.     PT Treatment/Interventions Patient/family education;Therapeutic exercise;Manual techniques;Passive range of motion;Scar mobilization;Therapeutic activities;Functional mobility training;Gait training;Stair training;Cryotherapy;Vasopneumatic Device;Electrical Stimulation;ADLs/Self Care Home Management   PT Next Visit Plan L knee A/PROM with stretching and joint mobs PRN; B LE strengthening with emphasis on L TKE and hamstring strength; Modalities PRN for pain and edema       Patient will benefit from skilled therapeutic intervention in order to improve the following deficits and impairments:  Pain, Decreased range of motion, Impaired flexibility, Decreased strength, Abnormal gait, Difficulty walking, Decreased activity tolerance, Decreased balance, Decreased endurance, Decreased scar mobility, Increased edema  Visit Diagnosis: Pain in left knee  Stiffness of left knee, not elsewhere classified  Difficulty in walking, not elsewhere classified  Other abnormalities of gait and mobility     Problem List Patient Active Problem List   Diagnosis Date Noted  . Postoperative anemia due to acute blood loss 10/11/2015  . Arthritis 10/11/2015  . Constipation 10/11/2015  . Headache 10/11/2015  . Osteoarthritis of left knee 10/07/2015  . Obesity 10/07/2015  . S/P total knee replacement using cement 10/07/2015    Kermit Balo, PTA 11/05/2015, 12:09 PM  [facility] [address]  [address] [place], [place], [address] Phone: [phone]   Fax:  [phone]  Name: Lauren Frye MRN: [id number] Date of Birth: 1951-05-14

## 2015-11-07 ENCOUNTER — Ambulatory Visit: Payer: BLUE CROSS/BLUE SHIELD | Admitting: Physical Therapy

## 2015-11-07 DIAGNOSIS — M25662 Stiffness of left knee, not elsewhere classified: Secondary | ICD-10-CM

## 2015-11-07 DIAGNOSIS — M25562 Pain in left knee: Secondary | ICD-10-CM

## 2015-11-07 DIAGNOSIS — R2689 Other abnormalities of gait and mobility: Secondary | ICD-10-CM

## 2015-11-07 NOTE — Therapy (Signed)
[facility] [place] [address]  [address] [place], [place], [address] Phone: [phone]   Fax:  [phone]  Physical Therapy Treatment  Patient Details  Name: Lauren AguasSusan Frye MRN: [id number] Date of Birth: 1951-04-22 Referring Provider: Dr. Cleophas DunkerWhitfield   Encounter Date: 11/07/2015      PT End of Session - 11/07/15 0847    Visit Number 4   Number of Visits 20   Date for PT Re-Evaluation 12/25/15   PT Start Time 0802   PT Stop Time 0900   PT Time Calculation (min) 58 min      Past Medical History  Diagnosis Date  . Depression   . Headache   . Arthritis     Past Surgical History  Procedure Laterality Date  . Abdominal hysterectomy    . Bowel resection    . Tonsillectomy    . Ankle fusion    . Knee surgery Bilateral     LEFT ARTHROCOPY  X3    ,  RT X1  . Total knee arthroplasty Left 10/07/2015  . Total knee arthroplasty Left 10/07/2015    Procedure: TOTAL KNEE ARTHROPLASTY;  Surgeon: Valeria BatmanPeter W Whitfield, MD;  [facility];  Service: Orthopedics;  Laterality: Left;    There were no vitals filed for this visit.      Subjective Assessment - 11/07/15 1047    Subjective Pt reports she continued stiffness in Lt knee.  Pt continues to use CPM machine at home throughout day.      Currently in Pain? No/denies  "just stiff"             [facility] - 11/07/15 0001    Assessment   Medical Diagnosis L TKR   Referring Provider Dr. Cleophas DunkerWhitfield    Next MD Visit 12/08/15   Prior Therapy PT at SNF for rehab   AROM   AROM Assessment Site Knee   Right/Left Knee Left   Left Knee Flexion 100  with seated scoot          [facility] Adult PT Treatment/Exercise - 11/07/15 0001    Self-Care   Self-Care Other Self-Care Comments   Exercises   Exercises Knee/Hip   Knee/Hip Exercises: Stretches   Passive Hamstring Stretch Left;3 reps;30 seconds  supine with strap, overpressure from hand.    Gastroc Stretch Left;30 seconds;3  reps   Soleus Stretch Left;1 rep;30 seconds   Knee/Hip Exercises: Aerobic   Nustep L4: (arms/legs) x 5 min    Knee/Hip Exercises: Standing   Heel Raises Both;1 set;10 reps   Heel Raises Limitations tactile cues to shift pt to equal weight between feet.    Other Standing Knee Exercises sit to/from stand to low mat x 10, VC for even wt between feet and controlled descent.    Other Standing Knee Exercises Standing weight shifts side to side with Light UE support on counter; forward/backward weight shifts with light UE support on counter (unsteady with Lt stance, but no LOB)    Knee/Hip Exercises: Seated   Other Seated Knee/Hip Exercises seated scoots to increase Lt knee ROM x 10 with 5 sec hold, VC for form and to avoid bouncing   Modalities   Modalities Vasopneumatic   Vasopneumatic   Number Minutes Vasopneumatic  15 minutes   Vasopnuematic Location  Knee  left   Vasopneumatic Pressure Medium   Vasopneumatic Temperature  3 snowflakes   Manual Therapy   Manual Therapy Taping:  Ktape applied in web pattern across  front of Lt knee to assist in edema and pain reduction.           PT Short Term Goals - 11/04/15 1032    PT SHORT TERM GOAL #1   Title Pt will be independent with initial HEP by 11/27/15   Status Achieved  11/04/15: Pt. independent with initial HEP.     PT SHORT TERM GOAL #2   Title Pt will ambulate with normal gait pattern with SPC by 11/27/15   Status On-going           PT Long Term Goals - 11/04/15 1220    PT LONG TERM GOAL #1   Title Pt will be independent with advanced HEP/gym program by 12/25/15   Status On-going   PT LONG TERM GOAL #2   Title Pt will demonstrate L knee AROM 3-120 or greater by 12/25/15   Status On-going   PT LONG TERM GOAL #3   Title Pt will demonstrate L LE strength 4+/5 or greater by 12/25/15   Status On-going   PT LONG TERM GOAL #4   Title Pt wil ambulate with normal gait pattern on all surfaces w/o AD by 12/25/15   Status On-going   PT  LONG TERM GOAL #5   Title Pt will negotiate stairs with good reciprocal gait pattern by 12/25/15   Status On-going               Plan - 11/07/15 1044    Clinical Impression Statement Pt demonstrated difficulty with standing weight shifting activities due to increased pain and reported fear of Lt knee buckling with Lt stance.  Trial of ktape across knee for edema and pain initiated today.  Pt making gradual progress towards goals.    Rehab Potential Good   PT Frequency 3x / week   PT Duration 8 weeks   PT Treatment/Interventions Patient/family education;Therapeutic exercise;Manual techniques;Passive range of motion;Scar mobilization;Therapeutic activities;Functional mobility training;Gait training;Stair training;Cryotherapy;Vasopneumatic Device;Electrical Stimulation;ADLs/Self Care Home Management   PT Next Visit Plan Assess response to ktape.  Continue progressive ROM and strengthening to Lt knee.  Modalities as indicated.    Consulted and Agree with Plan of Care Patient      Patient will benefit from skilled therapeutic intervention in order to improve the following deficits and impairments:  Pain, Decreased range of motion, Impaired flexibility, Decreased strength, Abnormal gait, Difficulty walking, Decreased activity tolerance, Decreased balance, Decreased endurance, Decreased scar mobility, Increased edema  Visit Diagnosis: Pain in left knee  Stiffness of left knee, not elsewhere classified  Other abnormalities of gait and mobility     Problem List Patient Active Problem List   Diagnosis Date Noted  . Postoperative anemia due to acute blood loss 10/11/2015  . Arthritis 10/11/2015  . Constipation 10/11/2015  . Headache 10/11/2015  . Osteoarthritis of left knee 10/07/2015  . Obesity 10/07/2015  . S/P total knee replacement using cement 10/07/2015   Mayer Camel, PTA 11/07/2015 10:51 AM  [facility] [address]  [address] [place], [place], [address] Phone: [phone]   Fax:  [phone]  Name: Lauren Frye MRN: [id number] Date of Birth: 1950/08/21

## 2015-11-10 ENCOUNTER — Ambulatory Visit: Payer: BLUE CROSS/BLUE SHIELD

## 2015-11-10 DIAGNOSIS — M25562 Pain in left knee: Secondary | ICD-10-CM | POA: Diagnosis not present

## 2015-11-10 DIAGNOSIS — M25662 Stiffness of left knee, not elsewhere classified: Secondary | ICD-10-CM

## 2015-11-10 DIAGNOSIS — R262 Difficulty in walking, not elsewhere classified: Secondary | ICD-10-CM

## 2015-11-10 DIAGNOSIS — R2689 Other abnormalities of gait and mobility: Secondary | ICD-10-CM

## 2015-11-10 NOTE — Therapy (Signed)
[place] [address]  [place], [place], [address] Phone: [phone]   Fax:  [phone]  Physical Therapy Treatment  Patient Details  Name: Lauren Frye MRN: [id number] Date of Birth: 1950/10/11 Referring Provider: Dr. Durward Fortes   Encounter Date: 11/10/2015      PT End of Session - 11/10/15 0820    Visit Number 5   Number of Visits 20   Date for PT Re-Evaluation 12/25/15   PT Start Time 0800   PT Stop Time 0855   PT Time Calculation (min) 55 min   Activity Tolerance Patient tolerated treatment well   Behavior During Therapy [facility] for tasks assessed/performed      Past Medical History  Diagnosis Date  . Depression   . Headache   . Arthritis     Past Surgical History  Procedure Laterality Date  . Abdominal hysterectomy    . Bowel resection    . Tonsillectomy    . Ankle fusion    . Knee surgery Bilateral     LEFT ARTHROCOPY  X3    ,  RT X1  . Total knee arthroplasty Left 10/07/2015  . Total knee arthroplasty Left 10/07/2015    Procedure: TOTAL KNEE ARTHROPLASTY;  Surgeon: Garald Balding, MD;  Location: [facility];  Service: Orthopedics;  Laterality: Left;    There were no vitals filed for this visit.      Subjective Assessment - 11/10/15 0810    Subjective Pt. continue to report stiffness only today with no pain initially.  Pt. reports today is the last day she will be using the CPM.     Patient Stated Goals "I want the range at 120 dg like they say I should."   Currently in Pain? No/denies   Pain Score 0-No pain   Multiple Pain Sites No            [facility] PT Assessment - 11/10/15 0001    AROM   AROM Assessment Site Knee   Right/Left Knee Left   Left Knee Extension 8   Left Knee Flexion 102   PROM   Left Knee Flexion 106     Today Treatment:  Therex: NuStep: level 3, 4 min L HS, knee flexion stretch x 30 sec each   Manual: L knee Flexion / ext. mobs   Therex: Bridging x 15  reps, 10 reps R sidelying R hip clam shell with blue TB L Dorsiflexion rocker stretch x 30 sec  Standing L TKE with black TB in door x 15 reps; 2 UE support L Seated fitter leg press (1 blue / 1 black) x 10 reps  BATCA leg curl machine x 10 reps; B concentric, L eccentric 4" step up with black TB around knee x 10 reps; 1 UE support  Vasoneumatic Device: medium compression, coldest temp., supine, L LE elevated, 15'        PT Short Term Goals - 11/10/15 0824    PT SHORT TERM GOAL #1   Title Pt will be independent with initial HEP by 11/27/15   Status Achieved  11/04/15: Pt. independent with initial HEP.     PT SHORT TERM GOAL #2   Title Pt will ambulate with normal gait pattern with SPC by 11/27/15   Status Partially Met  11/10/15: pt. sequencing and gait pattern correct with SPC however pt. continues to demo significant antalgic gait with R wt. shift and decreased L LE stance time.  PT Long Term Goals - 11/04/15 1220    PT LONG TERM GOAL #1   Title Pt will be independent with advanced HEP/gym program by 12/25/15   Status On-going   PT LONG TERM GOAL #2   Title Pt will demonstrate L knee AROM 3-120 or greater by 12/25/15   Status On-going   PT LONG TERM GOAL #3   Title Pt will demonstrate L LE strength 4+/5 or greater by 12/25/15   Status On-going   PT LONG TERM GOAL #4   Title Pt wil ambulate with normal gait pattern on all surfaces w/o AD by 12/25/15   Status On-going   PT LONG TERM GOAL #5   Title Pt will negotiate stairs with good reciprocal gait pattern by 12/25/15   Status On-going               Plan - 11/10/15 9643    Clinical Impression Statement Pt. reports no L knee pain today; only L knee stiffness initially today.  Pt. tolerated all hip/knee strengthening activity well today with a focus on TKE strengthening actvity; pt. able to progress to 4" L LE step up with black TB resistance around knee; pt. unable to tolerate this previously.  Pt. L knee ROM  improved today; L knee AROM 8-102 dg, PROM 2-106 dg.     PT Treatment/Interventions Patient/family education;Therapeutic exercise;Manual techniques;Passive range of motion;Scar mobilization;Therapeutic activities;Functional mobility training;Gait training;Stair training;Cryotherapy;Vasopneumatic Device;Electrical Stimulation;ADLs/Self Care Home Management   PT Next Visit Plan Continue progressive ROM and strengthening to Lt knee.  Modalities as indicated; TKE activities.      Patient will benefit from skilled therapeutic intervention in order to improve the following deficits and impairments:  Pain, Decreased range of motion, Impaired flexibility, Decreased strength, Abnormal gait, Difficulty walking, Decreased activity tolerance, Decreased balance, Decreased endurance, Decreased scar mobility, Increased edema  Visit Diagnosis: Pain in left knee  Stiffness of left knee, not elsewhere classified  Other abnormalities of gait and mobility  Difficulty in walking, not elsewhere classified     Problem List Patient Active Problem List   Diagnosis Date Noted  . Postoperative anemia due to acute blood loss 10/11/2015  . Arthritis 10/11/2015  . Constipation 10/11/2015  . Headache 10/11/2015  . Osteoarthritis of left knee 10/07/2015  . Obesity 10/07/2015  . S/P total knee replacement using cement 10/07/2015    Bess Harvest, PTA 11/10/2015, 11:51 AM  [facility] [address]  [place], [place], [address] Phone: [phone]   Fax:  [phone]  Name: Lauren Frye MRN: [id number] Date of Birth: 11-Dec-1950

## 2015-11-12 ENCOUNTER — Ambulatory Visit: Payer: BLUE CROSS/BLUE SHIELD

## 2015-11-12 DIAGNOSIS — R2689 Other abnormalities of gait and mobility: Secondary | ICD-10-CM

## 2015-11-12 DIAGNOSIS — M25662 Stiffness of left knee, not elsewhere classified: Secondary | ICD-10-CM

## 2015-11-12 DIAGNOSIS — M25562 Pain in left knee: Secondary | ICD-10-CM

## 2015-11-12 DIAGNOSIS — R262 Difficulty in walking, not elsewhere classified: Secondary | ICD-10-CM

## 2015-11-12 NOTE — Therapy (Signed)
[place] [address]  [place], [place], [address] Phone: [phone]   Fax:  [phone]  Physical Therapy Treatment  Patient Details  Name: Lauren Frye MRN: [id number] Date of Birth: 05-03-1951 Referring Provider: Dr. Durward Fortes   Encounter Date: 11/12/2015      PT End of Session - 11/12/15 1441    Visit Number 6   Number of Visits 20   Date for PT Re-Evaluation 12/25/15   PT Start Time 1105   PT Stop Time 1200   PT Time Calculation (min) 55 min   Activity Tolerance Patient tolerated treatment well   Behavior During Therapy [facility] for tasks assessed/performed      Past Medical History  Diagnosis Date  . Depression   . Headache   . Arthritis     Past Surgical History  Procedure Laterality Date  . Abdominal hysterectomy    . Bowel resection    . Tonsillectomy    . Ankle fusion    . Knee surgery Bilateral     LEFT ARTHROCOPY  X3    ,  RT X1  . Total knee arthroplasty Left 10/07/2015  . Total knee arthroplasty Left 10/07/2015    Procedure: TOTAL KNEE ARTHROPLASTY;  Surgeon: Garald Balding, MD;  Location: [place];  Service: Orthopedics;  Laterality: Left;    There were no vitals filed for this visit.      Subjective Assessment - 11/12/15 1159    Subjective Pt. reports only stiffness today in the L knee no other pain or complaints reported.   Patient Stated Goals "I want the range at 120 dg like they say I should."   Currently in Pain? No/denies   Pain Score 0-No pain   Multiple Pain Sites No            [facility] PT Assessment - 11/12/15 0001    AROM   Left Knee Extension 5      Today Treatment:  Therex: Recumbent bike: 4 min, level 2  L HS/PF, SKTC knee flexion stretch x 30 sec each   Manual: L knee Flexion / ext. mobs   Therex: HS curl with heels on peanut p-ball x 15 reps QS into bolster 5" x 10 reps; therapist overpressure on last 5 reps  Bridging with B abd/ER with blue TB  around knees x 15 rep  R sidelying R hip clam shell with blueTB x 10 reps L Dorsiflexion rocker stretch x 30 sec  Standing L TKE with black TB in door x 15 reps; 1 UE support BATCA leg curl machine 25# x 10 reps; B concentric/eccentric  BATCA leg curl machine x 15 reps; B concentric, L eccentric 4" step up with black TB around knee x 10 reps; 1 UE support  ROM assessment  Vasoneumatic Device: medium compression, coldest temp., supine, L LE elevated, 15'        PT Short Term Goals - 11/10/15 0824    PT SHORT TERM GOAL #1   Title Pt will be independent with initial HEP by 11/27/15   Status Achieved  11/04/15: Pt. independent with initial HEP.     PT SHORT TERM GOAL #2   Title Pt will ambulate with normal gait pattern with SPC by 11/27/15   Status Partially Met  11/10/15: pt. sequencing and gait pattern correct with SPC however pt. continues to demo significant antalgic gait with R wt. shift and decreased L LE stance time.  PT Long Term Goals - 11/04/15 1220    PT LONG TERM GOAL #1   Title Pt will be independent with advanced HEP/gym program by 12/25/15   Status On-going   PT LONG TERM GOAL #2   Title Pt will demonstrate L knee AROM 3-120 or greater by 12/25/15   Status On-going   PT LONG TERM GOAL #3   Title Pt will demonstrate L LE strength 4+/5 or greater by 12/25/15   Status On-going   PT LONG TERM GOAL #4   Title Pt wil ambulate with normal gait pattern on all surfaces w/o AD by 12/25/15   Status On-going   PT LONG TERM GOAL #5   Title Pt will negotiate stairs with good reciprocal gait pattern by 12/25/15   Status On-going               Plan - 11/12/15 1441    Clinical Impression Statement Pt. continues to report stiffness only in the L knee initially and throughout treatment; pt. reports use of CPM discontinued as of 5/9 and that she is now using the recumbent bike at home every day; pt. tolerated advancement of resistance with hip / knee strengthening  activities well today able to demo L knee AROM extension of 5 dg.   Pt. continues to progress well however would benefit from further gait training with SPC to increase step length, L stance time, and L LE wt. shift.     PT Treatment/Interventions Patient/family education;Therapeutic exercise;Manual techniques;Passive range of motion;Scar mobilization;Therapeutic activities;Functional mobility training;Gait training;Stair training;Cryotherapy;Vasopneumatic Device;Electrical Stimulation;ADLs/Self Care Home Management   PT Next Visit Plan TKE activities, L hip / knee strengthening activities; modalities as indicated, progress stepping activities as tolerated      Patient will benefit from skilled therapeutic intervention in order to improve the following deficits and impairments:  Pain, Decreased range of motion, Impaired flexibility, Decreased strength, Abnormal gait, Difficulty walking, Decreased activity tolerance, Decreased balance, Decreased endurance, Decreased scar mobility, Increased edema  Visit Diagnosis: Pain in left knee  Stiffness of left knee, not elsewhere classified  Difficulty in walking, not elsewhere classified  Other abnormalities of gait and mobility     Problem List Patient Active Problem List   Diagnosis Date Noted  . Postoperative anemia due to acute blood loss 10/11/2015  . Arthritis 10/11/2015  . Constipation 10/11/2015  . Headache 10/11/2015  . Osteoarthritis of left knee 10/07/2015  . Obesity 10/07/2015  . S/P total knee replacement using cement 10/07/2015    Bess Harvest, PTA 11/12/2015, 2:50 PM  [facility] [address]  [place], [place], [address] Phone: [phone]   Fax:  [phone]  Name: Eiliyah Reh MRN: [id number] Date of Birth: 03-03-1951

## 2015-11-14 ENCOUNTER — Ambulatory Visit: Payer: BLUE CROSS/BLUE SHIELD

## 2015-11-14 DIAGNOSIS — M25562 Pain in left knee: Secondary | ICD-10-CM

## 2015-11-14 DIAGNOSIS — R262 Difficulty in walking, not elsewhere classified: Secondary | ICD-10-CM

## 2015-11-14 DIAGNOSIS — M25662 Stiffness of left knee, not elsewhere classified: Secondary | ICD-10-CM

## 2015-11-14 DIAGNOSIS — R2689 Other abnormalities of gait and mobility: Secondary | ICD-10-CM

## 2015-11-14 NOTE — Therapy (Signed)
[facility] [address]  [place], [place], [address] Phone: [phone]   Fax:  [phone]  Physical Therapy Treatment  Patient Details  Name: Lauren Frye MRN: [id number] Date of Birth: 1950/11/22 Referring Provider: Dr. Durward Fortes   Encounter Date: 11/14/2015      PT End of Session - 11/14/15 1115    Visit Number 7   Number of Visits 20   Date for PT Re-Evaluation 12/25/15   PT Start Time 1102   PT Stop Time 1145   PT Time Calculation (min) 43 min   Activity Tolerance Patient tolerated treatment well   Behavior During Therapy [facility] for tasks assessed/performed      Past Medical History  Diagnosis Date  . Depression   . Headache   . Arthritis     Past Surgical History  Procedure Laterality Date  . Abdominal hysterectomy    . Bowel resection    . Tonsillectomy    . Ankle fusion    . Knee surgery Bilateral     LEFT ARTHROCOPY  X3    ,  RT X1  . Total knee arthroplasty Left 10/07/2015  . Total knee arthroplasty Left 10/07/2015    Procedure: TOTAL KNEE ARTHROPLASTY;  Surgeon: Garald Balding, MD;  Location: [place];  Service: Orthopedics;  Laterality: Left;    There were no vitals filed for this visit.      Subjective Assessment - 11/14/15 1105    Subjective Pt. reports increased L knee pain today at 4/10 pain today; pt. reports the knee is sore from the extension stretching activity perform with therapist on Wednesday last treatment.     Patient Stated Goals "I want the range at 120 dg like they say I should."   Currently in Pain? Yes   Pain Score 4    Pain Location Knee   Pain Orientation Left   Pain Descriptors / Indicators Aching   Pain Type Surgical pain   Pain Onset 1 to 4 weeks ago   Pain Frequency Intermittent   Multiple Pain Sites No     Today's treatment:  Therex: NuStep: level 4, 4 min  L HS/PF, SKTC knee flexion stretch x 30 sec each   Manual: L knee Flexion / ext. mobs    Therex: HS curl with heels on peanut p-ball x 15 reps QS + bridge with heels on peanut p-ball 3" x 10 reps Bridging with B abd/ER with blue TB around knees x 15 rep   R sidelying R hip clam shell with blueTB x 10 reps L Dorsiflexion rocker stretch x 30 sec   Heel raises on UBE  2 x 15 reps Standing L TKE with black TB in door x 15 reps; 1 UE support Supine SLR hip flexion, abduction, adduction 2# x 10 reps   Vasoneumatic Device: medium compression, coldest temp., supine, L LE elevated, 15'        PT Short Term Goals - 11/10/15 0824    PT SHORT TERM GOAL #1   Title Pt will be independent with initial HEP by 11/27/15   Status Achieved  11/04/15: Pt. independent with initial HEP.     PT SHORT TERM GOAL #2   Title Pt will ambulate with normal gait pattern with SPC by 11/27/15   Status Partially Met  11/10/15: pt. sequencing and gait pattern correct with SPC however pt. continues to demo significant antalgic gait with R wt. shift and decreased L LE stance time.  PT Long Term Goals - 11/04/15 1220    PT LONG TERM GOAL #1   Title Pt will be independent with advanced HEP/gym program by 12/25/15   Status On-going   PT LONG TERM GOAL #2   Title Pt will demonstrate L knee AROM 3-120 or greater by 12/25/15   Status On-going   PT LONG TERM GOAL #3   Title Pt will demonstrate L LE strength 4+/5 or greater by 12/25/15   Status On-going   PT LONG TERM GOAL #4   Title Pt wil ambulate with normal gait pattern on all surfaces w/o AD by 12/25/15   Status On-going   PT LONG TERM GOAL #5   Title Pt will negotiate stairs with good reciprocal gait pattern by 12/25/15   Status On-going               Plan - 11/14/15 1116    Clinical Impression Statement Pt. reports increased L knee pain today at 4/10 pain today; pt. reports the knee is sore from the extension stretching activity perform with therapist on Wednesday last treatment.  Today's Treatment focused on more conservative  supine / standing hip / knee strengthening activity with pt. tolerating well; lunge knee flexion stretch introduced today with pt. tolerating well.  Vasoneumatic device applied following therex to reduce inflammation / pain.     PT Treatment/Interventions Patient/family education;Therapeutic exercise;Manual techniques;Passive range of motion;Scar mobilization;Therapeutic activities;Functional mobility training;Gait training;Stair training;Cryotherapy;Vasopneumatic Device;Electrical Stimulation;ADLs/Self Care Home Management   PT Next Visit Plan TKE activities, L hip / knee strengthening activities; modalities as indicated, progress stepping activities as tolerated      Patient will benefit from skilled therapeutic intervention in order to improve the following deficits and impairments:  Pain, Decreased range of motion, Impaired flexibility, Decreased strength, Abnormal gait, Difficulty walking, Decreased activity tolerance, Decreased balance, Decreased endurance, Decreased scar mobility, Increased edema  Visit Diagnosis: Pain in left knee  Stiffness of left knee, not elsewhere classified  Difficulty in walking, not elsewhere classified  Other abnormalities of gait and mobility     Problem List Patient Active Problem List   Diagnosis Date Noted  . Postoperative anemia due to acute blood loss 10/11/2015  . Arthritis 10/11/2015  . Constipation 10/11/2015  . Headache 10/11/2015  . Osteoarthritis of left knee 10/07/2015  . Obesity 10/07/2015  . S/P total knee replacement using cement 10/07/2015    Bess Harvest, PTA 11/14/2015, 11:51 AM  [facility] [address]  [place], [place], [address] Phone: [phone]   Fax:  [phone]  Name: Lauren Frye MRN: [id number] Date of Birth: 1951/02/22

## 2015-11-18 ENCOUNTER — Ambulatory Visit: Payer: BLUE CROSS/BLUE SHIELD | Admitting: Physical Therapy

## 2015-11-18 DIAGNOSIS — R262 Difficulty in walking, not elsewhere classified: Secondary | ICD-10-CM

## 2015-11-18 DIAGNOSIS — M25662 Stiffness of left knee, not elsewhere classified: Secondary | ICD-10-CM

## 2015-11-18 DIAGNOSIS — R2689 Other abnormalities of gait and mobility: Secondary | ICD-10-CM

## 2015-11-18 DIAGNOSIS — M25562 Pain in left knee: Secondary | ICD-10-CM | POA: Diagnosis not present

## 2015-11-18 NOTE — Therapy (Signed)
[facility] [address]  [address] [place], [place], [address] Phone: [phone]   Fax:  [phone]  Physical Therapy Treatment  Patient Details  Name: Lauren Frye MRN: [id number] Date of Birth: [birth year] Referring Provider: [name], MD  Encounter Date: 11/18/2015      PT End of Session - 11/18/15 1012    Visit Number 8   Number of Visits 20   Date for PT Re-Evaluation 12/25/15   PT Start Time 1010   PT Stop Time 1114   PT Time Calculation (min) 64 min   Activity Tolerance Patient tolerated treatment well   Behavior During Therapy [facility] for tasks assessed/performed      Past Medical History  Diagnosis Date  . Depression   . Headache   . Arthritis     Past Surgical History  Procedure Laterality Date  . Abdominal hysterectomy    . Bowel resection    . Tonsillectomy    . Ankle fusion    . Knee surgery Bilateral     LEFT ARTHROCOPY  X3    ,  RT X1  . Total knee arthroplasty Left 10/07/2015  . Total knee arthroplasty Left 10/07/2015    Procedure: TOTAL KNEE ARTHROPLASTY;  Surgeon: Valeria Batman, MD;  Location: [facility] OR;  Service: Orthopedics;  Laterality: Left;    There were no vitals filed for this visit.      Subjective Assessment - 11/18/15 1011    Subjective Pt reporting more stiffness than pain at present, with pain typically only if she does something "stupid".   Patient Stated Goals "I want the range at 120 dg like they say I should."   Currently in Pain? No/denies            [facility] PT Assessment - 11/18/15 1010    Assessment   Medical Diagnosis L TKR   Referring Provider [name], MD   Onset Date/Surgical Date 10/07/15   Next MD Visit 12/05/15   AROM   AROM Assessment Site Knee   Right/Left Knee Left   Left Knee Extension 5   Left Knee Flexion 109   PROM   Left Knee Extension 0   Left Knee Flexion 110           Today's Treatment  TherEx NuStep - lvl 4 x 4'    Manual Scar tissue assessment/mobilization L patella mobs - all directions with emphasis on inferior L knee Flexion/Extension mobs  L HS/PF, SKTC knee flexion stretch x 30" each   TherEx HS curl / AAROM knee flexion with heels on peanut p-ball x15  QS + Bridge with heels on peanut p-ball 15x3" Fitter Seated Leg Press (1 black/1 blue) x15 L Prostretch heelcord stretch 2x30" TRX Squat x10 TRX Squat + Heel Raise upon return to standing x10 Standing 4 way Hip SLR (flexion, ADD, extension, ABD) with red TB x10 Step stretch with foot on BATCA seat at lowest position 10x3" BATCA Leg Press 25# 2x10  Gait Gait training with SPC shifted from L UE to R to encourage increased hip/knee flexion during swing through with TKE extension on weight acceptance (pt holding L leg more rigid when SPC used on L) Stair training -     Ascent leading with L LE & Descent with L LE eccentric lowering x12 steps (L rail & SPC)    Reciprocal Ascent/Descent with L rail & SPC x12 steps  Vasoneumatic Device to L knee: medium compression, coldest temp., supine,  L LE elevated x 15'           PT Short Term Goals - 11/18/15 1134    PT SHORT TERM GOAL #1   Title Pt will be independent with initial HEP by 11/27/15   Status Achieved   PT SHORT TERM GOAL #2   Title Pt will ambulate with normal gait pattern with SPC by 11/27/15   Status Achieved           PT Long Term Goals - 11/18/15 1135    PT LONG TERM GOAL #1   Title Pt will be independent with advanced HEP/gym program by 12/25/15   Status On-going   PT LONG TERM GOAL #2   Title Pt will demonstrate L knee AROM 3-120 or greater by 12/25/15   Status On-going   PT LONG TERM GOAL #3   Title Pt will demonstrate L LE strength 4+/5 or greater by 12/25/15   Status On-going   PT LONG TERM GOAL #4   Title Pt wil ambulate with normal gait pattern on all surfaces w/o AD by 12/25/15   Status On-going   PT LONG TERM GOAL #5   Title Pt will negotiate stairs with  good reciprocal gait pattern by 12/25/15   Status On-going               Plan - 11/18/15 1131    Clinical Impression Statement Pt reporting mostly stiffness and some increased swelling rather than pain at present. AROM improving to 5-110 currently. Pt tolerating progression of exercises with transition to standing 4 way SLR with red TB resistance (add to HEP next visit?) and increased CKC strengthening. Pt demostrating stiff leg step pattern on L when SPC carried on L, therefore SPC transitioned to R with pr demonstrating improved L hip and knee flexion and normalizing gait pattern. Attempted stairs with pt shifting work of ascent/descent to L LE with good initial tolerance, therefore pt encouraged to continue this pattern worjing toward reciprocal pattern at home. Overall pt progressing well with PT POC.   PT Treatment/Interventions Patient/family education;Therapeutic exercise;Manual techniques;Passive range of motion;Scar mobilization;Therapeutic activities;Functional mobility training;Gait training;Stair training;Cryotherapy;Vasopneumatic Device;Electrical Stimulation;ADLs/Self Care Home Management   PT Next Visit Plan TKE activities, L hip / knee strengthening activities; modalities as indicated, review of reciprocal stair training as indicated   Consulted and Agree with Plan of Care Patient      Patient will benefit from skilled therapeutic intervention in order to improve the following deficits and impairments:  Pain, Decreased range of motion, Impaired flexibility, Decreased strength, Abnormal gait, Difficulty walking, Decreased activity tolerance, Decreased balance, Decreased endurance, Decreased scar mobility, Increased edema  Visit Diagnosis: Pain in left knee  Stiffness of left knee, not elsewhere classified  Difficulty in walking, not elsewhere classified  Other abnormalities of gait and mobility     Problem List Patient Active Problem List   Diagnosis Date Noted  .  Postoperative anemia due to acute blood loss 10/11/2015  . Arthritis 10/11/2015  . Constipation 10/11/2015  . Headache 10/11/2015  . Osteoarthritis of left knee 10/07/2015  . Obesity 10/07/2015  . S/P total knee replacement using cement 10/07/2015    Marry GuanJoAnne M Kreis, PT, MPT 11/18/2015, 11:51 AM  [facility] [address]  [address] [place], [place], [address] Phone: [phone]   Fax:  [phone]  Name: Theodis AguasSusan Lewey MRN: [id number] Date of Birth: [birth year]

## 2015-11-21 ENCOUNTER — Ambulatory Visit: Payer: BLUE CROSS/BLUE SHIELD | Admitting: Physical Therapy

## 2015-11-21 DIAGNOSIS — M25662 Stiffness of left knee, not elsewhere classified: Secondary | ICD-10-CM

## 2015-11-21 DIAGNOSIS — M25562 Pain in left knee: Secondary | ICD-10-CM | POA: Diagnosis not present

## 2015-11-21 DIAGNOSIS — R262 Difficulty in walking, not elsewhere classified: Secondary | ICD-10-CM

## 2015-11-21 DIAGNOSIS — R2689 Other abnormalities of gait and mobility: Secondary | ICD-10-CM

## 2015-11-21 NOTE — Therapy (Signed)
[facility] [address]  [address] [place], [place], [address] Phone: [phone]   Fax:  [phone]  Physical Therapy Treatment  Patient Details  Name: Lauren Frye MRN: [id number] Date of Birth: 1950-09-10 Referring Provider: Norlene CampbellPeter Whitfield, MD  Encounter Date: 11/21/2015      PT End of Session - 11/21/15 1103    Visit Number 9   Number of Visits 20   Date for PT Re-Evaluation 12/25/15   PT Start Time 1058   PT Stop Time 1159   PT Time Calculation (min) 61 min   Activity Tolerance Patient tolerated treatment well   Behavior During Therapy [facility] for tasks assessed/performed      Past Medical History  Diagnosis Date  . Depression   . Headache   . Arthritis     Past Surgical History  Procedure Laterality Date  . Abdominal hysterectomy    . Bowel resection    . Tonsillectomy    . Ankle fusion    . Knee surgery Bilateral     LEFT ARTHROCOPY  X3    ,  RT X1  . Total knee arthroplasty Left 10/07/2015  . Total knee arthroplasty Left 10/07/2015    Procedure: TOTAL KNEE ARTHROPLASTY;  Surgeon: Valeria BatmanPeter W Whitfield, MD;  [facility];  Service: Orthopedics;  Laterality: Left;    There were no vitals filed for this visit.      Subjective Assessment - 11/21/15 1100    Subjective Pt reports pain and swelling increased on Wed requiring her to take the prescription pain meds she hadn't needed for a while. Pain better today. Pt reporting she has been able to ascend strairs reciprocally at home  and has started leading with R leg on descent but not comfortable attempting reciprocal descent at this time.   Patient Stated Goals "I want the range at 120 dg like they say I should."   Currently in Pain? Yes   Pain Score --  3-4/10   Pain Location Knee   Pain Orientation Left   Pain Descriptors / Indicators Tightness            Today's Treatment  TherEx Rec Bike - lvl 1 x 5'  Manual Scar tissue  assessment/mobilization L patella mobs - all directions with emphasis on inferior L knee Flexion/Extension mobs  L HS/PF, SKTC knee flexion stretch x 30" each   TherEx HS curl / AAROM knee flexion with heels on peanut p-ball x15  QS + Bridge with heels on peanut p-ball 15x3" Standing 4 way Hip SLR (flexion, ADD, extension, ABD) with red TB x10 BATCA Leg Press 25# 2x10 BATCA Knee Flexion 20# B concentric/eccentric x15; 15# B concentric/Leccentric x15 BATCA Knee Extension 10# B concentric/eccentric x15 TRX Squat x10 TRX Squat + Heel Raise upon return to standing x10 L Prostretch heelcord stretch 2x30" 6" L Fwd Step-up x10 6" L Lat Step-up x10  Vasoneumatic Device to L knee: medium compression, coldest temp., supine, L LE elevated x 15'           PT Short Term Goals - 11/18/15 1134    PT SHORT TERM GOAL #1   Title Pt will be independent with initial HEP by 11/27/15   Status Achieved   PT SHORT TERM GOAL #2   Title Pt will ambulate with normal gait pattern with SPC by 11/27/15   Status Achieved           PT Long Term Goals - 11/18/15 1135  PT LONG TERM GOAL #1   Title Pt will be independent with advanced HEP/gym program by 12/25/15   Status On-going   PT LONG TERM GOAL #2   Title Pt will demonstrate L knee AROM 3-120 or greater by 12/25/15   Status On-going   PT LONG TERM GOAL #3   Title Pt will demonstrate L LE strength 4+/5 or greater by 12/25/15   Status On-going   PT LONG TERM GOAL #4   Title Pt wil ambulate with normal gait pattern on all surfaces w/o AD by 12/25/15   Status On-going   PT LONG TERM GOAL #5   Title Pt will negotiate stairs with good reciprocal gait pattern by 12/25/15   Status On-going               Plan - 11/21/15 1104    Clinical Impression Statement Pt reporting increased pain and swelling on Wed, potentially as a result of increased activity during therapy session on Tuesday. Pain an swelling better today, but limited progression of  exercises/activities today to avoid creating further irritation.   PT Treatment/Interventions Patient/family education;Therapeutic exercise;Manual techniques;Passive range of motion;Scar mobilization;Therapeutic activities;Functional mobility training;Gait training;Stair training;Cryotherapy;Vasopneumatic Device;Electrical Stimulation;ADLs/Self Care Home Management   PT Next Visit Plan 10th visit FOTO; TKE activities, L hip / knee strengthening activities; modalities as indicated, review of reciprocal stair training as indicated   Consulted and Agree with Plan of Care Patient      Patient will benefit from skilled therapeutic intervention in order to improve the following deficits and impairments:  Pain, Decreased range of motion, Impaired flexibility, Decreased strength, Abnormal gait, Difficulty walking, Decreased activity tolerance, Decreased balance, Decreased endurance, Decreased scar mobility, Increased edema  Visit Diagnosis: Pain in left knee  Stiffness of left knee, not elsewhere classified  Difficulty in walking, not elsewhere classified  Other abnormalities of gait and mobility     Problem List Patient Active Problem List   Diagnosis Date Noted  . Postoperative anemia due to acute blood loss 10/11/2015  . Arthritis 10/11/2015  . Constipation 10/11/2015  . Headache 10/11/2015  . Osteoarthritis of left knee 10/07/2015  . Obesity 10/07/2015  . S/P total knee replacement using cement 10/07/2015    Marry Guan, PT, MPT 11/21/2015, 12:15 PM  [facility] [address]  [address] [place], [place], [address] Phone: [phone]   Fax:  [phone]  Name: Lauren Frye MRN: [id number] Date of Birth: July 08, 1950

## 2015-11-25 ENCOUNTER — Ambulatory Visit: Payer: BLUE CROSS/BLUE SHIELD

## 2015-11-25 DIAGNOSIS — R2689 Other abnormalities of gait and mobility: Secondary | ICD-10-CM

## 2015-11-25 DIAGNOSIS — M25562 Pain in left knee: Secondary | ICD-10-CM | POA: Diagnosis not present

## 2015-11-25 DIAGNOSIS — M25662 Stiffness of left knee, not elsewhere classified: Secondary | ICD-10-CM

## 2015-11-25 DIAGNOSIS — R262 Difficulty in walking, not elsewhere classified: Secondary | ICD-10-CM

## 2015-11-25 NOTE — Therapy (Signed)
[facility] [address]  [address] [place], [place], [address] Phone: [phone]   Fax:  [phone]  Physical Therapy Treatment  Patient Details  Name: Lauren Frye MRN: [id number] Date of Birth: [birth year] Referring Provider: [name], MD  Encounter Date: 11/25/2015      PT End of Session - 11/25/15 1117    Visit Number 10   Number of Visits 20   Date for PT Re-Evaluation 12/25/15   PT Start Time 1104   PT Stop Time 1201   PT Time Calculation (min) 57 min   Activity Tolerance Patient tolerated treatment well   Behavior During Therapy [facility] for tasks assessed/performed      Past Medical History  Diagnosis Date  . Depression   . Headache   . Arthritis     Past Surgical History  Procedure Laterality Date  . Abdominal hysterectomy    . Bowel resection    . Tonsillectomy    . Ankle fusion    . Knee surgery Bilateral     LEFT ARTHROCOPY  X3    ,  RT X1  . Total knee arthroplasty Left 10/07/2015  . Total knee arthroplasty Left 10/07/2015    Procedure: TOTAL KNEE ARTHROPLASTY;  Surgeon: Valeria Batman, MD;  Location: [facility] OR;  Service: Orthopedics;  Laterality: Left;    There were no vitals filed for this visit.      Subjective Assessment - 11/25/15 1107    Subjective Pt. reports L knee is 5/10 "tightness" today however reports being pain free at L knee today.   Patient Stated Goals "I want the range at 120 dg like they say I should."   Currently in Pain? No/denies   Pain Score 0-No pain   Multiple Pain Sites No            [facility] PT Assessment - 11/25/15 0001    Observation/Other Assessments   Focus on Therapeutic Outcomes (FOTO)  knee - 51% (49% limitation)    AROM   Left Knee Extension 5   Left Knee Flexion 109   PROM   Left Knee Extension 0   Left Knee Flexion 112      Today's Treatment  TherEx Rec Bike - lvl 1 x 5'  Manual: L knee Flexion/Extension mobs  L HS/PF, SKTC knee  flexion stretch x 30" each   TherEx: HS curl / AAROM knee flexion with heels on peanut p-ball x15  QS with heels on bolster 5" x 10 reps BATCA knee flexion 25# x 10; 20#; B concentric/L eccentric x 10 reps  B SLR hip flexion, abduction with 2# cuffweights x 10 reps  L Prostretch heelcord stretch 2x30" 6" L Fwd Step-up with black TB pulling into flexion 2 x 15 reps; 2nd set with progressive resistance 4" L eccentric lowering; this activity terminated due to trendelenburg drop + instability  Vasoneumatic Device to L knee: medium compression, coldest temp., supine, L LE elevated x 10'       PT Short Term Goals - 11/18/15 1134    PT SHORT TERM GOAL #1   Title Pt will be independent with initial HEP by 11/27/15   Status Achieved   PT SHORT TERM GOAL #2   Title Pt will ambulate with normal gait pattern with SPC by 11/27/15   Status Achieved           PT Long Term Goals - 11/18/15 1135    PT LONG TERM GOAL #1  Title Pt will be independent with advanced HEP/gym program by 12/25/15   Status On-going   PT LONG TERM GOAL #2   Title Pt will demonstrate L knee AROM 3-120 or greater by 12/25/15   Status On-going   PT LONG TERM GOAL #3   Title Pt will demonstrate L LE strength 4+/5 or greater by 12/25/15   Status On-going   PT LONG TERM GOAL #4   Title Pt wil ambulate with normal gait pattern on all surfaces w/o AD by 12/25/15   Status On-going   PT LONG TERM GOAL #5   Title Pt will negotiate stairs with good reciprocal gait pattern by 12/25/15   Status On-going               Plan - 11/25/15 1125    Clinical Impression Statement Pt. reports L knee is 5/10 "tightness" today however reports being pain free at L knee today.  Pt. tolerated all hip / knee strengthening activity well today; 6" L step up with black TB pulling into flexion progressed today with more reps and increased resistance; pt. tolerated this very well with only "tightness" reported.     PT Treatment/Interventions  Patient/family education;Therapeutic exercise;Manual techniques;Passive range of motion;Scar mobilization;Therapeutic activities;Functional mobility training;Gait training;Stair training;Cryotherapy;Vasopneumatic Device;Electrical Stimulation;ADLs/Self Care Home Management   PT Next Visit Plan TKE activities, L hip / knee strengthening activities; modalities as indicated, review of reciprocal stair training as indicated      Patient will benefit from skilled therapeutic intervention in order to improve the following deficits and impairments:  Pain, Decreased range of motion, Impaired flexibility, Decreased strength, Abnormal gait, Difficulty walking, Decreased activity tolerance, Decreased balance, Decreased endurance, Decreased scar mobility, Increased edema  Visit Diagnosis: Pain in left knee  Stiffness of left knee, not elsewhere classified  Difficulty in walking, not elsewhere classified  Other abnormalities of gait and mobility     Problem List Patient Active Problem List   Diagnosis Date Noted  . Postoperative anemia due to acute blood loss 10/11/2015  . Arthritis 10/11/2015  . Constipation 10/11/2015  . Headache 10/11/2015  . Osteoarthritis of left knee 10/07/2015  . Obesity 10/07/2015  . S/P total knee replacement using cement 10/07/2015    Kermit BaloMicah Jourdain Guay, PTA 11/25/2015, 12:05 PM  [facility] [address]  [address] [place], [place], [address] Phone: [phone]   Fax:  [phone]  Name: Lauren Frye MRN: [id number] Date of Birth: 08/16/1950

## 2015-11-27 ENCOUNTER — Ambulatory Visit: Payer: BLUE CROSS/BLUE SHIELD

## 2015-11-27 DIAGNOSIS — M25562 Pain in left knee: Secondary | ICD-10-CM

## 2015-11-27 DIAGNOSIS — M25662 Stiffness of left knee, not elsewhere classified: Secondary | ICD-10-CM

## 2015-11-27 DIAGNOSIS — R2689 Other abnormalities of gait and mobility: Secondary | ICD-10-CM

## 2015-11-27 DIAGNOSIS — R262 Difficulty in walking, not elsewhere classified: Secondary | ICD-10-CM

## 2015-11-27 NOTE — Therapy (Signed)
[facility] [place] [address]  [address] [place], [place], [address] Phone: [phone]   Fax:  [phone]  Physical Therapy Treatment  Patient Details  Name: Lauren AguasSusan Frye MRN: [id number] Date of Birth: 1950-12-28 Referring Provider: Norlene CampbellPeter Whitfield, MD  Encounter Date: 11/27/2015      PT End of Session - 11/27/15 1106    Visit Number 11   Number of Visits 20   Date for PT Re-Evaluation 12/25/15   PT Start Time 1021   PT Stop Time 1119   PT Time Calculation (min) 58 min   Activity Tolerance Patient tolerated treatment well   Behavior During Therapy [facility] for tasks assessed/performed      Past Medical History  Diagnosis Date  . Depression   . Headache   . Arthritis     Past Surgical History  Procedure Laterality Date  . Abdominal hysterectomy    . Bowel resection    . Tonsillectomy    . Ankle fusion    . Knee surgery Bilateral     LEFT ARTHROCOPY  X3    ,  RT X1  . Total knee arthroplasty Left 10/07/2015  . Total knee arthroplasty Left 10/07/2015    Procedure: TOTAL KNEE ARTHROPLASTY;  Surgeon: Valeria BatmanPeter W Whitfield, MD;  [facility];  Service: Orthopedics;  Laterality: Left;    There were no vitals filed for this visit.      Subjective Assessment - 11/27/15 1026    Subjective Pt. reports L knee "tightness" currently however no L knee pain today initially.     Patient Stated Goals "I want the range at 120 dg like they say I should."   Currently in Pain? No/denies   Pain Score 0-No pain   Multiple Pain Sites No      Today's Treatment  TherEx Rec Bike - lvl 1 x 5'  Manual: L knee Flexion/Extension mobs  L HS/PF, SKTC knee flexion stretch x 30" each  L knee patellar mobs all directions  TherEx: HS curl / AAROM knee flexion with heels on peanut p-ball 2 x 10 reps; second set with strap assist for increased L knee flexion stretch QS with heels on bolster 5" x 10 reps Straight leg bridge with B QS 3" x  10 reps  Hooklying with hip isometric / ER with black TB around knees 3 x 5 reps Hooklying B hip abd/ER with black TB around knees x 10 reps L SLR hip flexion, flexion/ER, abduction with 2# cuffweights x 10 reps  L Prostretch heelcord stretch 2x30" L TKE with black TB in door x 15 reps  6" L Fwd Step-up with black TB pulling into flexion x 15 reps 4" L eccentric lowering; this activity terminated due to trendelenburg drop + instability  Vasoneumatic Device to L knee: medium compression, coldest temp., supine, L LE elevated x 15'        PT Short Term Goals - 11/18/15 1134    PT SHORT TERM GOAL #1   Title Pt will be independent with initial HEP by 11/27/15   Status Achieved   PT SHORT TERM GOAL #2   Title Pt will ambulate with normal gait pattern with SPC by 11/27/15   Status Achieved           PT Long Term Goals - 11/18/15 1135    PT LONG TERM GOAL #1   Title Pt will be independent with advanced HEP/gym program by 12/25/15   Status On-going  PT LONG TERM GOAL #2   Title Pt will demonstrate L knee AROM 3-120 or greater by 12/25/15   Status On-going   PT LONG TERM GOAL #3   Title Pt will demonstrate L LE strength 4+/5 or greater by 12/25/15   Status On-going   PT LONG TERM GOAL #4   Title Pt wil ambulate with normal gait pattern on all surfaces w/o AD by 12/25/15   Status On-going   PT LONG TERM GOAL #5   Title Pt will negotiate stairs with good reciprocal gait pattern by 12/25/15   Status On-going               Plan - 11/27/15 1106    Clinical Impression Statement Pt. continues to report moderate tightness in L knee however pain free today initially.  Pt. tolerated all hip / knee strengthening activity well with focus on L TKE strengthening; pt. unable to perform 4" L quad eccentric  lowering at this point and would benefit from further TKE strengthening activity.     PT Treatment/Interventions Patient/family education;Therapeutic exercise;Manual techniques;Passive  range of motion;Scar mobilization;Therapeutic activities;Functional mobility training;Gait training;Stair training;Cryotherapy;Vasopneumatic Device;Electrical Stimulation;ADLs/Self Care Home Management   PT Next Visit Plan Add hip / knee strengthening activities to HEP TKE activities, L hip / knee strengthening activities; modalities as indicated, review of reciprocal stair training as indicated      Patient will benefit from skilled therapeutic intervention in order to improve the following deficits and impairments:  Pain, Decreased range of motion, Impaired flexibility, Decreased strength, Abnormal gait, Difficulty walking, Decreased activity tolerance, Decreased balance, Decreased endurance, Decreased scar mobility, Increased edema  Visit Diagnosis: Pain in left knee  Stiffness of left knee, not elsewhere classified  Difficulty in walking, not elsewhere classified  Other abnormalities of gait and mobility     Problem List Patient Active Problem List   Diagnosis Date Noted  . Postoperative anemia due to acute blood loss 10/11/2015  . Arthritis 10/11/2015  . Constipation 10/11/2015  . Headache 10/11/2015  . Osteoarthritis of left knee 10/07/2015  . Obesity 10/07/2015  . S/P total knee replacement using cement 10/07/2015    Kermit Balo, PTA 11/27/2015, 1:03 PM  [facility] [address]  [address] [place], [place], [address] Phone: [phone]   Fax:  [phone]  Name: Lauren Frye MRN: [id number] Date of Birth: 08/16/1950

## 2015-11-28 ENCOUNTER — Ambulatory Visit: Payer: BLUE CROSS/BLUE SHIELD | Admitting: Physical Therapy

## 2015-12-02 ENCOUNTER — Ambulatory Visit: Payer: BLUE CROSS/BLUE SHIELD

## 2015-12-02 DIAGNOSIS — M25562 Pain in left knee: Secondary | ICD-10-CM

## 2015-12-02 DIAGNOSIS — R2689 Other abnormalities of gait and mobility: Secondary | ICD-10-CM

## 2015-12-02 DIAGNOSIS — M25662 Stiffness of left knee, not elsewhere classified: Secondary | ICD-10-CM

## 2015-12-02 DIAGNOSIS — R262 Difficulty in walking, not elsewhere classified: Secondary | ICD-10-CM

## 2015-12-02 NOTE — Therapy (Signed)
[place] [address]  [place], [place], [address] Phone: [phone]   Fax:  [phone]  Physical Therapy Treatment  Patient Details  Name: Lauren Frye MRN: [id number] Date of Birth: 12/23/1950 Referring Provider: Joni Fears, MD  Encounter Date: 12/02/2015      PT End of Session - 12/02/15 1126    Visit Number 12   Number of Visits 20   Date for PT Re-Evaluation 12/25/15   PT Start Time 8101   PT Stop Time 1203   PT Time Calculation (min) 58 min   Activity Tolerance Patient tolerated treatment well   Behavior During Therapy [facility] for tasks assessed/performed      Past Medical History  Diagnosis Date  . Depression   . Headache   . Arthritis     Past Surgical History  Procedure Laterality Date  . Abdominal hysterectomy    . Bowel resection    . Tonsillectomy    . Ankle fusion    . Knee surgery Bilateral     LEFT ARTHROCOPY  X3    ,  RT X1  . Total knee arthroplasty Left 10/07/2015  . Total knee arthroplasty Left 10/07/2015    Procedure: TOTAL KNEE ARTHROPLASTY;  Surgeon: Garald Balding, MD;  Location: [place];  Service: Orthopedics;  Laterality: Left;    There were no vitals filed for this visit.      Subjective Assessment - 12/02/15 1122    Subjective Pt. reports her L knee "gave out" on her twice today at home before therapy.     Patient Stated Goals "I want the range at 120 dg like they say I should."   Currently in Pain? No/denies   Pain Score 0-No pain   Multiple Pain Sites No     Today's Treatment:  NuStep: level 1, 5 min  L HS, PF, SKTC stretch x 30 sec  Hooklying bridge x 15 reps  Hooklying B hip abd/ER with black TB x 15 reps  B sidelying clam shell with black TB x 10 reps each side Seated Fitter L knee ext. (1 blue / 1 black) x 15 reps  Standing 6" L step up with black TB pulling into flexion x 15 reps  Standing L TKE with black TB in door x 15 reps  BATCA HS curl  machine 25# x 15 reps  L dorsiflexion rocker stretch x 30 sec   vasoneumatic device: medium compression, coldest temp., L LE elevated, L knee, 15 min   ROM assessment   Goals assessment      PT Education - 12/02/15 1157    Education provided Yes   Education Details HEP hip strengthening with black band issued with handout   Person(s) Educated Patient   Methods Explanation;Handout;Verbal cues   Comprehension Verbalized understanding;Need further instruction          PT Short Term Goals - 11/18/15 1134    PT SHORT TERM GOAL #1   Title Pt will be independent with initial HEP by 11/27/15   Status Achieved   PT SHORT TERM GOAL #2   Title Pt will ambulate with normal gait pattern with SPC by 11/27/15   Status Achieved           PT Long Term Goals - 12/02/15 1200    PT LONG TERM GOAL #1   Title Pt will be independent with advanced HEP/gym program by 12/25/15   Status Partially Met  12/02/15: pt. independent with  initial HEP; additional hip strengthening HEP activities added today.   PT LONG TERM GOAL #2   Title Pt will demonstrate L knee AROM 3-120 or greater by 12/25/15   Status Partially Met  12/02/15: Pt. able to demo L knee AROM 1-109 dg    PT LONG TERM GOAL #3   Title Pt will demonstrate L LE strength 4+/5 or greater by 12/25/15   Status On-going   PT LONG TERM GOAL #4   Title Pt wil ambulate with normal gait pattern on all surfaces w/o AD by 12/25/15   Status On-going   PT LONG TERM GOAL #5   Title Pt will negotiate stairs with good reciprocal gait pattern by 12/25/15   Status On-going  12/02/15: pt. able to ascend stairs with reciprocal pattern and SPC with good control however only able to descend with SPC and rail use with step-to pattern; pt. still demo's L quad instability.                Plan - 12/02/15 1126    Clinical Impression Statement Pt. reports her L knee "gave out" on her twice today at home before therapy today.  Pt. reports this instability at  the L knee has only started the last few days.  Today's treatment continued focus on L TKE with hip / knee strengthening activities in supine and standing; pt. tolerated all well; L knee AROM 1-109 dg at this point. pt. able to ascend stairs with reciprocal pattern and SPC with good control however only able to descend with SPC and rail use with step-to pattern; pt. still demo's L quad instability.    PT Treatment/Interventions Patient/family education;Therapeutic exercise;Manual techniques;Passive range of motion;Scar mobilization;Therapeutic activities;Functional mobility training;Gait training;Stair training;Cryotherapy;Vasopneumatic Device;Electrical Stimulation;ADLs/Self Care Home Management   PT Next Visit Plan TKE activities, L hip / knee strengthening activities; modalities as indicated, review of reciprocal stair training as indicated      Patient will benefit from skilled therapeutic intervention in order to improve the following deficits and impairments:  Pain, Decreased range of motion, Impaired flexibility, Decreased strength, Abnormal gait, Difficulty walking, Decreased activity tolerance, Decreased balance, Decreased endurance, Decreased scar mobility, Increased edema  Visit Diagnosis: Pain in left knee  Stiffness of left knee, not elsewhere classified  Difficulty in walking, not elsewhere classified  Other abnormalities of gait and mobility     Problem List Patient Active Problem List   Diagnosis Date Noted  . Postoperative anemia due to acute blood loss 10/11/2015  . Arthritis 10/11/2015  . Constipation 10/11/2015  . Headache 10/11/2015  . Osteoarthritis of left knee 10/07/2015  . Obesity 10/07/2015  . S/P total knee replacement using cement 10/07/2015    Bess Harvest, PTA 12/02/2015, 12:38 PM  [facility] [address]  [place], [place], [address] Phone: [phone]   Fax:  [phone]  Name: Lauren Frye MRN: [id number] Date of Birth: 1950/08/13

## 2015-12-04 ENCOUNTER — Ambulatory Visit: Payer: BLUE CROSS/BLUE SHIELD | Attending: Orthopaedic Surgery | Admitting: Physical Therapy

## 2015-12-04 DIAGNOSIS — R262 Difficulty in walking, not elsewhere classified: Secondary | ICD-10-CM | POA: Diagnosis present

## 2015-12-04 DIAGNOSIS — M25662 Stiffness of left knee, not elsewhere classified: Secondary | ICD-10-CM | POA: Insufficient documentation

## 2015-12-04 DIAGNOSIS — R2689 Other abnormalities of gait and mobility: Secondary | ICD-10-CM | POA: Diagnosis present

## 2015-12-04 DIAGNOSIS — M25562 Pain in left knee: Secondary | ICD-10-CM

## 2015-12-04 NOTE — Therapy (Signed)
[place] [address]  [place], [place], [address] Phone: [phone]   Fax:  [phone]  Physical Therapy Treatment  Patient Details  Name: Lauren Frye MRN: [id number] Date of Birth: [birth year] Referring Provider: Joni Fears, MD  Encounter Date: 12/04/2015      PT End of Session - 12/04/15 1027    Visit Number 13   Number of Visits 20   Date for PT Re-Evaluation 12/25/15   PT Start Time 1021   PT Stop Time 1127   PT Time Calculation (min) 66 min   Activity Tolerance Patient tolerated treatment well   Behavior During Therapy [facility] for tasks assessed/performed      Past Medical History  Diagnosis Date  . Depression   . Headache   . Arthritis     Past Surgical History  Procedure Laterality Date  . Abdominal hysterectomy    . Bowel resection    . Tonsillectomy    . Ankle fusion    . Knee surgery Bilateral     LEFT ARTHROCOPY  X3    ,  RT X1  . Total knee arthroplasty Left 10/07/2015  . Total knee arthroplasty Left 10/07/2015    Procedure: TOTAL KNEE ARTHROPLASTY;  Surgeon: Garald Balding, MD;  Location: [place];  Service: Orthopedics;  Laterality: Left;    There were no vitals filed for this visit.      Subjective Assessment - 12/04/15 1025    Subjective Pt reports no pain at present, just stiffness. No further episodes of L knee giving out and has been able to walk around in her yard. Walking w/o cane some in home, but still takes cane when she goes out.   Currently in Pain? No/denies            [facility] PT Assessment - 12/04/15 1030    Assessment   Medical Diagnosis L TKR   Onset Date/Surgical Date 10/07/15   Next MD Visit 12/04/15   ROM / Strength   AROM / PROM / Strength AROM;Strength   AROM   AROM Assessment Site Knee   Right/Left Knee Left   Left Knee Extension 4   Left Knee Flexion 110   PROM   Left Knee Extension 0   Left Knee Flexion 112   Strength   Strength Assessment  Site Hip;Knee   Right Hip Flexion 5/5   Right Hip Extension 4+/5   Right Hip ABduction 4+/5   Right Hip ADduction 4/5   Left Hip Flexion 4+/5   Left Hip Extension 4-/5   Left Hip ABduction 4/5   Left Hip ADduction 4-/5   Right Knee Flexion 4+/5   Right Knee Extension 4+/5   Left Knee Flexion 4-/5   Left Knee Extension 4/5        Today's Treatment  TherEx Rec Bike - lvl 2 x 5'  Manual Scar tissue assessment/mobilization L patella mobs - all directions with emphasis on inferior L knee Flexion/Extension mobs  L HS/PF, SKTC knee flexion stretch x 30" each   ROM/MMT assessment (see above)  Gait/Stair assessment Gait - able to ambulate with good hip/knee mechanics w/o SPC but decreased weight shift to L when cane not used (pt aware but feels hesitant to put more weight on L due to recent episodes of knee buckling & reports long-standing h/o favoring L LE) Stair - ascent with 1 rail and SPC with recpirocal pattern but increased hip hike on L         -  descent with 1 rail and SPC with preferred step-to pattern leading with L; when reciprocal pattern (R lead) attempted, pt demonstrates lack of eccentric control causing "drop" to lower step  Goal Assessment  TherEx BATCA Leg Press 25# 2x10 BATCA Knee Flexion 25# B concentric/eccentric x15; 20# B concentric/Leccentric x10 Standing 4 way Hip SLR (flexion, ADD, extension, ABD) with green TB x10  Modalities Vasoneumatic Device to L knee: medium compression, coldest temp., supine, L LE elevated x 15'         PT Education - 12/04/15 1121    Education provided Yes   Education Details Standing 4 way SLR advanced to green TB; Pt encouraged to walk w/o SPC within home with concentration on increased weight shift to L   Person(s) Educated Patient   Methods Explanation;Demonstration   Comprehension Verbalized understanding;Returned demonstration          PT Short Term Goals - 11/18/15 1134    PT SHORT TERM GOAL #1   Title  Pt will be independent with initial HEP by 11/27/15   Status Achieved   PT SHORT TERM GOAL #2   Title Pt will ambulate with normal gait pattern with SPC by 11/27/15   Status Achieved           PT Long Term Goals - 12/04/15 1046    PT LONG TERM GOAL #1   Title Pt will be independent with advanced HEP/gym program by 12/25/15   Status Partially Met  12/04/15: Independent with current HEP, but continue to progress PRN   PT LONG TERM GOAL #2   Title Pt will demonstrate L knee AROM 3-120 or greater by 12/25/15   Status Partially Met  12/04/15: L knee AROM 4-110, PROM 0-112   PT LONG TERM GOAL #3   Title Pt will demonstrate L LE strength 4+/5 or greater by 12/25/15   Status On-going  12/04/15: Overall strength improving but only met at 4+/5 for L hip flexion   PT LONG TERM GOAL #4   Title Pt wil ambulate with normal gait pattern on all surfaces w/o AD by 12/25/15   Status Partially Met  12/04/15: Able to walk with normal hip/knee mechanics w/o SPC on level surfaces but demonstrates decreased weight shift to L   PT LONG TERM GOAL #5   Title Pt will negotiate stairs with good reciprocal gait pattern by 12/25/15   Status Partially Met  12/04/15: Pt able to ascend stairs with reciprocal pattern and SPC although slight hip hiking eveident. Only able to descend with [facility] and rail use with step-to pattern, with ROM and eccentric quad control limiting reciprocal descent.               Plan - 12/04/15 1128    Clinical Impression Statement Pt progressing well with PT so far. Improving quad control with L knee AROM currently 4-110 dg (14-92 at eval) and PROM 0-112 dg with flexion ROM limited by edema and tightness. Overall B LE strength improving with L hip flexion 4+/5, ABD 4/5, extension & ADD 4-/5; and knee flexion 4-/5 & extension 4/5; with R LE grossly 4+/5. Pt demonstrating good gait pattern with SPC and able to maintain good hip/knee/ankle mechanics w/o AD but demostrates decreased weight shift to L.  Pt able to ascend stairs reciprocally with slight hip hike noted but continues to rely on step-to pattern for descent due to limited ROM and lack of eccentric control. All STG's met and majority of LTG's at least partially met.  PT Treatment/Interventions Patient/family education;Therapeutic exercise;Manual techniques;Passive range of motion;Scar mobilization;Therapeutic activities;Functional mobility training;Gait training;Stair training;Cryotherapy;Vasopneumatic Device;Electrical Stimulation;ADLs/Self Care Home Management   PT Next Visit Plan TKE activities, L hip / knee strengthening activities; modalities as indicated, review of gait training w/o AD & reciprocal stair training as indicated   Consulted and Agree with Plan of Care Patient      Patient will benefit from skilled therapeutic intervention in order to improve the following deficits and impairments:  Pain, Decreased range of motion, Impaired flexibility, Decreased strength, Abnormal gait, Difficulty walking, Decreased activity tolerance, Decreased balance, Decreased endurance, Decreased scar mobility, Increased edema  Visit Diagnosis: Pain in left knee  Stiffness of left knee, not elsewhere classified  Difficulty in walking, not elsewhere classified  Other abnormalities of gait and mobility     Problem List Patient Active Problem List   Diagnosis Date Noted  . Postoperative anemia due to acute blood loss 10/11/2015  . Arthritis 10/11/2015  . Constipation 10/11/2015  . Headache 10/11/2015  . Osteoarthritis of left knee 10/07/2015  . Obesity 10/07/2015  . S/P total knee replacement using cement 10/07/2015    Percival Spanish, PT, MPT 12/04/2015, 11:40 AM  [facility] [address]  [place], [place], [address] Phone: [phone]   Fax:  [phone]  Name: Jerzie Bieri MRN: [id number] Date of Birth: 03-18-1951

## 2015-12-05 ENCOUNTER — Ambulatory Visit: Payer: BLUE CROSS/BLUE SHIELD

## 2015-12-09 ENCOUNTER — Ambulatory Visit: Payer: BLUE CROSS/BLUE SHIELD

## 2015-12-09 DIAGNOSIS — R2689 Other abnormalities of gait and mobility: Secondary | ICD-10-CM

## 2015-12-09 DIAGNOSIS — M25662 Stiffness of left knee, not elsewhere classified: Secondary | ICD-10-CM

## 2015-12-09 DIAGNOSIS — M25562 Pain in left knee: Secondary | ICD-10-CM | POA: Diagnosis not present

## 2015-12-09 DIAGNOSIS — R262 Difficulty in walking, not elsewhere classified: Secondary | ICD-10-CM

## 2015-12-09 NOTE — Therapy (Signed)
[facility] [place] [facility] [address]  [place], [place], [address] Phone: [phone]   Fax:  [phone]  Physical Therapy Treatment  Patient Details  Name: Lauren Frye MRN: [id number] Date of Birth: 06-Aug-1950 Referring Provider: Joni Fears, MD  Encounter Date: 12/09/2015      PT End of Session - 12/09/15 1617    Visit Number 14   Number of Visits 20   Date for PT Re-Evaluation 12/25/15   PT Start Time 1740   PT Stop Time 8144   PT Time Calculation (min) 62 min   Activity Tolerance Patient tolerated treatment well   Behavior During Therapy [facility] for tasks assessed/performed      Past Medical History  Diagnosis Date  . Depression   . Headache   . Arthritis     Past Surgical History  Procedure Laterality Date  . Abdominal hysterectomy    . Bowel resection    . Tonsillectomy    . Ankle fusion    . Knee surgery Bilateral     LEFT ARTHROCOPY  X3    ,  RT X1  . Total knee arthroplasty Left 10/07/2015  . Total knee arthroplasty Left 10/07/2015    Procedure: TOTAL KNEE ARTHROPLASTY;  Surgeon: Garald Balding, MD;  Location: [facility];  Service: Orthopedics;  Laterality: Left;    There were no vitals filed for this visit.      Subjective Assessment - 12/09/15 1615    Subjective Pt. reports she is starting to get sharp pain on the L lateral knee the last few days; however pt. reports she is only stiff currently    Patient Stated Goals "I want the range at 120 dg like they say I should."   Currently in Pain? No/denies   Pain Score 0-No pain   Multiple Pain Sites No      Today's Treatment:  TherEx: NuStep: level 4, 4 min   Manual: L patella mobs - all directions with emphasis on inferior L knee Flexion/Extension mobs  L HS/PF, SKTC knee flexion stretch x 30" each   TherEx:  Bridge x 15 reps  Hooklying B hip abd/ER with black TB x  Hooklying alternating hip abd/ER with black TB x 10 reps   B sidelying  clam shell with black TB x 10 reps each BATCA Knee Flexion 25# B concentric/eccentric x 15; 20# B concentric/Leccentric x10 Standing 4 way Hip SLR (flexion, ADD, extension, ABD) with green TB x 10 reps   Modalities Vasoneumatic Device to L knee: medium compression, coldest temp., supine, L LE elevated x 15'        PT Short Term Goals - 11/18/15 1134    PT SHORT TERM GOAL #1   Title Pt will be independent with initial HEP by 11/27/15   Status Achieved   PT SHORT TERM GOAL #2   Title Pt will ambulate with normal gait pattern with SPC by 11/27/15   Status Achieved           PT Long Term Goals - 12/04/15 1046    PT LONG TERM GOAL #1   Title Pt will be independent with advanced HEP/gym program by 12/25/15   Status Partially Met  12/04/15: Independent with current HEP, but continue to progress PRN   PT LONG TERM GOAL #2   Title Pt will demonstrate L knee AROM 3-120 or greater by 12/25/15   Status Partially Met  12/04/15: L knee AROM 4-110, PROM 0-112  PT LONG TERM GOAL #3   Title Pt will demonstrate L LE strength 4+/5 or greater by 12/25/15   Status On-going  12/04/15: Overall strength improving but only met at 4+/5 for L hip flexion   PT LONG TERM GOAL #4   Title Pt wil ambulate with normal gait pattern on all surfaces w/o AD by 12/25/15   Status Partially Met  12/04/15: Able to walk with normal hip/knee mechanics w/o SPC on level surfaces but demonstrates decreased weight shift to L   PT LONG TERM GOAL #5   Title Pt will negotiate stairs with good reciprocal gait pattern by 12/25/15   Status Partially Met  12/04/15: Pt able to ascend stairs with reciprocal pattern and SPC although slight hip hiking eveident. Only able to descend with [facility] and rail use with step-to pattern, with ROM and eccentric quad control limiting reciprocal descent.               Plan - 12/09/15 1618    Clinical Impression Statement Pt. reports she is starting to get sharp pain on the L lateral knee the last  few days; however pt. reports she is only stiff currently.  Pt. continues to perform well with hip / knee strengthening in supine however still very challanged with eccentric stepping control on L quad.  Pt. just now beginning to tolerated 4" step over with L quad eccentric lowering on L knee; ice / compression to L knee continued today due to L knee pain / irritation following stepping activities.     PT Treatment/Interventions Patient/family education;Therapeutic exercise;Manual techniques;Passive range of motion;Scar mobilization;Therapeutic activities;Functional mobility training;Gait training;Stair training;Cryotherapy;Vasopneumatic Device;Electrical Stimulation;ADLs/Self Care Home Management   PT Next Visit Plan TKE activities, L hip / knee strengthening activities; modalities as indicated, review of gait training w/o AD & reciprocal stair training as indicated      Patient will benefit from skilled therapeutic intervention in order to improve the following deficits and impairments:  Pain, Decreased range of motion, Impaired flexibility, Decreased strength, Abnormal gait, Difficulty walking, Decreased activity tolerance, Decreased balance, Decreased endurance, Decreased scar mobility, Increased edema  Visit Diagnosis: Pain in left knee  Stiffness of left knee, not elsewhere classified  Difficulty in walking, not elsewhere classified  Other abnormalities of gait and mobility     Problem List Patient Active Problem List   Diagnosis Date Noted  . Postoperative anemia due to acute blood loss 10/11/2015  . Arthritis 10/11/2015  . Constipation 10/11/2015  . Headache 10/11/2015  . Osteoarthritis of left knee 10/07/2015  . Obesity 10/07/2015  . S/P total knee replacement using cement 10/07/2015    Bess Harvest, PTA 12/10/2015, 1:16 PM  [facility] [address]  [place], [place], [address] Phone: [phone]   Fax:   [phone]  Name: Alethea Terhaar MRN: [id number] Date of Birth: 1950/09/04

## 2015-12-11 ENCOUNTER — Ambulatory Visit: Payer: BLUE CROSS/BLUE SHIELD | Admitting: Physical Therapy

## 2015-12-11 DIAGNOSIS — M25562 Pain in left knee: Secondary | ICD-10-CM

## 2015-12-11 DIAGNOSIS — R2689 Other abnormalities of gait and mobility: Secondary | ICD-10-CM

## 2015-12-11 DIAGNOSIS — M25662 Stiffness of left knee, not elsewhere classified: Secondary | ICD-10-CM

## 2015-12-11 DIAGNOSIS — R262 Difficulty in walking, not elsewhere classified: Secondary | ICD-10-CM

## 2015-12-11 NOTE — Therapy (Signed)
[facility] [address]  [place], [place], [address] Phone: [phone]   Fax:  [phone]  Physical Therapy Treatment  Patient Details  Name: Lauren Frye MRN: [id number] Date of Birth: 1950/10/01 Referring Provider: Joni Fears, MD  Encounter Date: 12/11/2015      PT End of Session - 12/11/15 1626    Visit Number 15   Number of Visits 20   Date for PT Re-Evaluation 12/25/15   PT Start Time 1626   PT Stop Time 1714   PT Time Calculation (min) 48 min   Activity Tolerance Patient tolerated treatment well   Behavior During Therapy [facility] for tasks assessed/performed      Past Medical History  Diagnosis Date  . Depression   . Headache   . Arthritis     Past Surgical History  Procedure Laterality Date  . Abdominal hysterectomy    . Bowel resection    . Tonsillectomy    . Ankle fusion    . Knee surgery Bilateral     LEFT ARTHROCOPY  X3    ,  RT X1  . Total knee arthroplasty Left 10/07/2015  . Total knee arthroplasty Left 10/07/2015    Procedure: TOTAL KNEE ARTHROPLASTY;  Surgeon: Garald Balding, MD;  Location: [facility];  Service: Orthopedics;  Laterality: Left;    There were no vitals filed for this visit.      Subjective Assessment - 12/11/15 1629    Subjective Pt has been back to work PT this week and has noted increased stiffness/swelling with not being able to move around as much or ice in the mornings.   Currently in Pain? No/denies            [facility] PT Assessment - 12/11/15 1626    Assessment   Medical Diagnosis L TKR   Onset Date/Surgical Date 10/07/15   Next MD Visit 01/09/16   AROM   AROM Assessment Site Knee   Right/Left Knee Left   Left Knee Extension 2   Left Knee Flexion 111           Today's Treatment  TherEx Rec Bike - lvl 2 x 5' Bridge + Alternating hip abd/ER with black TB x15  Manual L patella mobs - all directions with emphasis on inferior L knee  Flexion/Extension mobs  L HS/PF, SKTC knee flexion stretch x 30" each   TherEx  BATCA Knee Flexion 25# B concentric/eccentric x15 BATCA Knee Extension 15# B concentric/eccentric x15 BATCA Leg Press 25# x15 6" L Fwd Step-up with emphasis on TKE x10 6" L Fwd Step-up + TKE with black TB  x10 6" L Lat Step-up with emphasis on TKE x10  Modalities Vasoneumatic Device to L knee: Supine with LE elevated, medium compression, coldest temp x15'          PT Short Term Goals - 11/18/15 1134    PT SHORT TERM GOAL #1   Title Pt will be independent with initial HEP by 11/27/15   Status Achieved   PT SHORT TERM GOAL #2   Title Pt will ambulate with normal gait pattern with SPC by 11/27/15   Status Achieved           PT Long Term Goals - 12/11/15 1714    PT LONG TERM GOAL #1   Title Pt will be independent with advanced HEP/gym program by 12/25/15   Status Partially Met  12/04/15: Independent with current HEP, but continue to progress PRN  PT LONG TERM GOAL #2   Title Pt will demonstrate L knee AROM 3-120 or greater by 12/25/15   Status Partially Met  12/11/15: L knee AROM 2-111, PROM 0-112   PT LONG TERM GOAL #3   Title Pt will demonstrate L LE strength 4+/5 or greater by 12/25/15   Status On-going  12/04/15: Overall strength improving but only met at 4+/5 for L hip flexion   PT LONG TERM GOAL #4   Title Pt wil ambulate with normal gait pattern on all surfaces w/o AD by 12/25/15   Status Partially Met  12/04/15: Able to walk with normal hip/knee mechanics w/o SPC on level surfaces but demonstrates decreased weight shift to L   PT LONG TERM GOAL #5   Title Pt will negotiate stairs with good reciprocal gait pattern by 12/25/15   Status Partially Met  12/04/15: Pt able to ascend stairs with reciprocal pattern and SPC although slight hip hiking evident. Only able to descend with [facility] and rail use with step-to pattern, with ROM and eccentric quad control limiting reciprocal descent.                Plan - 12/11/15 1714    Clinical Impression Statement Pt noting more stiffness this week having returned to work part-time, with L knee flexion ROM essentially unchanged this week, although extension ROM continuing to improve. Pt cleared by MD to finish PT when she feels she has met her goals, but pt currently wanting to continue with remaining scheduled visits.   PT Treatment/Interventions Patient/family education;Therapeutic exercise;Manual techniques;Passive range of motion;Scar mobilization;Therapeutic activities;Functional mobility training;Gait training;Stair training;Cryotherapy;Vasopneumatic Device;Electrical Stimulation;ADLs/Self Care Home Management   PT Next Visit Plan TKE activities, L hip / knee strengthening activities; modalities as indicated, review of gait training w/o AD & reciprocal stair training as indicated   Consulted and Agree with Plan of Care Patient      Patient will benefit from skilled therapeutic intervention in order to improve the following deficits and impairments:  Pain, Decreased range of motion, Impaired flexibility, Decreased strength, Abnormal gait, Difficulty walking, Decreased activity tolerance, Decreased balance, Decreased endurance, Decreased scar mobility, Increased edema  Visit Diagnosis: Pain in left knee  Stiffness of left knee, not elsewhere classified  Difficulty in walking, not elsewhere classified  Other abnormalities of gait and mobility     Problem List Patient Active Problem List   Diagnosis Date Noted  . Postoperative anemia due to acute blood loss 10/11/2015  . Arthritis 10/11/2015  . Constipation 10/11/2015  . Headache 10/11/2015  . Osteoarthritis of left knee 10/07/2015  . Obesity 10/07/2015  . S/P total knee replacement using cement 10/07/2015    Percival Spanish, PT, MPT 12/12/2015, 9:21 AM  [facility] [address]  [place], [place],  [address] Phone: [phone]   Fax:  [phone]  Name: Lauren Frye MRN: [id number] Date of Birth: 1950/08/12

## 2015-12-16 ENCOUNTER — Ambulatory Visit: Payer: BLUE CROSS/BLUE SHIELD

## 2015-12-16 DIAGNOSIS — R2689 Other abnormalities of gait and mobility: Secondary | ICD-10-CM

## 2015-12-16 DIAGNOSIS — R262 Difficulty in walking, not elsewhere classified: Secondary | ICD-10-CM

## 2015-12-16 DIAGNOSIS — M25662 Stiffness of left knee, not elsewhere classified: Secondary | ICD-10-CM

## 2015-12-16 DIAGNOSIS — M25562 Pain in left knee: Secondary | ICD-10-CM

## 2015-12-16 NOTE — Therapy (Signed)
[facility] [address]  [place], [place], [address] Phone: [phone]   Fax:  [phone]  Physical Therapy Treatment  Patient Details  Name: Lauren Frye MRN: [id number] Date of Birth: May 25, 1951 Referring Provider: Joni Fears, MD  Encounter Date: 12/16/2015      PT End of Session - 12/16/15 1641    Visit Number 16   Number of Visits 20   Date for PT Re-Evaluation 12/25/15   PT Start Time 1623   PT Stop Time 1713   PT Time Calculation (min) 50 min   Activity Tolerance Patient tolerated treatment well   Behavior During Therapy [facility] for tasks assessed/performed      Past Medical History  Diagnosis Date  . Depression   . Headache   . Arthritis     Past Surgical History  Procedure Laterality Date  . Abdominal hysterectomy    . Bowel resection    . Tonsillectomy    . Ankle fusion    . Knee surgery Bilateral     LEFT ARTHROCOPY  X3    ,  RT X1  . Total knee arthroplasty Left 10/07/2015  . Total knee arthroplasty Left 10/07/2015    Procedure: TOTAL KNEE ARTHROPLASTY;  Surgeon: Garald Balding, MD;  Location: [facility];  Service: Orthopedics;  Laterality: Left;    There were no vitals filed for this visit.      Subjective Assessment - 12/16/15 1628    Subjective Pt. reports only tightness today in the L knee; pt. reports working full time currently with increased swelling at L knee due to this.     Patient Stated Goals "I want the range at 120 dg like they say I should."   Currently in Pain? No/denies   Pain Score 0-No pain   Multiple Pain Sites No      Today's treatment:  Therex:  L HS, SKTC stretch x 30 sec each   Manual: L knee Anterior posterior mobs    Therex: HS curl with heels on peanut p-ball x 15 reps  Straight leg bridge with heels on peanut p-ball x 15 reps Hooklying bridge x 15 reps  Hooklying bridge with B hip abd/ER with black TB x 10 reps Hooklying bridge with  isometric / ER with black TB x 10 reps  4" step over x 10 reps each way; 2 pole assist; leading with R LE  6" step over x 10 reps each way; 2 pole assist; leading with L LE L Dorsiflexion rocker stretch x 30 sec each   Vasoneumatic device to L knee: L LE elevated, medium compression, coldest temp., 10 min          PT Short Term Goals - 11/18/15 1134    PT SHORT TERM GOAL #1   Title Pt will be independent with initial HEP by 11/27/15   Status Achieved   PT SHORT TERM GOAL #2   Title Pt will ambulate with normal gait pattern with SPC by 11/27/15   Status Achieved           PT Long Term Goals - 12/11/15 1714    PT LONG TERM GOAL #1   Title Pt will be independent with advanced HEP/gym program by 12/25/15   Status Partially Met  12/04/15: Independent with current HEP, but continue to progress PRN   PT LONG TERM GOAL #2   Title Pt will demonstrate L knee AROM 3-120 or greater by 12/25/15   Status  Partially Met  12/11/15: L knee AROM 2-111, PROM 0-112   PT LONG TERM GOAL #3   Title Pt will demonstrate L LE strength 4+/5 or greater by 12/25/15   Status On-going  12/04/15: Overall strength improving but only met at 4+/5 for L hip flexion   PT LONG TERM GOAL #4   Title Pt wil ambulate with normal gait pattern on all surfaces w/o AD by 12/25/15   Status Partially Met  12/04/15: Able to walk with normal hip/knee mechanics w/o SPC on level surfaces but demonstrates decreased weight shift to L   PT LONG TERM GOAL #5   Title Pt will negotiate stairs with good reciprocal gait pattern by 12/25/15   Status Partially Met  12/04/15: Pt able to ascend stairs with reciprocal pattern and SPC although slight hip hiking evident. Only able to descend with [facility] and rail use with step-to pattern, with ROM and eccentric quad control limiting reciprocal descent.               Plan - 12/16/15 1643    Clinical Impression Statement Pt. reports only tightness today in the L knee; pt. reports working full time  currently with increased swelling at L knee due to this.  Pt. tolerated advancement of stepping activities today well with TKE focus; 4" eccentric step over well tolerated with good control today.     PT Treatment/Interventions Patient/family education;Therapeutic exercise;Manual techniques;Passive range of motion;Scar mobilization;Therapeutic activities;Functional mobility training;Gait training;Stair training;Cryotherapy;Vasopneumatic Device;Electrical Stimulation;ADLs/Self Care Home Management   PT Next Visit Plan TKE activities, L hip / knee strengthening activities; modalities as indicated, review of gait training w/o AD & reciprocal stair training as indicated      Patient will benefit from skilled therapeutic intervention in order to improve the following deficits and impairments:  Pain, Decreased range of motion, Impaired flexibility, Decreased strength, Abnormal gait, Difficulty walking, Decreased activity tolerance, Decreased balance, Decreased endurance, Decreased scar mobility, Increased edema  Visit Diagnosis: Pain in left knee  Stiffness of left knee, not elsewhere classified  Difficulty in walking, not elsewhere classified  Other abnormalities of gait and mobility     Problem List Patient Active Problem List   Diagnosis Date Noted  . Postoperative anemia due to acute blood loss 10/11/2015  . Arthritis 10/11/2015  . Constipation 10/11/2015  . Headache 10/11/2015  . Osteoarthritis of left knee 10/07/2015  . Obesity 10/07/2015  . S/P total knee replacement using cement 10/07/2015    Bess Harvest, PTA 12/16/2015, 5:21 PM  [facility] [address]  [place], [place], [address] Phone: [phone]   Fax:  [phone]  Name: Lauren Frye MRN: [id number] Date of Birth: 01/02/1951

## 2015-12-18 ENCOUNTER — Ambulatory Visit: Payer: BLUE CROSS/BLUE SHIELD | Admitting: Physical Therapy

## 2015-12-18 DIAGNOSIS — R2689 Other abnormalities of gait and mobility: Secondary | ICD-10-CM

## 2015-12-18 DIAGNOSIS — M25562 Pain in left knee: Secondary | ICD-10-CM | POA: Diagnosis not present

## 2015-12-18 DIAGNOSIS — M25662 Stiffness of left knee, not elsewhere classified: Secondary | ICD-10-CM

## 2015-12-18 DIAGNOSIS — R262 Difficulty in walking, not elsewhere classified: Secondary | ICD-10-CM

## 2015-12-18 NOTE — Therapy (Signed)
[facility] [address]  [address] [place], [place], [address] Phone: [phone]   Fax:  [phone]  Physical Therapy Treatment  Patient Details  Name: Lauren Frye MRN: [id number] Date of Birth: 07/03/1951 Referring Provider: Peter Whitfield, MD  Encounter Date: 12/18/2015      PT End of Session - 12/18/15 1620    Visit Number 17   Number of Visits 20   Date for PT Re-Evaluation 12/25/15   PT Start Time 1619   PT Stop Time 1720   PT Time Calculation (min) 61 min   Activity Tolerance Patient tolerated treatment well   Behavior During Therapy [facility] for tasks assessed/performed      Past Medical History  Diagnosis Date  . Depression   . Headache   . Arthritis     Past Surgical History  Procedure Laterality Date  . Abdominal hysterectomy    . Bowel resection    . Tonsillectomy    . Ankle fusion    . Knee surgery Bilateral     LEFT ARTHROCOPY  X3    ,  RT X1  . Total knee arthroplasty Left 10/07/2015  . Total knee arthroplasty Left 10/07/2015    Procedure: TOTAL KNEE ARTHROPLASTY;  Surgeon: Peter W Whitfield, MD;  [facility];  Service: Orthopedics;  Laterality: Left;    There were no vitals filed for this visit.      Subjective Assessment - 12/18/15 1629    Subjective Pt reports increased swelling over the past few days, so she has been wearing a compression stocking x 2 days. States she has            [facility] PT Assessment - 12/18/15 1619    AROM   AROM Assessment Site Knee   Right/Left Knee Left   Left Knee Extension 2   Left Knee Flexion 111           Today's Treatment  TherEx Rec Bike - lvl 2 x 5' TRX squat x15 BATCA Leg Press 25# x20 L Heelcord stretch on Prostretch 3x30" BATCA Knee Flexion 25# x10 B concentric/eccentric, x10 B concentric/L eccentric BATCA Knee Extension 20# x10 B concentric/eccentric, #15 x10 B concentric/L eccentric HS Curl manually rolling TM belt x10 TKE  manually rolling TM belt x10 8" L Fwd Step-up + TKE with black TBx10 8" L Lat Step-up with emphasis on TKE x10 6" Step over leading with L LE x10, 1 pole assist  Manual L patella mobs - all directions with emphasis on inferior L knee Flexion/Extension mobs  L HS/PF, SKTC knee flexion stretch x 30" each   Modalities Vasoneumatic Device to L knee: Supine with LE elevated, medium compression, coldest temp x15'           PT Short Term Goals - 11/18/15 1134    PT SHORT TERM GOAL #1   Title Pt will be independent with initial HEP by 11/27/15   Status Achieved   PT SHORT TERM GOAL #2   Title Pt will ambulate with normal gait pattern with SPC by 11/27/15   Status Achieved           PT Long Term Goals - 12/18/15 1705    PT LONG TERM GOAL #1   Title Pt will be independent with advanced HEP/gym program by 12/25/15   Status Partially Met  12/04/15: Independent with current HEP, but continue to progress PRN   PT LONG TERM GOAL #2   Title Pt will   demonstrate L knee AROM 3-120 or greater by 12/25/15   Status Partially Met  12/11/15: L knee AROM 2-111, PROM 0-112   PT LONG TERM GOAL #3   Title Pt will demonstrate L LE strength 4+/5 or greater by 12/25/15   Status On-going  12/04/15: Overall strength improving but only met at 4+/5 for L hip flexion   PT LONG TERM GOAL #4   Title Pt wil ambulate with normal gait pattern on all surfaces w/o AD by 12/25/15   Status Partially Met  12/04/15: Able to walk with normal hip/knee mechanics w/o SPC on level surfaces but demonstrates decreased weight shift to L   PT LONG TERM GOAL #5   Title Pt will negotiate stairs with good reciprocal gait pattern by 12/25/15   Status Partially Met  12/04/15: Pt able to ascend stairs with reciprocal pattern and SPC although slight hip hiking evident. Only able to descend with SPC and rail use with step-to pattern, with ROM and eccentric quad control limiting reciprocal descent.               Plan - 12/18/15  1705    Clinical Impression Statement Pt noting continued increased L knee swelling with working up to 10 hrs where she is mostly sedentary at her desk, therefore has started wearing her compression stockings again for the past 2 days. No increased pain reported. ROM unchanged from last measurement, mostly like due to increased edema. Pt tolerating exercise progression with some fatigue noted but no increased apin.   PT Treatment/Interventions Patient/family education;Therapeutic exercise;Manual techniques;Passive range of motion;Scar mobilization;Therapeutic activities;Functional mobility training;Gait training;Stair training;Cryotherapy;Vasopneumatic Device;Electrical Stimulation;ADLs/Self Care Home Management   PT Next Visit Plan TKE activities, L hip / knee strengthening activities; modalities as indicated, review of gait training w/o AD & reciprocal stair training as indicated      Patient will benefit from skilled therapeutic intervention in order to improve the following deficits and impairments:  Pain, Decreased range of motion, Impaired flexibility, Decreased strength, Abnormal gait, Difficulty walking, Decreased activity tolerance, Decreased balance, Decreased endurance, Decreased scar mobility, Increased edema  Visit Diagnosis: Pain in left knee  Stiffness of left knee, not elsewhere classified  Difficulty in walking, not elsewhere classified  Other abnormalities of gait and mobility     Problem List Patient Active Problem List   Diagnosis Date Noted  . Postoperative anemia due to acute blood loss 10/11/2015  . Arthritis 10/11/2015  . Constipation 10/11/2015  . Headache 10/11/2015  . Osteoarthritis of left knee 10/07/2015  . Obesity 10/07/2015  . S/P total knee replacement using cement 10/07/2015    JoAnne M Kreis, PT, MPT 12/18/2015, 7:15 PM  [facility] [address]  [address] [place], [place], [address] Phone:  [phone]   Fax:  [phone]  Name: Lauren Frye MRN: [id number] Date of Birth: 03/30/1951    

## 2015-12-23 ENCOUNTER — Ambulatory Visit: Payer: BLUE CROSS/BLUE SHIELD

## 2015-12-23 DIAGNOSIS — R2689 Other abnormalities of gait and mobility: Secondary | ICD-10-CM

## 2015-12-23 DIAGNOSIS — M25562 Pain in left knee: Secondary | ICD-10-CM | POA: Diagnosis not present

## 2015-12-23 DIAGNOSIS — R262 Difficulty in walking, not elsewhere classified: Secondary | ICD-10-CM

## 2015-12-23 DIAGNOSIS — M25662 Stiffness of left knee, not elsewhere classified: Secondary | ICD-10-CM

## 2015-12-23 NOTE — Therapy (Signed)
[facility] [address]  [place], [place], [address] Phone: [phone]   Fax:  [phone]  Physical Therapy Treatment  Patient Details  Name: Lauren Frye MRN: [id number] Date of Birth: 02/24/1951 Referring Provider: Joni Fears, MD  Encounter Date: 12/23/2015      PT End of Session - 12/23/15 1632    Visit Number 18   Number of Visits 20   Date for PT Re-Evaluation 12/25/15   PT Start Time 1062   PT Stop Time 1713   PT Time Calculation (min) 56 min   Activity Tolerance Patient tolerated treatment well   Behavior During Therapy [facility] for tasks assessed/performed      Past Medical History  Diagnosis Date  . Depression   . Headache   . Arthritis     Past Surgical History  Procedure Laterality Date  . Abdominal hysterectomy    . Bowel resection    . Tonsillectomy    . Ankle fusion    . Knee surgery Bilateral     LEFT ARTHROCOPY  X3    ,  RT X1  . Total knee arthroplasty Left 10/07/2015  . Total knee arthroplasty Left 10/07/2015    Procedure: TOTAL KNEE ARTHROPLASTY;  Surgeon: Garald Balding, MD;  Location: [place];  Service: Orthopedics;  Laterality: Left;    There were no vitals filed for this visit.      Subjective Assessment - 12/23/15 1621    Subjective Pt. reports L knee pain is a 3/10 today; pt. reports she is recovering from bronchitis.     Patient Stated Goals "I want the range at 120 dg like they say I should."   Currently in Pain? Yes   Pain Score 3    Pain Location Knee   Pain Orientation Left   Pain Descriptors / Indicators Tightness;Aching   Pain Type Surgical pain   Pain Onset 1 to 4 weeks ago   Pain Frequency Intermittent   Multiple Pain Sites No     Today's treatment:  Therex: NuStep: 5 min, level 3  L HS, glute, SKTC stretch x 30 sec  SL Bridging with heels on peanut p-ball x 15 reps  Bridge + alternating hip abd/ER with black TB x 10 reps each side (sustained  bridge) B sidelying clam shell with black TB x 10 reps each side Alternating high knee marching on airex pad 2 x 1 min  B 6" step up with black TB pulling into flexion x 15 reps each leg BATCA leg press machine x 35# x 15 reps BATCA HS curl machine 35# x 15 reps; B concentric/eccentric  BATCA HS curl machine 25# x 15 reps; B concentric/L eccentric  Heel raises on UBE x 15 reps: B concentric, L eccentric   Vasoneumatic device to L knee: medium compression, coldest temp., 15 min, L LE elevated         PT Short Term Goals - 11/18/15 1134    PT SHORT TERM GOAL #1   Title Pt will be independent with initial HEP by 11/27/15   Status Achieved   PT SHORT TERM GOAL #2   Title Pt will ambulate with normal gait pattern with SPC by 11/27/15   Status Achieved           PT Long Term Goals - 12/18/15 1705    PT LONG TERM GOAL #1   Title Pt will be independent with advanced HEP/gym program by 12/25/15   Status  Partially Met  12/04/15: Independent with current HEP, but continue to progress PRN   PT LONG TERM GOAL #2   Title Pt will demonstrate L knee AROM 3-120 or greater by 12/25/15   Status Partially Met  12/11/15: L knee AROM 2-111, PROM 0-112   PT LONG TERM GOAL #3   Title Pt will demonstrate L LE strength 4+/5 or greater by 12/25/15   Status On-going  12/04/15: Overall strength improving but only met at 4+/5 for L hip flexion   PT LONG TERM GOAL #4   Title Pt wil ambulate with normal gait pattern on all surfaces w/o AD by 12/25/15   Status Partially Met  12/04/15: Able to walk with normal hip/knee mechanics w/o SPC on level surfaces but demonstrates decreased weight shift to L   PT LONG TERM GOAL #5   Title Pt will negotiate stairs with good reciprocal gait pattern by 12/25/15   Status Partially Met  12/04/15: Pt able to ascend stairs with reciprocal pattern and SPC although slight hip hiking evident. Only able to descend with [facility] and rail use with step-to pattern, with ROM and eccentric quad  control limiting reciprocal descent.               Plan - 12/23/15 1633    Clinical Impression Statement Pt. reports L knee pain is a 3/10 today; pt. reports she is recovering from bronchitis.  Pt. tolerated all hip / knee strengthening with TKE strengthening focus very well today with 0/10 pain following application of ice / compression to L knee.  Pt. progressing toward goals well with plan to see PT next treatment for goal assessment.     PT Treatment/Interventions Patient/family education;Therapeutic exercise;Manual techniques;Passive range of motion;Scar mobilization;Therapeutic activities;Functional mobility training;Gait training;Stair training;Cryotherapy;Vasopneumatic Device;Electrical Stimulation;ADLs/Self Care Home Management   PT Next Visit Plan TKE activities, L hip / knee strengthening activities; modalities as indicated, review of gait training w/o AD & reciprocal stair training as indicated      Patient will benefit from skilled therapeutic intervention in order to improve the following deficits and impairments:  Pain, Decreased range of motion, Impaired flexibility, Decreased strength, Abnormal gait, Difficulty walking, Decreased activity tolerance, Decreased balance, Decreased endurance, Decreased scar mobility, Increased edema  Visit Diagnosis: Pain in left knee  Stiffness of left knee, not elsewhere classified  Difficulty in walking, not elsewhere classified  Other abnormalities of gait and mobility     Problem List Patient Active Problem List   Diagnosis Date Noted  . Postoperative anemia due to acute blood loss 10/11/2015  . Arthritis 10/11/2015  . Constipation 10/11/2015  . Headache 10/11/2015  . Osteoarthritis of left knee 10/07/2015  . Obesity 10/07/2015  . S/P total knee replacement using cement 10/07/2015    Bess Harvest, PTA 12/23/2015, 5:12 PM  [facility] [address]  [place], [place], [address] Phone: [phone]   Fax:  [phone]  Name: Lauren Frye MRN: [id number] Date of Birth: Apr 11, 1951

## 2015-12-25 ENCOUNTER — Ambulatory Visit: Payer: BLUE CROSS/BLUE SHIELD | Admitting: Physical Therapy

## 2015-12-25 DIAGNOSIS — M25562 Pain in left knee: Secondary | ICD-10-CM | POA: Diagnosis not present

## 2015-12-25 DIAGNOSIS — R2689 Other abnormalities of gait and mobility: Secondary | ICD-10-CM

## 2015-12-25 DIAGNOSIS — M25662 Stiffness of left knee, not elsewhere classified: Secondary | ICD-10-CM

## 2015-12-25 DIAGNOSIS — R262 Difficulty in walking, not elsewhere classified: Secondary | ICD-10-CM

## 2015-12-25 NOTE — Therapy (Addendum)
[facility] [address]  [place], [place], [address] Phone: [phone]   Fax:  [phone]  Physical Therapy Treatment  Patient Details  Name: Lauren Frye MRN: [id number] Date of Birth: [birth year] Referring Provider: Joni Fears, MD  Encounter Date: 12/25/2015      PT End of Session - 12/25/15 1615    Visit Number 19   Number of Visits 20   Date for PT Re-Evaluation 12/25/15   PT Start Time 6962   PT Stop Time 1713   PT Time Calculation (min) 58 min   Activity Tolerance Patient tolerated treatment well   Behavior During Therapy [facility] for tasks assessed/performed      Past Medical History  Diagnosis Date  . Depression   . Headache   . Arthritis     Past Surgical History  Procedure Laterality Date  . Abdominal hysterectomy    . Bowel resection    . Tonsillectomy    . Ankle fusion    . Knee surgery Bilateral     LEFT ARTHROCOPY  X3    ,  RT X1  . Total knee arthroplasty Left 10/07/2015  . Total knee arthroplasty Left 10/07/2015    Procedure: TOTAL KNEE ARTHROPLASTY;  Surgeon: Garald Balding, MD;  Location: [place];  Service: Orthopedics;  Laterality: Left;    There were no vitals filed for this visit.      Subjective Assessment - 12/25/15 1617    Subjective Pt continues to report more stiffness than pain at present especially in superior aspect of anterior knee. Notes continued swelling for which she still has been wearing her support stockings.   Patient Stated Goals "I want the range at 120 dg like they say I should."   Currently in Pain? No/denies            [facility] PT Assessment - 12/25/15 1615    AROM   AROM Assessment Site Knee   Right/Left Knee Left   Left Knee Extension 2   Left Knee Flexion 112   PROM   Left Knee Extension 0   Strength   Strength Assessment Site Hip;Knee   Left Hip Flexion 5/5   Left Hip Extension 4+/5   Left Hip ABduction 4+/5   Left Hip ADduction 4/5   Left Knee Flexion 4+/5   Left Knee Extension 4+/5           Today's Treatment  TherEx Rec Bike - lvl 2 x 5'  ROM/MMT assessment  Gait and stair assessment  TKE with black TB 15x3" Standing L gastroc stretch at wall x30' BATCA leg press machine x 35# x 15 reps BATCA knee extension 20# x 15 reps; B concentric/eccentric  BATCA HS curl machine 25# x 15 reps; B concentric/L eccentric   Modalities Vasoneumatic device to L knee: Supine with LE elevated, medium compression, coldest temp x15'          PT Short Term Goals - 11/18/15 1134    PT SHORT TERM GOAL #1   Title Pt will be independent with initial HEP by 11/27/15   Status Achieved   PT SHORT TERM GOAL #2   Title Pt will ambulate with normal gait pattern with SPC by 11/27/15   Status Achieved           PT Long Term Goals - 12/25/15 1631    PT LONG TERM GOAL #1   Title Pt will be independent with advanced HEP/gym program by 12/25/15  Status Achieved   PT LONG TERM GOAL #2   Title Pt will demonstrate L knee AROM 3-120 or greater by 12/25/15   Status Partially Met  L knee AROM 2-112 (pt reporting very limited L knee flexion pre-op and only 124 dg flexion on R)   PT LONG TERM GOAL #3   Title Pt will demonstrate L LE strength 4+/5 or greater by 12/25/15   Status Achieved   PT LONG TERM GOAL #4   Title Pt wil ambulate with normal gait pattern on all surfaces w/o AD by 12/25/15   Status Partially Met  Able to ambulate with good gait pattern without AD indoors except uses SPC when going to BR at work (long walk), but still takes cane when going outdoors   PT [place] #5   Title Pt will negotiate stairs with good reciprocal gait pattern by 12/25/15   Status Partially Met  Pt able to ascend stairs with good reciprocal pattern. Able to descend recpirocally but requires slow pace and conscious effort due to limited eccentric quad control.               Plan - 12/25/15 1658    Clinical Impression  Statement Pt at end of current POC and has demonstrated good progress with PT. She has been able to wean from the RW to a [facility] and now only uses cane when walking long distances or going outdoors, otherwise walks w/o AD the majority of time indoors, with good gait pattern demonstrated. She is able to ascend/descend stairs reciprocally with single rail support, with good step pattern but requires increased time/effort on descent due to mildly limited eccentric control. R knee AROM 2-112 with R LE strength symmetrical to L and at least 4+/5 or greater except B hip adduction 4/5.  Pt reports little to no pain of late, mostly just continued stiffness and swelling, therefore vasopnuematic icing applied to knee at end of session. Goals mostly met at this time, except flexion ROM only 112 dg (pt reports very limited flexion preoperatively), continued use of SPC when walking long distances or going outdoors, and decreased speed/increased effort with reciprocal stair descent. Pt has been working out with trainer at [employer] and feels ready to transition to HEP/gym workout fulltime, but will not f/u with MD until 01/09/16. Pt will be placed on hold for 30 days in the event further issues are identified at MD visit or if problems arise with transition to HEP/gym program. If no need to return within 30 days, will proceed with discharge from PT for this episode.    PT Treatment/Interventions Patient/family education;Therapeutic exercise;Manual techniques;Passive range of motion;Scar mobilization;Therapeutic activities;Functional mobility training;Gait training;Stair training;Cryotherapy;Vasopneumatic Device;Electrical Stimulation;ADLs/Self Care Home Management   PT Next Visit Plan 30 day hold, D/C from PT if no need to return w/in 30 days   Consulted and Agree with Plan of Care Patient      Patient will benefit from skilled therapeutic intervention in order to improve the following deficits and impairments:  Pain, Decreased  range of motion, Impaired flexibility, Decreased strength, Abnormal gait, Difficulty walking, Decreased activity tolerance, Decreased balance, Decreased endurance, Decreased scar mobility, Increased edema  Visit Diagnosis: Pain in left knee  Stiffness of left knee, not elsewhere classified  Difficulty in walking, not elsewhere classified  Other abnormalities of gait and mobility     Problem List Patient Active Problem List   Diagnosis Date Noted  . Postoperative anemia due to acute blood loss 10/11/2015  .  Arthritis 10/11/2015  . Constipation 10/11/2015  . Headache 10/11/2015  . Osteoarthritis of left knee 10/07/2015  . Obesity 10/07/2015  . S/P total knee replacement using cement 10/07/2015    Percival Spanish, PT, MPT 12/25/2015, 6:35 PM  [facility] [address]  [place], [place], [address] Phone: [phone]   Fax:  [phone]  Name: Lauren Frye MRN: [id number] Date of Birth: September 27, 1950   PHYSICAL THERAPY DISCHARGE SUMMARY  Visits from Start of Care: 19  Current functional level related to goals / functional outcomes:   Refer to above clinical impression. Pt has not needed to return to PT in >30 days, therefore will proceed with discharge from PT for this episode.   Remaining deficits:   As above. Pt was to continue with HEP & gym program with trainer.   Education / Equipment:   HEP  Plan: Patient agrees to discharge.  Patient goals were partially met. Patient is being discharged due to being pleased with the current functional level.  ?????   Percival Spanish, PT, MPT 02/02/16, 1:52 PM  [facility] [address]  [place], [place], [address] Phone: [phone]   Fax:  [phone]

## 2015-12-30 ENCOUNTER — Ambulatory Visit: Payer: BLUE CROSS/BLUE SHIELD

## 2016-01-01 ENCOUNTER — Encounter: Payer: BLUE CROSS/BLUE SHIELD | Admitting: Physical Therapy

## 2016-05-28 ENCOUNTER — Encounter (HOSPITAL_BASED_OUTPATIENT_CLINIC_OR_DEPARTMENT_OTHER): Payer: Self-pay

## 2016-05-28 ENCOUNTER — Emergency Department (HOSPITAL_BASED_OUTPATIENT_CLINIC_OR_DEPARTMENT_OTHER): Payer: BLUE CROSS/BLUE SHIELD

## 2016-05-28 ENCOUNTER — Emergency Department (HOSPITAL_BASED_OUTPATIENT_CLINIC_OR_DEPARTMENT_OTHER)
Admission: EM | Admit: 2016-05-28 | Discharge: 2016-05-28 | Disposition: A | Discharge status: Home / Self Care | Payer: BLUE CROSS/BLUE SHIELD | Attending: Emergency Medicine | Admitting: Emergency Medicine

## 2016-05-28 DIAGNOSIS — N390 Urinary tract infection, site not specified: Secondary | ICD-10-CM | POA: Diagnosis not present

## 2016-05-28 DIAGNOSIS — E860 Dehydration: Secondary | ICD-10-CM | POA: Diagnosis not present

## 2016-05-28 DIAGNOSIS — R109 Unspecified abdominal pain: Secondary | ICD-10-CM | POA: Diagnosis present

## 2016-05-28 LAB — URINALYSIS, ROUTINE W REFLEX MICROSCOPIC
GLUCOSE, UA: NEGATIVE mg/dL
Ketones, ur: NEGATIVE mg/dL
LEUKOCYTES UA: NEGATIVE
Nitrite: NEGATIVE
PH: 5.5 (ref 5.0–8.0)
PROTEIN: NEGATIVE mg/dL
Specific Gravity, Urine: 1.031 — ABNORMAL HIGH (ref 1.005–1.030)

## 2016-05-28 LAB — COMPREHENSIVE METABOLIC PANEL
ALBUMIN: 3.5 g/dL (ref 3.5–5.0)
ALK PHOS: 64 U/L (ref 38–126)
ALT: 61 U/L — ABNORMAL HIGH (ref 14–54)
ANION GAP: 7 (ref 5–15)
AST: 48 U/L — ABNORMAL HIGH (ref 15–41)
BUN: 22 mg/dL — ABNORMAL HIGH (ref 6–20)
CHLORIDE: 105 mmol/L (ref 101–111)
CO2: 28 mmol/L (ref 22–32)
Calcium: 9 mg/dL (ref 8.9–10.3)
Creatinine, Ser: 0.84 mg/dL (ref 0.44–1.00)
GFR calc Af Amer: >60 mL/min (ref >60)
GFR calc non Af Amer: >60 mL/min (ref >60)
GLUCOSE: 118 mg/dL — AB (ref 65–99)
POTASSIUM: 3.7 mmol/L (ref 3.5–5.1)
SODIUM: 140 mmol/L (ref 135–145)
Total Bilirubin: 0.2 mg/dL — ABNORMAL LOW (ref 0.3–1.2)
Total Protein: 6.2 g/dL — ABNORMAL LOW (ref 6.5–8.1)

## 2016-05-28 LAB — CBC WITH DIFFERENTIAL/PLATELET
BASOS PCT: 0 %
Basophils Absolute: 0 10*3/uL (ref 0.0–0.1)
EOS ABS: 0.1 10*3/uL (ref 0.0–0.7)
Eosinophils Relative: 2 %
HCT: 37.6 % (ref 36.0–46.0)
HEMOGLOBIN: 12.7 g/dL (ref 12.0–15.0)
Lymphocytes Relative: 27 %
Lymphs Abs: 1.6 10*3/uL (ref 0.7–4.0)
MCH: 30.6 pg (ref 26.0–34.0)
MCHC: 33.8 g/dL (ref 30.0–36.0)
MCV: 90.6 fL (ref 78.0–100.0)
Monocytes Absolute: 0.5 10*3/uL (ref 0.1–1.0)
Monocytes Relative: 9 %
NEUTROS PCT: 62 %
Neutro Abs: 3.5 10*3/uL (ref 1.7–7.7)
PLATELETS: 224 10*3/uL (ref 150–400)
RBC: 4.15 MIL/uL (ref 3.87–5.11)
RDW: 12.4 % (ref 11.5–15.5)
WBC: 5.8 10*3/uL (ref 4.0–10.5)

## 2016-05-28 LAB — URINE MICROSCOPIC-ADD ON

## 2016-05-28 LAB — LIPASE, BLOOD: Lipase: 16 U/L (ref 11–51)

## 2016-05-28 MED ORDER — SODIUM CHLORIDE 0.9 % IV BOLUS (SEPSIS): 1000.0000 mL | Freq: Once | INTRAVENOUS | Status: DC

## 2016-05-28 MED ORDER — CIPROFLOXACIN HCL 500 MG PO TABS: 500.0000 mg | ORAL_TABLET | Freq: Two times a day (BID) | ORAL | 0 refills | Status: DC

## 2016-05-28 MED ORDER — IOPAMIDOL (ISOVUE-300) INJECTION 61%
100.0000 mL | Freq: Once | INTRAVENOUS | Status: AC | PRN
  Administered 2016-05-28: 100 mL via INTRAVENOUS

## 2016-05-28 MED ORDER — SODIUM CHLORIDE 0.9 % IV BOLUS (SEPSIS)
1000.0000 mL | Freq: Once | INTRAVENOUS | Status: AC
  Administered 2016-05-28: 1000 mL via INTRAVENOUS

## 2016-05-28 MED FILL — CIPROFLOXACIN HCL 500 MG TA: 500 | 7 days supply | Qty: 14 | Fill #0

## 2016-05-28 NOTE — Discharge Instructions (Signed)
Cipro as prescribed until all gone. Continue fluids at home. Follow up with family doctor. Return if worsening.

## 2016-05-28 NOTE — ED Provider Notes (Signed)
[facility] Provider Note   CSN: [id number] Arrival date & time: 05/28/16  1153     History   Chief Complaint Chief Complaint  Patient presents with  . Back Pain    HPI Lauren AguasSusan Frye is a 65 y.o. female.  HPI Lauren AguasSusan Gonsalves is a 65 y.o. female presents to emergency department complaining of right flank pain, difficulty urinating, generalized weakness. Patient states 3 days ago she developed nausea, vomiting, diarrhea. She states that yesterday nausea, vomiting and diarrhea has stopped. She continued to have some lower abdominal pain that now radiates into the right flank. She denies any fever or chills. She is able to eat and drink now. She denies any vaginal discharge or bleeding, patient has had hysterectomy. She took some Motrin earlier today which has not helped her pain. She denies any dysuria, no urinary frequency or urgency, no hematuria. Pain in the flank is worsened with movement. Pain is dull.   Past Medical History:  Diagnosis Date  . Arthritis   . Depression   . Headache     Patient Active Problem List   Diagnosis Date Noted  . Postoperative anemia due to acute blood loss 10/11/2015  . Arthritis 10/11/2015  . Constipation 10/11/2015  . Headache 10/11/2015  . Osteoarthritis of left knee 10/07/2015  . Obesity 10/07/2015  . S/P total knee replacement using cement 10/07/2015    Past Surgical History:  Procedure Laterality Date  . ABDOMINAL HYSTERECTOMY    . ANKLE FUSION    . BOWEL RESECTION    . KNEE SURGERY Bilateral    LEFT ARTHROCOPY  X3    ,  RT X1  . TONSILLECTOMY    . TOTAL KNEE ARTHROPLASTY Left 10/07/2015  . TOTAL KNEE ARTHROPLASTY Left 10/07/2015   Procedure: TOTAL KNEE ARTHROPLASTY;  Surgeon: Valeria BatmanPeter W Whitfield, MD;  [facility];  Service: Orthopedics;  Laterality: Left;    OB History    No data available       Home Medications    Prior to Admission medications   Medication Sig Start Date End Date Taking? Authorizing Provider    HYDROmorphone (DILAUDID) 2 MG tablet Take 1-2 tablets (2-4 mg total) by mouth every 4 (four) hours as needed for severe pain (1 - 2 TABLETS Q 4H PRN PAIN). Patient taking differently: Take 2 mg by mouth every 6 (six) hours as needed for severe pain.  10/20/15   Kermit Baloiffany L Reed, DO    Family History No family history on file.  Social History Social History  Substance Use Topics  . Smoking status: Never Smoker  . Smokeless tobacco: Never Used  . Alcohol use 0.6 oz/week    1 Glasses of wine per week     Comment: once week     Allergies   Codeine; Penicillins; Propoxyphene; and Prednisone   Review of Systems Review of Systems  Constitutional: Negative for chills and fever.  Respiratory: Negative for cough, chest tightness and shortness of breath.   Cardiovascular: Negative for chest pain, palpitations and leg swelling.  Gastrointestinal: Positive for abdominal pain, diarrhea and nausea.  Genitourinary: Positive for difficulty urinating and flank pain. Negative for dysuria and pelvic pain.  Musculoskeletal: Negative for arthralgias, myalgias, neck pain and neck stiffness.  Skin: Negative for rash.  Neurological: Negative for dizziness, weakness and headaches.  All other systems reviewed and are negative.    Physical Exam Updated Vital Signs BP 138/79   Pulse 85   Temp 98.3 F (36.8 C) (Oral)  Resp 18   Ht 5\' 6"  (1.676 m)   Wt 91.2 kg   SpO2 100%   BMI 32.44 kg/m   Physical Exam  Constitutional: She appears well-developed and well-nourished. No distress.  HENT:  Head: Normocephalic.  Eyes: Conjunctivae are normal.  Neck: Neck supple.  Cardiovascular: Normal rate, regular rhythm and normal heart sounds.   Pulmonary/Chest: Effort normal and breath sounds normal. No respiratory distress. She has no wheezes. She has no rales.  Abdominal: Soft. Bowel sounds are normal. She exhibits no distension. There is tenderness. There is no rebound.  RLQ tenderenss. Right cva  tenderness  Musculoskeletal: She exhibits no edema.  Neurological: She is alert.  Skin: Skin is warm and dry.  Psychiatric: She has a normal mood and affect. Her behavior is normal.  Nursing note and vitals reviewed.    ED Treatments / Results  Labs (all labs ordered are listed, but only abnormal results are displayed) Labs Reviewed  URINALYSIS, ROUTINE W REFLEX MICROSCOPIC (NOT AT [facility]) - Abnormal; Notable for the following:       Result Value   Color, Urine AMBER (*)    APPearance CLOUDY (*)    Specific Gravity, Urine 1.031 (*)    Hgb urine dipstick TRACE (*)    Bilirubin Urine SMALL (*)    All other components within normal limits  URINE MICROSCOPIC-ADD ON - Abnormal; Notable for the following:    Squamous Epithelial / LPF 0-5 (*)    Bacteria, UA MANY (*)    Crystals CA OXALATE CRYSTALS (*)    All other components within normal limits  COMPREHENSIVE METABOLIC PANEL - Abnormal; Notable for the following:    Glucose, Bld 118 (*)    BUN 22 (*)    Total Protein 6.2 (*)    AST 48 (*)    ALT 61 (*)    Total Bilirubin 0.2 (*)    All other components within normal limits  URINE CULTURE  CBC WITH DIFFERENTIAL/PLATELET  LIPASE, BLOOD    EKG  EKG Interpretation None       Radiology Ct Abdomen Pelvis W Contrast  Result Date: 05/28/2016 CLINICAL DATA:  Right lower quadrant pain for 2-3 days EXAM: CT ABDOMEN AND PELVIS WITH CONTRAST TECHNIQUE: Multidetector CT imaging of the abdomen and pelvis was performed using the standard protocol following bolus administration of intravenous contrast. CONTRAST:  ISOVUE-300 COMPARISON:  None. FINDINGS: Lower chest: No acute abnormality. Hepatobiliary: No focal liver abnormality is seen. No gallstones, gallbladder wall thickening, or biliary dilatation. Pancreas: Unremarkable. No pancreatic ductal dilatation or surrounding inflammatory changes. Spleen: Normal in size without focal abnormality. Adrenals/Urinary Tract: Adrenal glands are  unremarkable. Kidneys are normal, without renal calculi, focal lesion, or hydronephrosis. Bladder is unremarkable. Stomach/Bowel: The appendix is not well visualized. No inflammatory changes are identified to suggest appendicitis. No obstructive changes are seen. Vascular/Lymphatic: No significant vascular findings are present. No enlarged abdominal or pelvic lymph nodes. Reproductive: Status post hysterectomy. No adnexal masses. Other: No abdominal wall hernia or abnormality. No abdominopelvic ascites. 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 No acute bony abnormality is seen. IMPRESSION: Chronic changes as described above without acute abnormality. Electronically Signed   By: Alcide Clever M.D.   On: 05/28/2016 16:39    Procedures Procedures (including critical care time)  Medications Ordered in ED Medications  sodium chloride 0.9 % bolus 1,000 mL (not administered)  sodium chloride 0.9 % bolus 1,000 mL (1,000 mLs Intravenous New Bag/Given 05/28/16  1442)  iopamidol (ISOVUE-300) 61 % injection 100 mL (100 mLs Intravenous Contrast Given 05/28/16 1618)     Initial Impression / Assessment and Plan / ED Course  I have reviewed the triage vital signs and the nursing notes.  Pertinent labs & imaging results that were available during my care of the patient were reviewed by me and considered in my medical decision making (see chart for details).  Clinical Course     Patient with right lower quadrant abdominal pain radiating to the right flank, pain is dull. Urinalysis obtained in triage shows concentrated urine with specific gravity of 1.031. Patient with many bacteria, also calcium oxalate crystals. Consistent with possible dehydration, will give fluids, check labs.   Normal white blood cell count, labs consistent with dehydration. Given persistent right low quadrant tenderness will get CT abdomen and pelvis.   Patient's CT scan with no acute findings.  Patient is feeling better after IV fluids. She has only received 1 L of fluid so far, but does not want to wait for the second one to go in. Will treat as UTI/possible early pyelonephritis, urine culture sent. Home with cipro, oral fluids, follow up as needed.  Vitals:   05/28/16 1339 05/28/16 1500 05/28/16 1530 05/28/16 1600  BP: 112/81 115/72 116/84 138/79  Pulse: 79 71 70 85  Resp: 18 18  18   Temp:      TempSrc:      SpO2: 97% 100% 99% 100%  Weight:      Height:         Final Clinical Impressions(s) / ED Diagnoses   Final diagnoses:  Urinary tract infection without hematuria, site unspecified  Dehydration    New Prescriptions New Prescriptions   CIPROFLOXACIN (CIPRO) 500 MG TABLET    Take 1 tablet (500 mg total) by mouth every 12 (twelve) hours.     Jaynie Crumbleatyana Robyn Galati, PA-C 05/28/16 1708    Jerelyn ScottMartha Linker, MD 05/29/16 [phone]

## 2016-05-28 NOTE — ED Triage Notes (Signed)
C/o n/v/d x 2-3 days-has stopped-now c/o lower back pain and decreased UO-NAD-steady gait

## 2016-05-29 LAB — URINE CULTURE

## 2016-07-16 ENCOUNTER — Ambulatory Visit: Payer: BLUE CROSS/BLUE SHIELD | Admitting: Rheumatology

## 2016-07-16 ENCOUNTER — Ambulatory Visit (INDEPENDENT_AMBULATORY_CARE_PROVIDER_SITE_OTHER): Payer: BLUE CROSS/BLUE SHIELD | Admitting: Orthopaedic Surgery

## 2016-07-16 ENCOUNTER — Encounter (INDEPENDENT_AMBULATORY_CARE_PROVIDER_SITE_OTHER): Payer: Self-pay | Admitting: Orthopaedic Surgery

## 2016-07-16 VITALS — BP 100/72 | HR 90 | Ht 65.5 in | Wt 200.0 lb

## 2016-07-16 DIAGNOSIS — M722 Plantar fascial fibromatosis: Secondary | ICD-10-CM

## 2016-07-16 DIAGNOSIS — Z96652 Presence of left artificial knee joint: Secondary | ICD-10-CM | POA: Diagnosis not present

## 2016-07-16 MED ORDER — LIDOCAINE HCL 1 % IJ SOLN
1.0000 mL | INTRAMUSCULAR | Status: AC | PRN
  Administered 2016-07-16: 1 mL

## 2016-07-16 MED ORDER — METHYLPREDNISOLONE ACETATE 40 MG/ML IJ SUSP
40.0000 mg | INTRAMUSCULAR | Status: AC | PRN
  Administered 2016-07-16: 40 mg

## 2016-07-16 NOTE — Progress Notes (Signed)
Office Visit Note   Patient: Lauren Frye           Date of Birth: 1951-02-16           MRN: [id number] Visit Date: 07/16/2016              Requested by: No referring provider defined for this encounter. PCP: No PCP Per Patient   Assessment & Plan: Visit Diagnoses9 months S/P left TKR, right plantar fasciitis:   Plan:continue home exercises, inject right heel  Follow-Up Instructions: No Follow-up on file.   Orders:  No orders of the defined types were placed in this encounter.  No orders of the defined types were placed in this encounter.     Procedures: Foot Inj Date/Time: 07/16/2016 8:37 AM Performed by: Valeria Batman Authorized by: Valeria Batman   Consent Given by:  Patient Condition: Plantar Fasciitis   Location: right plantar fascia muscle   Medications:  1 mL lidocaine 1 %; 40 mg methylPREDNISolone acetate 40 MG/ML Patient Tolerance:  Patient tolerated the procedure well with no immediate complications     Clinical Data: No additional findings.   Subjective: Chief Complaint  Patient presents with  . Left Knee - Routine Post Op    Pt had Left TKA on 10/07/2015.  There is swelling below the incision area that has been there since the surgery.  Pt works with a Psychologist, educational (PT) and feels she is lacking ROM and getting "tighter".  Pt gets sharp pain above and below the incision area.  L[place]ell relates that the pain is minimal. She feels like she's losing a little range of motion. Not using any ambulatory aid or taking any pain medicine.  Review of Systems   Objective: Vital Signs: There were no vitals taken for this visit.  Physical Exam  Ortho Exam left knee with full quick extension. No instability with a varus or valgus stress. No effusion. Every 4 hours formation of longitudinal scar but without pain. No swelling distally. Neurovascular exam is intact. Flexes to 104 with a goniometer.  Right heel with tenderness directly over the plantar  fascia insertion. Skin is intact. No erythema or ecchymosis neurovascular exam intact  Specialty Comments:  No specialty comments available.  Imaging: No results found.   PMFS History: Patient Active Problem List   Diagnosis Date Noted  . Postoperative anemia due to acute blood loss 10/11/2015  . Arthritis 10/11/2015  . Constipation 10/11/2015  . Headache 10/11/2015  . Osteoarthritis of left knee 10/07/2015  . Obesity 10/07/2015  . 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 Arthritis   . Depression   . Headache     No family history on file.  Past Surgical History:  Procedure Laterality Date  . ABDOMINAL HYSTERECTOMY    . ANKLE FUSION    . BOWEL RESECTION    . KNEE SURGERY Bilateral    LEFT ARTHROCOPY  X3    ,  RT X1  . TONSILLECTOMY    . TOTAL KNEE ARTHROPLASTY Left 10/07/2015  . TOTAL KNEE ARTHROPLASTY Left 10/07/2015   Procedure: TOTAL KNEE ARTHROPLASTY;  Surgeon: Valeria Batman, MD;  Location: [facility] OR;  Service: Orthopedics;  Laterality: Left;   Social History   Occupational History  . Not on file.   Social History Main Topics  . Smoking status: Never Smoker  . Smokeless tobacco: Never Used  . Alcohol use 0.6 oz/week    1 Glasses of  wine per week     Comment: once week  . Drug use: No  . Sexual activity: No

## 2016-11-11 ENCOUNTER — Ambulatory Visit (INDEPENDENT_AMBULATORY_CARE_PROVIDER_SITE_OTHER): Payer: Self-pay

## 2016-11-11 ENCOUNTER — Encounter (INDEPENDENT_AMBULATORY_CARE_PROVIDER_SITE_OTHER): Payer: Self-pay | Admitting: Orthopaedic Surgery

## 2016-11-11 ENCOUNTER — Ambulatory Visit (INDEPENDENT_AMBULATORY_CARE_PROVIDER_SITE_OTHER): Payer: BLUE CROSS/BLUE SHIELD | Admitting: Orthopaedic Surgery

## 2016-11-11 VITALS — BP 123/72 | HR 88 | Ht 66.0 in | Wt 203.0 lb

## 2016-11-11 DIAGNOSIS — M25562 Pain in left knee: Secondary | ICD-10-CM

## 2016-11-11 DIAGNOSIS — Z96652 Presence of left artificial knee joint: Secondary | ICD-10-CM | POA: Diagnosis not present

## 2016-11-11 NOTE — Progress Notes (Signed)
Office Visit Note   Patient: Lauren Frye           Date of Birth: Nov 07, 1950           MRN: [id number] Visit Date: 11/11/2016              Requested by: No referring provider defined for this encounter. PCP: Patient, No Pcp Per   Assessment & Plan: Visit Diagnoses:  1. Presence of left artificial knee joint   2. Left knee pain, unspecified chronicity     Plan: At this time we requested that she stops her lower extremity work with the trainer. She is to stop her squats completely. She can use some ibuprofen on a daily basis as an anti-inflammatory.  If she does not do well she can give [place] a call and we can reevaluate her. We do not feel that this is a loosened prosthesis or an infected prosthesis.  Follow-Up Instructions: No Follow-up on file.   Orders:  Orders Placed This Encounter  Procedures  . XR KNEE 3 VIEW LEFT   No orders of the defined types were placed in this encounter.     Procedures: No procedures performed   Clinical Data: No additional findings.   Subjective: Chief Complaint  Patient presents with  . Left Knee - Pain    Lauren Frye is a 66 y o that presents with left knee painx 2 months. Hx of  L TKA in 10/2015. She relates her L knee has been hurting yet she still works out with a Regulatory affairs officer. SHe is having swelling and L  knee "locks" and has almost fallen. Denies injury    Lauren Frye is a 6 y o that presents with left knee pain for approximately 2 months. She is around 13-14 months status post and hand L TKA in 10/2015. She relates her L knee has been hurting and she still works out with a trainer 2-3 times per week. Apparently he is quite aggressive with exercise program and she states that she does deep squats as well as other exercises for her knee.Lauren Frye is having swelling and she states that her knee "locks" and she has almost fallen. Denies any injury.     Review of Systems  Constitutional: Negative.   HENT: Negative.   Respiratory:  Negative.   Cardiovascular: Negative.   Gastrointestinal: Negative.   Genitourinary: Negative.   Skin: Negative.   Neurological: Negative.   Hematological: Negative.   Psychiatric/Behavioral: Negative.      Objective: Vital Signs: BP 123/72   Pulse 88   Ht 5\' 6"  (1.676 m)   Wt 203 lb (92.1 kg)   BMI 32.77 kg/m   Physical Exam  Constitutional: She is oriented to person, place, and time. She appears well-developed and well-nourished.  HENT:  Head: Normocephalic and atraumatic.  Eyes: EOM are normal. Pupils are equal, round, and reactive to light.  Pulmonary/Chest: Effort normal.  Neurological: She is alert and oriented to person, place, and time.  Skin: Skin is warm and dry.  Psychiatric: She has a normal mood and affect. Her behavior is normal. Judgment and thought content normal.    Ortho Exam  0-104 Degrees smooth motion Ligamentous is stable Trace effusion without warmth or erythema Good patellofemoral motion Forward range of motion without mechanical noise  Specialty Comments:  No specialty comments available.  Imaging: Xr Knee 3 View Left  Result Date: 11/11/2016 Three-view knee reveals a left total knee replacement in excellent position alignment. No  radiolucencies are noted.    PMFS History: Patient Active Problem List   Diagnosis Date Noted  . Postoperative anemia due to acute blood loss 10/11/2015  . Arthritis 10/11/2015  . Constipation 10/11/2015  . Headache 10/11/2015  . Osteoarthritis of left knee 10/07/2015  . Obesity 10/07/2015  . 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 Arthritis   . Depression   . Headache     History reviewed. No pertinent family history.  Past Surgical History:  Procedure Laterality Date  . ABDOMINAL HYSTERECTOMY    . ANKLE FUSION    . BOWEL RESECTION    . KNEE SURGERY Bilateral    LEFT ARTHROCOPY  X3    ,  RT X1  . TONSILLECTOMY    . TOTAL KNEE ARTHROPLASTY  Left 10/07/2015  . TOTAL KNEE ARTHROPLASTY Left 10/07/2015   Procedure: TOTAL KNEE ARTHROPLASTY;  Surgeon: Valeria BatmanPeter W Whitfield, MD;  [facility];  Service: Orthopedics;  Laterality: Left;   Social History   Occupational History  . Not on file.   Social History Main Topics  . Smoking status: Never Smoker  . Smokeless tobacco: Never Used  . Alcohol use 0.6 oz/week    1 Glasses of wine per week     Comment: once week  . Drug use: No  . Sexual activity: No

## 2017-04-28 ENCOUNTER — Ambulatory Visit (INDEPENDENT_AMBULATORY_CARE_PROVIDER_SITE_OTHER): Payer: BLUE CROSS/BLUE SHIELD | Admitting: Orthopaedic Surgery

## 2017-04-28 ENCOUNTER — Other Ambulatory Visit (INDEPENDENT_AMBULATORY_CARE_PROVIDER_SITE_OTHER): Payer: Self-pay | Admitting: Orthopedic Surgery

## 2017-04-28 ENCOUNTER — Encounter (INDEPENDENT_AMBULATORY_CARE_PROVIDER_SITE_OTHER): Payer: Self-pay | Admitting: Orthopaedic Surgery

## 2017-04-28 VITALS — BP 122/83 | HR 81 | Resp 16 | Ht 66.0 in | Wt 210.0 lb

## 2017-04-28 DIAGNOSIS — M5440 Lumbago with sciatica, unspecified side: Secondary | ICD-10-CM | POA: Diagnosis not present

## 2017-04-28 DIAGNOSIS — Z96652 Presence of left artificial knee joint: Secondary | ICD-10-CM

## 2017-04-28 MED ORDER — IBUPROFEN 800 MG PO TABS: 800.0000 mg | ORAL_TABLET | Freq: Four times a day (QID) | ORAL | 1 refills | Status: DC | PRN

## 2017-04-28 NOTE — Progress Notes (Signed)
Office Visit Note   Patient: Lauren Frye           Date of Birth: [birth year]           MRN: [id number] Visit Date: 04/28/2017              Requested by: No referring provider defined for this encounter. PCP: Patient, No Pcp Per   Assessment & Plan: Visit Diagnoses:  1. Presence of left artificial knee joint   2. Low back pain with sciatica, sciatica laterality unspecified, unspecified back pain laterality, unspecified chronicity     Plan: Complicated picture with issues referable to her lumbar spine and left knee. I will order an MRI scan of her lumbar spine and a 3-phase bone scan of her left knee to rule out loosening. So we'll order CBC, sedimentation rate and C-reactive protein. Return to the office after the above studies. Not sure yet how the back in the near in a related if at all. I wonder if she may have fibromyalgia given the cutaneous nature of her pain with a little if any significant findings by exam to the left knee.  Follow-Up Instructions: Return after studies.   Orders:  Orders Placed This Encounter  Procedures  . MR Lumbar Spine w/o contrast  . NM Bone Scan 3 Phase Lower Extremity  . Sedimentation rate  . CBC with Differential  . C-reactive protein   No orders of the defined types were placed in this encounter.     Procedures: No procedures performed   Clinical Data: No additional findings.   Subjective: Chief Complaint  Patient presents with  . Knee Pain    Bil knee pain x 10/07/2015, pain has not changed since Left TKR, locking, difficulty walking, difficulty sleeping, swelling, popping, clicking, grinding noises, no injury, not diabetic  . Back Pain    Pain,  DDD, hospitalized September, 2018, difficulty walking  [name] is about a year and a half status post primary left total knee replacement. She's had some issues with that knee ever since surgery. She is been to physical therapy and does perform exercises routinely. She's not had any  fever or chills. X-rays and exam up and basically benign. She still feels some popping and clicking in her knee area and recently she was seen in the emergency room in [place] for acute onset of low back pain that compromised her ability to ambulate. X-rays were apparently consistent with arthritis. She's feeling better with time and medicines but still having some discomfort that radiates from her back to her buttock even to her left thigh. It had any numbness or tingling and denies injury. She denies any chest pain.  HPI  Review of Systems   Objective: Vital Signs: BP 122/83 (BP Location: Right Arm, Patient Position: Sitting, Cuff Size: Normal)   Pulse 81   Resp 16   Ht 5\' 6"  (1.676 m)   Wt 210 lb (95.3 kg)   BMI 33.89 kg/m   Physical Exam  Ortho Exam awake alert and oriented 3. Comfortable sitting. No ambulatory aid. Straight leg raise negative bilaterally painless range of motion both hips mild percussible tenderness of the lumbar spine diffusely. Left knee with multiple areas of palpable obtain he is pain. Her left knee was minimally warmer than the right. Full extension actively and about 95 of flexion. instability with a varus or valgus stress. Negative anterior drawer sign. No effusion is best I can tell. Skin is healed. No popliteal pain. No calf discomfort  neurovascular exam intact. I wonder if she may have fibromyalgia given so m any areas of trigger point tenderness about her left knee.  Specialty Comments:  No specialty comments available.  Imaging: No results found.   PMFS History: Patient Active Problem List   Diagnosis Date Noted  . Postoperative anemia due to acute blood loss 10/11/2015  . Arthritis 10/11/2015  . Constipation 10/11/2015  . Headache 10/11/2015  . Osteoarthritis of left knee 10/07/2015  . Obesity 10/07/2015  . 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 Arthritis   . Depression   .  Headache     History reviewed. No pertinent family history.  Past Surgical History:  Procedure Laterality Date  . ABDOMINAL HYSTERECTOMY    . ANKLE FUSION    . BOWEL RESECTION    . KNEE SURGERY Bilateral    LEFT ARTHROCOPY  X3    ,  RT X1  . TONSILLECTOMY    . TOTAL KNEE ARTHROPLASTY Left 10/07/2015  . TOTAL KNEE ARTHROPLASTY Left 10/07/2015   Procedure: TOTAL KNEE ARTHROPLASTY;  Surgeon: Valeria Batman, MD;  Location: [facility] OR;  Service: Orthopedics;  Laterality: Left;   Social History   Occupational History  . Not on file.   Social History Main Topics  . Smoking status: Never Smoker  . Smokeless tobacco: Never Used  . Alcohol use 0.6 oz/week    1 Glasses of wine per week     Comment: ONCE A MONTH  . Drug use: No  . Sexual activity: No     Valeria Batman, MD   Note - This record has been created using [employer].  Chart creation errors have been sought, but may not always  have been located. Such creation errors do not reflect on  the standard of medical care.

## 2017-04-29 LAB — CBC WITH DIFFERENTIAL/PLATELET
BASOS ABS: 42 {cells}/uL (ref 0–200)
Basophils Relative: 0.5 %
EOS ABS: 241 {cells}/uL (ref 15–500)
EOS PCT: 2.9 %
HEMATOCRIT: 39.1 % (ref 35.0–45.0)
Hemoglobin: 13.4 g/dL (ref 11.7–15.5)
LYMPHS ABS: 2366 {cells}/uL (ref 850–3900)
MCH: 30.2 pg (ref 27.0–33.0)
MCHC: 34.3 g/dL (ref 32.0–36.0)
MCV: 88.3 fL (ref 80.0–100.0)
MPV: 9.2 fL (ref 7.5–12.5)
Monocytes Relative: 9 %
NEUTROS PCT: 59.1 %
Neutro Abs: 4905 cells/uL (ref 1500–7800)
Platelets: 274 10*3/uL (ref 140–400)
RBC: 4.43 10*6/uL (ref 3.80–5.10)
RDW: 12 % (ref 11.0–15.0)
Total Lymphocyte: 28.5 %
WBC mixed population: 747 cells/uL (ref 200–950)
WBC: 8.3 10*3/uL (ref 3.8–10.8)

## 2017-04-29 LAB — SEDIMENTATION RATE: SED RATE: 25 mm/h (ref 0–30)

## 2017-04-29 LAB — C-REACTIVE PROTEIN: CRP: 1.9 mg/L (ref <8.0)

## 2017-05-04 ENCOUNTER — Other Ambulatory Visit (INDEPENDENT_AMBULATORY_CARE_PROVIDER_SITE_OTHER): Payer: Self-pay | Admitting: Orthopaedic Surgery

## 2017-05-04 DIAGNOSIS — M5440 Lumbago with sciatica, unspecified side: Secondary | ICD-10-CM

## 2017-05-16 ENCOUNTER — Encounter (HOSPITAL_COMMUNITY): Payer: BLUE CROSS/BLUE SHIELD

## 2017-05-16 ENCOUNTER — Ambulatory Visit (HOSPITAL_COMMUNITY): Payer: BLUE CROSS/BLUE SHIELD

## 2017-05-20 ENCOUNTER — Ambulatory Visit
Admission: RE | Admit: 2017-05-20 | Discharge: 2017-05-20 | Disposition: A | Discharge status: Home / Self Care | Payer: BLUE CROSS/BLUE SHIELD | Source: Ambulatory Visit | Attending: Orthopaedic Surgery | Admitting: Orthopaedic Surgery

## 2017-05-20 ENCOUNTER — Ambulatory Visit (HOSPITAL_COMMUNITY): Payer: BLUE CROSS/BLUE SHIELD

## 2017-05-20 ENCOUNTER — Encounter (HOSPITAL_COMMUNITY)
Admission: RE | Admit: 2017-05-20 | Discharge: 2017-05-20 | Disposition: A | Discharge status: Home / Self Care | Payer: BLUE CROSS/BLUE SHIELD | Source: Ambulatory Visit | Attending: Orthopaedic Surgery | Admitting: Orthopaedic Surgery

## 2017-05-20 DIAGNOSIS — Z96652 Presence of left artificial knee joint: Secondary | ICD-10-CM | POA: Insufficient documentation

## 2017-05-20 DIAGNOSIS — M5440 Lumbago with sciatica, unspecified side: Secondary | ICD-10-CM

## 2017-05-20 MED ORDER — TECHNETIUM TC 99M MEDRONATE IV KIT
21.6000 | PACK | Freq: Once | INTRAVENOUS | Status: AC | PRN
  Administered 2017-05-20: 21.6 via INTRAVENOUS

## 2017-05-30 ENCOUNTER — Encounter (INDEPENDENT_AMBULATORY_CARE_PROVIDER_SITE_OTHER): Payer: Self-pay | Admitting: Orthopaedic Surgery

## 2017-05-30 ENCOUNTER — Ambulatory Visit (INDEPENDENT_AMBULATORY_CARE_PROVIDER_SITE_OTHER): Payer: BLUE CROSS/BLUE SHIELD | Admitting: Orthopaedic Surgery

## 2017-05-30 DIAGNOSIS — M48061 Spinal stenosis, lumbar region without neurogenic claudication: Secondary | ICD-10-CM

## 2017-05-30 DIAGNOSIS — Z96652 Presence of left artificial knee joint: Secondary | ICD-10-CM

## 2017-05-30 NOTE — Progress Notes (Signed)
Office Visit Note   Patient: Lauren Frye           Date of Birth: [birth year]           MRN: [id number] Visit Date: 05/30/2017              Requested by: No referring provider defined for this encounter. PCP: Patient, No Pcp Per   Assessment & Plan: Visit Diagnoses:  1. Spinal stenosis of lumbar region, unspecified whether neurogenic claudication present   2. History of left knee replacement     Plan: Chronic history of low back pain with possible radiculopathy. Also having some persistent pain about her left total knee replacement. 3 phase bone scan demonstrates a low level uptake in the delayed phase of her left knee in the posterior lateral aspect of the tibial component mildly higher more focal and elsewhere. This is not a specific finding. There are degenerative changes in the right knee.  MRI scan of the lumbar spine demonstrates abnormalities at L3-4, L4-5 and L5-S1 with her areas of spinal and foraminal stenosis. On discussion with Lauren Frye regarding the above. She is having some discomfort along the posterior lateral tibia I think it's worth injecting this after we obtain an epidural steroid injection. There  certainly is a possibility some of her left knee pain is related to her back  Follow-Up Instructions: Return in about 1 month (around 06/29/2017).   Orders:  Orders Placed This Encounter  Procedures  . Ambulatory referral to Physical Medicine Rehab   No orders of the defined types were placed in this encounter.     Procedures: No procedures performed   Clinical Data: No additional findings.   Subjective: Chief Complaint  Patient presents with  . Lower Back - Pain    Lauren Frye is here for MRI results of Lumbar and Bone density scan  Long history of low back pain tickly when she sleeps and 90 when she stands or walks for a length of time. MRI scan as above. Also having some persistent left knee pain after her knee replacement in April 2017. 3-phase  bone scan as mentioned above demonstrates some posterior lateral tibial uptake of unknown etiology. Vascular and blood pool phases are normal  HPI  Review of Systems   Objective: Vital Signs: There were no vitals taken for this visit.  Physical Exam  Ortho Exam awake alert and oriented 3. Comfortable sitting. No percussible back pain. Straight leg raise negative. Painless range of motion both hips. Left knee with some discomfort along the lateral tibia but no effusion. Full quick extension and flexion over 100. No instability. No medial pain or popliteal discomfort no patella pain. No calf discomfort.  Specialty Comments:  No specialty comments available.  Imaging: No results found.   PMFS History: Patient Active Problem List   Diagnosis Date Noted  . Spinal stenosis of lumbar region 05/30/2017  . Postoperative anemia due to acute blood loss 10/11/2015  . Arthritis 10/11/2015  . Constipation 10/11/2015  . Headache 10/11/2015  . Osteoarthritis of left knee 10/07/2015  . Obesity 10/07/2015  . 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 Arthritis   . Depression   . Headache     History reviewed. No pertinent family history.  Past Surgical History:  Procedure Laterality Date  . ABDOMINAL HYSTERECTOMY    . ANKLE FUSION    . BOWEL RESECTION    . KNEE SURGERY Bilateral  LEFT ARTHROCOPY  X3    ,  RT X1  . TONSILLECTOMY    . TOTAL KNEE ARTHROPLASTY Left 10/07/2015  . TOTAL KNEE ARTHROPLASTY Left 10/07/2015   Procedure: TOTAL KNEE ARTHROPLASTY;  Surgeon: Valeria BatmanPeter W Valaree Fresquez, MD;  [facility];  Service: Orthopedics;  Laterality: Left;   Social History   Occupational History  . Not on file  Tobacco Use  . Smoking status: Never Smoker  . Smokeless tobacco: Never Used  Substance and Sexual Activity  . Alcohol use: Yes    Alcohol/week: 0.6 oz    Types: 1 Glasses of wine per week    Comment: ONCE A MONTH  . Drug  use: No  . Sexual activity: No    Birth control/protection: Abstinence     Valeria BatmanPeter W Cairo Lingenfelter, MD   Note - This record has been created using [employer] software.  Chart creation errors have been sought, but may not always  have been located. Such creation errors do not reflect on  the standard of medical care.

## 2017-06-02 ENCOUNTER — Telehealth (INDEPENDENT_AMBULATORY_CARE_PROVIDER_SITE_OTHER): Payer: Self-pay | Admitting: Physical Medicine and Rehabilitation

## 2017-06-02 ENCOUNTER — Other Ambulatory Visit (INDEPENDENT_AMBULATORY_CARE_PROVIDER_SITE_OTHER): Payer: Self-pay | Admitting: Physical Medicine and Rehabilitation

## 2017-06-02 MED ORDER — DIAZEPAM 5 MG PO TABS: ORAL_TABLET | ORAL | 0 refills | Status: DC

## 2017-06-02 NOTE — Telephone Encounter (Signed)
Faxed to patient's pharmacy.  

## 2017-06-02 NOTE — Telephone Encounter (Signed)
printed

## 2017-06-13 ENCOUNTER — Encounter (INDEPENDENT_AMBULATORY_CARE_PROVIDER_SITE_OTHER): Payer: BLUE CROSS/BLUE SHIELD | Admitting: Physical Medicine and Rehabilitation

## 2017-06-20 ENCOUNTER — Encounter (INDEPENDENT_AMBULATORY_CARE_PROVIDER_SITE_OTHER): Payer: Self-pay | Admitting: Physical Medicine and Rehabilitation

## 2017-06-20 ENCOUNTER — Ambulatory Visit (INDEPENDENT_AMBULATORY_CARE_PROVIDER_SITE_OTHER): Payer: Self-pay

## 2017-06-20 ENCOUNTER — Ambulatory Visit (INDEPENDENT_AMBULATORY_CARE_PROVIDER_SITE_OTHER): Payer: BLUE CROSS/BLUE SHIELD | Admitting: Physical Medicine and Rehabilitation

## 2017-06-20 VITALS — BP 121/70 | HR 81 | Temp 98.5°F

## 2017-06-20 DIAGNOSIS — M5442 Lumbago with sciatica, left side: Secondary | ICD-10-CM

## 2017-06-20 DIAGNOSIS — G8929 Other chronic pain: Secondary | ICD-10-CM | POA: Diagnosis not present

## 2017-06-20 DIAGNOSIS — M47816 Spondylosis without myelopathy or radiculopathy, lumbar region: Secondary | ICD-10-CM | POA: Diagnosis not present

## 2017-06-20 MED ORDER — METHYLPREDNISOLONE ACETATE 80 MG/ML IJ SUSP
80.0000 mg | Freq: Once | INTRAMUSCULAR | Status: AC
  Administered 2017-06-20: 80 mg

## 2017-06-20 MED ORDER — LIDOCAINE HCL (PF) 1 % IJ SOLN
2.0000 mL | Freq: Once | INTRAMUSCULAR | Status: AC
  Administered 2017-06-20: 2 mL

## 2017-06-20 NOTE — Patient Instructions (Signed)

## 2017-06-20 NOTE — Progress Notes (Deleted)
Pt states pain and weakness in lower back. -BT, -Dye Allergy, +Driver. Pt is very nervous about injection.

## 2017-07-06 NOTE — Procedures (Signed)
Mrs. Plitt is a 67 year old request of Dr. Cleophas Dunker for epidural injection versus facet joint block.  She complains mainly of what she refers to his pain and weakness in the lower back.  She has not really getting much in the way of radicular complaints down the legs or paresthesias.  According to his notes and her review today she is not had any focal weakness or foot drop.  Has had conservative care without relief including medications and time and therapy.  She has never had a lumbar spine injection and is quite nervous.  She has had no prior lumbar surgery.  Diagnostic facet joint block at L5-S1 bilaterally.  Her MRI is reviewed with the patient and below.  She has moderate facet arthropathy at L5-S1 with moderate foraminal narrowing.  She has no central canal stenosis or focal nerve compression.  Lumbar Facet Joint Intra-Articular Injection(s) with Fluoroscopic Guidance  Patient: Enya Bureau      Date of Birth: [birth year] MRN: [id number] PCP: Patient, No Pcp Per      Visit Date: 06/20/2017   Universal Protocol:    Date/Time: 06/20/2017  Consent Given By: the patient  Position: PRONE   Additional Comments: Vital signs were monitored before and after the procedure. Patient was prepped and draped in the usual sterile fashion. The correct patient, procedure, and site was verified.   Injection Procedure Details:  Procedure Site One Meds Administered:  Meds ordered this encounter  Medications  . lidocaine (PF) (XYLOCAINE) 1 % injection 2 mL  . methylPREDNISolone acetate (DEPO-MEDROL) injection 80 mg     Laterality: Bilateral  Location/Site:  L4-L5  Needle size: 22 guage  Needle type: Spinal  Needle Placement: Articular  Findings:  -Comments: Excellent flow of contrast producing a partial arthrogram.  Procedure Details: The fluoroscope beam is vertically oriented in AP, and the inferior recess is visualized beneath the lower pole of the inferior apophyseal process,  which represents the target point for needle insertion. When direct visualization is difficult the target point is located at the medial projection of the vertebral pedicle. The region overlying each aforementioned target is locally anesthetized with a 1 to 2 ml. volume of 1% Lidocaine without Epinephrine.   The spinal needle was inserted into each of the above mentioned facet joints using biplanar fluoroscopic guidance. A 0.25 to 0.5 ml. volume of Isovue-250 was injected and a partial facet joint arthrogram was obtained. A single spot film was obtained of the resulting arthrogram.    One to 1.25 ml of the steroid/anesthetic solution was then injected into each of the facet joints noted above.   Additional Comments:  The patient tolerated the procedure well Dressing: Band-Aid    Post-procedure details: Patient was observed during the procedure. Post-procedure instructions were reviewed.  Patient left the clinic in stable condition.  Pertinent Imaging: MRI LUMBAR SPINE WITHOUT CONTRAST  TECHNIQUE: Multiplanar, multisequence MR imaging of the lumbar spine was performed. No intravenous contrast was administered.  COMPARISON:  None.  FINDINGS: Segmentation:  Standard.  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  Vertebrae:  No fracture, evidence of discitis, or bone lesion.  Conus medullaris: Extends to the L1 level and appears normal.  Paraspinal and other soft tissues: No paraspinal abnormality.  Disc levels:  Disc spaces: Degenerative disc disease disc height loss and reactive endplate changes at L5-S1.  T12-L1: No significant disc bulge. No evidence of neural foraminal stenosis. No central canal stenosis.  L1-L2: No significant  disc bulge. No evidence of neural foraminal stenosis. No central canal stenosis.  L2-L3: Mild bilateral facet arthropathy. No evidence of neural foraminal stenosis. No central  canal stenosis.  L3-L4: Broad-based disc bulge with a small central disc protrusion. Mild bilateral facet arthropathy. Bilateral lateral recess narrowing. Mild spinal stenosis. No evidence of neural foraminal stenosis.  L4-L5: Broad-based disc bulge. Mild bilateral facet arthropathy. No evidence of neural foraminal stenosis. No central canal stenosis.  L5-S1: Broad-based disc bulge. Moderate bilateral facet arthropathy. Moderate bilateral foraminal stenosis. No central canal stenosis.  IMPRESSION: 1. At L3-4 there is a broad-based disc bulge with a small central disc protrusion. Mild bilateral facet arthropathy. Bilateral lateral recess narrowing. Mild spinal stenosis. 2. At L4-5 there is a broad-based disc bulge. Mild bilateral facet arthropathy. 3. At L5-S1 there is a broad-based disc bulge. Moderate bilateral facet arthropathy. Moderate bilateral foraminal stenosis.   Electronically Signed   By: Elige KoHetal  Patel   On: 05/20/2017 13:05

## 2017-07-12 ENCOUNTER — Telehealth (INDEPENDENT_AMBULATORY_CARE_PROVIDER_SITE_OTHER): Payer: Self-pay | Admitting: Orthopaedic Surgery

## 2017-07-12 NOTE — Telephone Encounter (Signed)
Patient called stating that she received a shot by Dr. Alvester MorinNewton which has not helped.  She would like someone to return her call so she can figure out what to do next.  She was in a lot of pain but did not want to schedule an appointment before talking with Dr. Cleophas DunkerWhitfield first.  CB #  [phone]

## 2017-07-12 NOTE — Telephone Encounter (Signed)
Please advise and I will call.

## 2017-07-13 NOTE — Telephone Encounter (Signed)
called

## 2017-07-18 ENCOUNTER — Encounter (INDEPENDENT_AMBULATORY_CARE_PROVIDER_SITE_OTHER): Payer: Self-pay | Admitting: Orthopaedic Surgery

## 2017-07-18 ENCOUNTER — Ambulatory Visit (INDEPENDENT_AMBULATORY_CARE_PROVIDER_SITE_OTHER): Payer: BLUE CROSS/BLUE SHIELD | Admitting: Orthopaedic Surgery

## 2017-07-18 VITALS — Resp 16 | Ht 66.0 in | Wt 220.0 lb

## 2017-07-18 DIAGNOSIS — M25552 Pain in left hip: Secondary | ICD-10-CM

## 2017-07-18 MED ORDER — METHYLPREDNISOLONE ACETATE 40 MG/ML IJ SUSP
80.0000 mg | INTRAMUSCULAR | Status: AC | PRN
  Administered 2017-07-18: 80 mg

## 2017-07-18 MED ORDER — LIDOCAINE HCL 1 % IJ SOLN
2.0000 mL | INTRAMUSCULAR | Status: AC | PRN
  Administered 2017-07-18: 2 mL

## 2017-07-18 MED ORDER — BUPIVACAINE HCL 0.5 % IJ SOLN
2.0000 mL | INTRAMUSCULAR | Status: AC | PRN
  Administered 2017-07-18: 2 mL via INTRA_ARTICULAR

## 2017-07-18 NOTE — Progress Notes (Signed)
Office Visit Note   Patient: Lauren AguasSusan Frye           Date of Birth: 1951/04/01           MRN: [id number] Visit Date: 07/18/2017              Requested by: No referring provider defined for this encounter. PCP: Patient, No Pcp Per   Assessment & Plan: Visit Diagnoses:  1. Pain of left hip joint     Plan: Symptoms consistent with greater trochanteric bursitis. No relief with lumbar injection by Dr. Alvester MorinNewton. Multiple areas of trigger point tenderness makes me consider possible fibromyalgia. We'll monitor over the next several weeks no related problems with left knee replacement today  Follow-Up Instructions: Return if symptoms worsen or fail to improve.   Orders:  Orders Placed This Encounter  Procedures  . Large Joint Inj: L greater trochanter   No orders of the defined types were placed in this encounter.     Procedures: Large Joint Inj: L greater trochanter on 07/18/2017 4:02 PM Indications: pain and diagnostic evaluation Details: 25 G 1.5 in needle, lateral approach  Arthrogram: No  Medications: 2 mL lidocaine 1 %; 2 mL bupivacaine 0.5 %; 80 mg methylPREDNISolone acetate 40 MG/ML Procedure, treatment alternatives, risks and benefits explained, specific risks discussed. Consent was given by the patient. Immediately prior to procedure a time out was called to verify the correct patient, procedure, equipment, support staff and site/side marked as required. Patient was prepped and draped in the usual sterile fashion.       Clinical Data: No additional findings.   Subjective: Chief Complaint  Patient presents with  . Lower Back - Pain    [name] is a 67 y o here today for recurrent low  Back pain. Dr. Rosina LowensteinNewton injectron 06/21/17 did not help at all. Pt discouraged and still in pain.  [name] recently had an MRI scan of her lumbar spine demonstrating broad-based disc bulge with a small central disc protrusion at L3-4. At L4-5 there was a broad-based disc bulge and  mild bilateral facet arthropathy. At L5-S1 there was a broad-based disc moderate bilateral facet hypertrophy with moderate bilateral foraminal stenosis. Dr. Alvester MorinNewton injected her back with some relief of the pain in her right lower extremity but no effect: Buttock and left lateral hip pain. He is localizing her discomfort in the area of her buttock and lateral hip and is to have a cortisone injection today. No related problems with her left total knee replacement   Review of Systems  Constitutional: Negative for chills, fatigue and fever.  Eyes: Negative for itching.  Respiratory: Negative for chest tightness and shortness of breath.   Cardiovascular: Negative for chest pain, palpitations and leg swelling.  Gastrointestinal: Negative for blood in stool, constipation and diarrhea.  Endocrine: Negative for polyuria.  Genitourinary: Negative for dysuria.  Musculoskeletal: Positive for back pain. Negative for joint swelling, neck pain and neck stiffness.  Allergic/Immunologic: Negative for immunocompromised state.  Neurological: Positive for headaches. Negative for dizziness and numbness.  Hematological: Does not bruise/bleed easily.  Psychiatric/Behavioral: Positive for sleep disturbance. The patient is not nervous/anxious.      Objective: Vital Signs: Resp 16   Ht 5\' 6"  (1.676 m)   Wt 220 lb (99.8 kg)   BMI 35.51 kg/m   Physical Exam  Ortho Exammultiple areas of trigger point tenderness about the left buttock and lateral aspect of left hip. Greatest area of tenderness was over the greater trochanter.  Skin induration or redness. No percussible tenderness of lumbar spine. Does have arthritis in her right knee. Has had a left total knee replacement which is presently asymptomatic but has been slow in resolving postop discomfort   Specialty Comments:  No specialty comments available.  Imaging: No results found.   PMFS History: Patient Active Problem List   Diagnosis Date Noted  .  Spinal stenosis of lumbar region 05/30/2017  . Postoperative anemia due to acute blood loss 10/11/2015  . Arthritis 10/11/2015  . Constipation 10/11/2015  . Headache 10/11/2015  . Osteoarthritis of left knee 10/07/2015  . Obesity 10/07/2015  . 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 Arthritis   . Depression   . Headache     History reviewed. No pertinent family history.  Past Surgical History:  Procedure Laterality Date  . ABDOMINAL HYSTERECTOMY    . ANKLE FUSION    . BOWEL RESECTION    . KNEE SURGERY Bilateral    LEFT ARTHROCOPY  X3    ,  RT X1  . TONSILLECTOMY    . TOTAL KNEE ARTHROPLASTY Left 10/07/2015  . TOTAL KNEE ARTHROPLASTY Left 10/07/2015   Procedure: TOTAL KNEE ARTHROPLASTY;  Surgeon: Valeria Batman, MD;  Location: [facility] OR;  Service: Orthopedics;  Laterality: Left;   Social History   Occupational History  . Not on file  Tobacco Use  . Smoking status: Never Smoker  . Smokeless tobacco: Never Used  Substance and Sexual Activity  . Alcohol use: Yes    Alcohol/week: 0.6 oz    Types: 1 Glasses of wine per week    Comment: ONCE A MONTH  . Drug use: No  . Sexual activity: No    Birth control/protection: Abstinence

## 2017-09-13 ENCOUNTER — Ambulatory Visit (INDEPENDENT_AMBULATORY_CARE_PROVIDER_SITE_OTHER): Payer: BLUE CROSS/BLUE SHIELD | Admitting: Orthopaedic Surgery

## 2017-09-13 ENCOUNTER — Telehealth (INDEPENDENT_AMBULATORY_CARE_PROVIDER_SITE_OTHER): Payer: Self-pay | Admitting: Radiology

## 2017-09-13 ENCOUNTER — Encounter (INDEPENDENT_AMBULATORY_CARE_PROVIDER_SITE_OTHER): Payer: Self-pay | Admitting: Orthopaedic Surgery

## 2017-09-13 VITALS — Ht 66.0 in | Wt 220.0 lb

## 2017-09-13 DIAGNOSIS — Z96652 Presence of left artificial knee joint: Secondary | ICD-10-CM | POA: Diagnosis not present

## 2017-09-13 DIAGNOSIS — G8929 Other chronic pain: Secondary | ICD-10-CM

## 2017-09-13 DIAGNOSIS — M545 Low back pain: Secondary | ICD-10-CM | POA: Diagnosis not present

## 2017-09-13 NOTE — Progress Notes (Signed)
Office Visit Note   Patient: Lauren Frye           Date of Birth: 04-Apr-1951           MRN: [id number] Visit Date: 09/13/2017              Requested by: No referring provider defined for this encounter. PCP: Patient, No Pcp Per   Assessment & Plan: Visit Diagnoses:  1. History of left knee replacement   2. Chronic bilateral low back pain without sciatica     Plan: Chronic discomfort left total knee replacement.  To date bone scan was not diagnostic.  CBC sed rate and C-reactive protein were all within normal limits.  There is no evidence of clinical instability.  I have a long talk with her lasting well over 30 minutes regarding her present problem and I think that it is worth devoting 3-6 months of strenuous physical therapy to work on quad strengthening, might also suggest having 1 of my associates evaluate to see if they have any thoughts. I do  Not think the prosthesis is loose.  I do not think it is infected.  I do not think she has any instability.  She does have limited flexion but I am not sure that that is  Significant.. After much discussion she is going to get involved with an exercise program and check back with me over the next 2-3 months.  A lot of her issues are related to her weight, lack of exercise, chronic back pain possibly fibromyalgia and thigh weakness. Also discussed the chronic problem with her back.  She has had an MRI scan with significant degenerative changes.  She has seen Dr. Alvester Frye injection was effective.  I suggested she recontact Dr. Lajoyce Frye to see if he has any other suggestions.  She also seems to have discomfort out of proportion of what I would expect and always concerned she may have fibromyalgia  Follow-Up Instructions: Return if symptoms worsen or fail to improve.   Orders:  No orders of the defined types were placed in this encounter.  No orders of the defined types were placed in this encounter.     Procedures: No procedures  performed   Clinical Data: No additional findings.   Subjective: Chief Complaint  Patient presents with  . Left Knee - Pain, Routine Post Op    Lauren Frye 66 YO F HERE TODAY WITH L KNEE PAIN, HAD SURGERY 2 YEARS AGO, IT LOCKS UP. NO INJECTIONS. KNEE IS GETTING TIGHTER.  Lauren Frye is about 2 years status post primary left total knee replacement has had some trouble with the knee basis.  I have evaluated her knee with a sense with lab work where there was abnormal including CBC sed rate C-reactive protein.  A bone scan was not not consistent with either loosening or infection.  Think the biggest problem is she is with a weakness and in her thigh musculature.  Occasionally she has some difficulty when she goes up or down stairs feels "tight".  She is really not having that much pain.  Problem with her back that seems to be more of a nuisance than her knee..  She has seen Dr. Alvester Frye for an injection that was not helpful.  Not having any numbness or tingling.  She also was having some pain over the greater trochanter of her left hip that I injected and that problem has resolved.  She does not appear to have any radiculopathy  HPI  Review  of Systems  Constitutional: Negative for fatigue and fever.  HENT: Negative for ear pain.   Eyes: Negative for pain.  Respiratory: Negative for cough and shortness of breath.   Cardiovascular: Positive for leg swelling.  Gastrointestinal: Negative for blood in stool, constipation and diarrhea.  Genitourinary: Negative for dysuria.  Musculoskeletal: Positive for back pain. Negative for neck pain.  Skin: Negative for rash and wound.  Allergic/Immunologic: Negative for food allergies.  Neurological: Positive for weakness and numbness. Negative for dizziness, light-headedness and headaches.  Hematological: Bruises/bleeds easily.  Psychiatric/Behavioral: Positive for sleep disturbance.     Objective: Vital Signs: Ht 5\' 6"  (1.676 m)   Wt 220 lb (99.8 kg)   BMI  35.51 kg/m   Physical Exam  Ortho Exam awake alert and oriented x3.  Comfortable sitting.  Straight leg raise is negative painless range of motion of both hips.  She did not have any tenderness over the greater trochanter of her left hip.  She had lots of areas of tenderness about her.  Her knee was not hot red warm or swollen.  There was no increased varus or valgus or anterior drawer sign.  There is no effusion.  No patella pain.  Is no localized area of tenderness particularly along the posterior lateral corner of her knee or  Medially.  Swelling distally.  Neurovascular exam intact.  Has gained 16-20 pounds since September.  Full extension left knee and about 98 degrees of flexion.  Specialty Comments:  No specialty comments available.  Imaging: No results found.   PMFS History: Patient Active Problem List   Diagnosis Date Noted  . Spinal stenosis of lumbar region 05/30/2017  . Postoperative anemia due to acute blood loss 10/11/2015  . Arthritis 10/11/2015  . Constipation 10/11/2015  . Headache 10/11/2015  . Osteoarthritis of left knee 10/07/2015  . Obesity 10/07/2015  . 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 Arthritis   . Depression   . Headache     Family History  Problem Relation Age of Onset  . Parkinson's disease Mother   . COPD Mother   . Diabetes Sister     Past Surgical History:  Procedure Laterality Date  . ABDOMINAL HYSTERECTOMY    . ANKLE FUSION    . BOWEL RESECTION    . KNEE SURGERY Bilateral    LEFT ARTHROCOPY  X3    ,  RT X1  . TONSILLECTOMY    . TOTAL KNEE ARTHROPLASTY Left 10/07/2015  . TOTAL KNEE ARTHROPLASTY Left 10/07/2015   Procedure: TOTAL KNEE ARTHROPLASTY;  Surgeon: Valeria BatmanPeter W Whitfield, MD;  [facility];  Service: Orthopedics;  Laterality: Left;   Social History   Occupational History  . Not on file  Tobacco Use  . Smoking status: Never Smoker  . Smokeless tobacco: Never Used   Substance and Sexual Activity  . Alcohol use: Yes    Alcohol/week: 0.6 oz    Types: 1 Glasses of wine per week    Comment: ONCE A MONTH  . Drug use: No  . Sexual activity: No    Birth control/protection: Abstinence

## 2017-09-14 NOTE — Telephone Encounter (Signed)
ERROR

## 2017-09-22 ENCOUNTER — Telehealth (INDEPENDENT_AMBULATORY_CARE_PROVIDER_SITE_OTHER): Payer: Self-pay | Admitting: Radiology

## 2017-09-22 NOTE — Telephone Encounter (Signed)
-----   Message from Lauren Frye, [place] sent at 09/19/2017 11:19 AM EDT ----- Contact: [phone] I called the patient, and she said that Dr. Cleophas DunkerWhitfield was supposed to be putting in a new referral for her because the last injections "did not work." I do not see a new referral or any mention of one in Dr. Hoy RegisterWhitfield's last note. Please advise.

## 2017-10-10 ENCOUNTER — Encounter (INDEPENDENT_AMBULATORY_CARE_PROVIDER_SITE_OTHER): Payer: Self-pay | Admitting: Orthopaedic Surgery

## 2017-10-10 ENCOUNTER — Ambulatory Visit (INDEPENDENT_AMBULATORY_CARE_PROVIDER_SITE_OTHER): Payer: BLUE CROSS/BLUE SHIELD | Admitting: Orthopaedic Surgery

## 2017-10-10 VITALS — BP 114/81 | HR 86 | Resp 16 | Ht 66.0 in | Wt 222.0 lb

## 2017-10-10 DIAGNOSIS — M25562 Pain in left knee: Secondary | ICD-10-CM

## 2017-10-10 NOTE — Progress Notes (Signed)
Office Visit Note   Patient: Lauren AguasSusan Frye           Date of Birth: 10-31-1950           MRN: [id number] Visit Date: 10/10/2017              Requested by: No referring provider defined for this encounter. PCP: Patient, No Pcp Per   Assessment & Plan: Visit Diagnoses:  1. Acute pain of left knee     Plan: Larey SeatFell directly on left total knee replacement last Thursday.  Has small hematoma but no apparent injury to the knee.  We will plan to see back as needed.  I do not think that she has had a significant injury to the knee replacement or any of the structures about the knee  Follow-Up Instructions: Return if symptoms worsen or fail to improve.   Orders:  No orders of the defined types were placed in this encounter.  No orders of the defined types were placed in this encounter.     Procedures: No procedures performed   Clinical Data: No additional findings.   Subjective: Chief Complaint  Patient presents with  . Left Knee - Pain, Injury, Follow-up, Routine Post Op  . Follow-up    DELL ON LEFT KNEE THURSDAY, WANTS TO MAKE SURE KNEE REPL IS OK AND IF NEED ANTBIOTICS FOR FACIAL BRUSING  Fell directly on left total knee replacement last Thursday with some mild pain.  Using any ambulatory aid.  Came to the office to be sure there was no problem.  Fever chills  HPI  Review of Systems  Constitutional: Negative for fatigue.  HENT: Negative for ear pain.   Eyes: Positive for pain.  Respiratory: Negative for cough and shortness of breath.   Cardiovascular: Negative for leg swelling.  Gastrointestinal: Negative for constipation and diarrhea.  Genitourinary: Negative for difficulty urinating.  Musculoskeletal: Negative for back pain and neck pain.  Skin: Negative for rash.  Allergic/Immunologic: Negative for food allergies.  Neurological: Positive for weakness and headaches. Negative for light-headedness and numbness.  Hematological: Bruises/bleeds easily.    Psychiatric/Behavioral: Positive for sleep disturbance.     Objective: Vital Signs: BP 114/81 (BP Location: Left Arm, Patient Position: Sitting, Cuff Size: Normal)   Pulse 86   Resp 16   Ht 5\' 6"  (1.676 m)   Wt 222 lb (100.7 kg)   BMI 35.83 kg/m   Physical Exam  Ortho Exam awake alert and oriented x3.  Comfortable sitting.  Left knee was not hot warm or red.  There is an area of ecchymosis about 3 x 3 cm medial to the patella tendon and distal to the patella.  Full quick extension.  Minimal effusion.  Flexed over 100 degrees.  Walks without a limp.  No swelling distally.  Neurovascular exam intact.  Not using any ambulatory aid.  Specialty Comments:  No specialty comments available.  Imaging: No results found.   PMFS History: Patient Active Problem List   Diagnosis Date Noted  . Spinal stenosis of lumbar region 05/30/2017  . Postoperative anemia due to acute blood loss 10/11/2015  . Arthritis 10/11/2015  . Constipation 10/11/2015  . Headache 10/11/2015  . Osteoarthritis of left knee 10/07/2015  . Obesity 10/07/2015  . 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 Arthritis   . Depression   . Headache     Family History  Problem Relation Age of Onset  . Parkinson's disease  Mother   . COPD Mother   . Diabetes Sister     Past Surgical History:  Procedure Laterality Date  . ABDOMINAL HYSTERECTOMY    . ANKLE FUSION    . BOWEL RESECTION    . KNEE SURGERY Bilateral    LEFT ARTHROCOPY  X3    ,  RT X1  . TONSILLECTOMY    . TOTAL KNEE ARTHROPLASTY Left 10/07/2015  . TOTAL KNEE ARTHROPLASTY Left 10/07/2015   Procedure: TOTAL KNEE ARTHROPLASTY;  Surgeon: Valeria Batman, MD;  Location: [facility] OR;  Service: Orthopedics;  Laterality: Left;   Social History   Occupational History  . Not on file  Tobacco Use  . Smoking status: Never Smoker  . Smokeless tobacco: Never Used  Substance and Sexual Activity  . Alcohol use:  Yes    Alcohol/week: 0.6 oz    Types: 1 Glasses of wine per week    Comment: ONCE A MONTH  . Drug use: No  . Sexual activity: Never    Birth control/protection: Abstinence

## 2017-11-21 ENCOUNTER — Ambulatory Visit (INDEPENDENT_AMBULATORY_CARE_PROVIDER_SITE_OTHER): Payer: BLUE CROSS/BLUE SHIELD | Admitting: Orthopaedic Surgery

## 2017-11-21 ENCOUNTER — Encounter (INDEPENDENT_AMBULATORY_CARE_PROVIDER_SITE_OTHER): Payer: Self-pay | Admitting: Orthopaedic Surgery

## 2017-11-21 ENCOUNTER — Ambulatory Visit (INDEPENDENT_AMBULATORY_CARE_PROVIDER_SITE_OTHER): Payer: BLUE CROSS/BLUE SHIELD

## 2017-11-21 VITALS — BP 126/73 | HR 102 | Resp 16 | Ht 66.0 in | Wt 220.0 lb

## 2017-11-21 DIAGNOSIS — M25561 Pain in right knee: Secondary | ICD-10-CM

## 2017-11-21 DIAGNOSIS — G8929 Other chronic pain: Secondary | ICD-10-CM

## 2017-11-21 DIAGNOSIS — M1711 Unilateral primary osteoarthritis, right knee: Secondary | ICD-10-CM

## 2017-11-21 NOTE — Progress Notes (Signed)
Office Visit Note   Patient: Lauren Frye           Date of Birth: 1951/05/09           MRN: [id number] Visit Date: 11/21/2017              Requested by: No referring provider defined for this encounter. PCP: Patient, No Pcp Per   Assessment & Plan: Visit Diagnoses:  1. Chronic pain of right knee   2. Unilateral primary osteoarthritis, right knee     Plan: Long discussion regarding the osteoarthritis of her right knee.  She like to proceed with a knee replacement of June or end of July.  She is now 2 years status post primary left total knee replacement and is aware of the procedure.  Will need a clearance form  Follow-Up Instructions: No follow-ups on file.   Orders:  Orders Placed This Encounter  Procedures  . XR KNEE 3 VIEW RIGHT   No orders of the defined types were placed in this encounter.     Procedures: No procedures performed   Clinical Data: No additional findings.   Subjective: Chief Complaint  Patient presents with  . Right Knee - Pain  . Knee Pain    Right knee pain x years , popping, clicking, grinding noises, arthroscopcic Dr. Cleophas Dunker, swelling, giving out, difficulty walking, difficulty sleeping at night, Aleve helps somes, not diabetic  Lauren Frye has had progressive problems with her right knee.  She has had multiple occasions where she had a sensation of her knee giving way as well as popping and clicking.  She is at the point where it is really compromising her daily activities.  She has had a prior left total knee replacement but still is somewhat bothersome.  Work-up to date for that knee was dense of loosening or infection.  She has been going to the gym 3 times a week for strengthening which I am sure will make a difference.  She was seen a month ago after a fall directly on the anterior aspect of her left knee.  That has resolved.  No fever, no chills  HPI  Review of Systems  Constitutional: Positive for activity change.  HENT: Negative  for trouble swallowing.   Eyes: Negative for pain.  Respiratory: Negative for shortness of breath.   Cardiovascular: Positive for leg swelling.  Gastrointestinal: Negative for constipation.  Endocrine: Negative for cold intolerance.  Genitourinary: Negative for difficulty urinating.  Musculoskeletal: Positive for back pain, gait problem and joint swelling.  Skin: Negative for rash.  Allergic/Immunologic: Negative for food allergies.  Neurological: Positive for weakness and numbness.  Hematological: Does not bruise/bleed easily.  Psychiatric/Behavioral: Positive for sleep disturbance.     Objective: Vital Signs: BP 126/73 (BP Location: Right Arm, Patient Position: Sitting, Cuff Size: Normal)   Pulse (!) 102   Resp 16   Ht  (1.676 m)   Wt 220 lb (99.8 kg)   BMI 35.51 kg/m   Physical Exam  Constitutional: She is oriented to person, place, and time. She appears well-developed and well-nourished.  HENT:  Mouth/Throat: Oropharynx is clear and moist.  Eyes: Pupils are equal, round, and reactive to light. EOM are normal.  Pulmonary/Chest: Effort normal.  Neurological: She is alert and oriented to person, place, and time.  Skin: Skin is warm and dry.  Psychiatric: She has a normal mood and affect. Her behavior is normal.    Ortho Exam awake alert and oriented x3.  Comfortable  sitting.  Left knee exam without evidence of instability.  No effusion.  No increased heat.  Full extension.  No distal edema.  No ecchymosis.  Right knee with small effusion.  Predominantly lateral joint pain with flexion extension and with a sensation of catching beneath the patella.  Positive patellar crepitation.  No instability with varus valgus stress.  Slight loss of knee extension.  Flexed about 100 degrees.  No calf pain.  No swelling distally.  Neurovascular exam intact.  Flexion to 125 degrees.  Specialty Comments:  No specialty comments available.  Imaging: No results found.   PMFS  History: Patient Active Problem List   Diagnosis Date Noted  . Spinal stenosis of lumbar region 05/30/2017  . Postoperative anemia due to acute blood loss 10/11/2015  . Arthritis 10/11/2015  . Constipation 10/11/2015  . Headache 10/11/2015  . Osteoarthritis of left knee 10/07/2015  . Obesity 10/07/2015  . 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 Arthritis   . Depression   . Headache     Family History  Problem Relation Age of Onset  . Parkinson's disease Mother   . COPD Mother   . Diabetes Sister     Past Surgical History:  Procedure Laterality Date  . ABDOMINAL HYSTERECTOMY    . ANKLE FUSION    . BOWEL RESECTION    . KNEE ARTHROSCOPY    . KNEE SURGERY Bilateral    LEFT ARTHROCOPY  X3    ,  RT X1  . TONSILLECTOMY    . TOTAL KNEE ARTHROPLASTY Left 10/07/2015  . TOTAL KNEE ARTHROPLASTY Left 10/07/2015   Procedure: TOTAL KNEE ARTHROPLASTY;  Surgeon: Valeria Batman, MD;  Location: [facility] OR;  Service: Orthopedics;  Laterality: Left;   Social History   Occupational History  . Not on file  Tobacco Use  . Smoking status: Never Smoker  . Smokeless tobacco: Never Used  Substance and Sexual Activity  . Alcohol use: Yes    Alcohol/week: 0.6 oz    Types: 1 Glasses of wine per week    Comment: ONCE A MONTH  . Drug use: No  . Sexual activity: Never    Birth control/protection: Abstinence

## 2017-11-22 ENCOUNTER — Telehealth (INDEPENDENT_AMBULATORY_CARE_PROVIDER_SITE_OTHER): Payer: Self-pay | Admitting: Orthopaedic Surgery

## 2017-11-22 ENCOUNTER — Encounter (INDEPENDENT_AMBULATORY_CARE_PROVIDER_SITE_OTHER): Payer: Self-pay | Admitting: Radiology

## 2017-11-22 NOTE — Telephone Encounter (Signed)
May I write letter for pt.? Please advise, thank you

## 2017-11-22 NOTE — Telephone Encounter (Signed)
Notified pt letter was ready for pick up at front

## 2017-11-22 NOTE — Telephone Encounter (Signed)
Ok to write-let me edit when finished

## 2017-11-22 NOTE — Telephone Encounter (Signed)
Patient is requesting a letter from Dr. Cleophas Dunker stating that she is unable to fly because of the necessity for Right Knee Replacement.  She had purchased the tickets back in February, but because of the fall she sustained is unable to fly to see her daughter in [place] (the trip would be lengthy having to fly from [place] to [place] and then on to [place])  Please notify patient when ready.  She may be able to pick up or she will request you emailing the letter.    Call if any questions  [phone]

## 2017-12-13 ENCOUNTER — Other Ambulatory Visit (INDEPENDENT_AMBULATORY_CARE_PROVIDER_SITE_OTHER): Payer: Self-pay | Admitting: Orthopedic Surgery

## 2017-12-13 ENCOUNTER — Telehealth (INDEPENDENT_AMBULATORY_CARE_PROVIDER_SITE_OTHER): Payer: Self-pay | Admitting: Orthopaedic Surgery

## 2017-12-13 NOTE — Telephone Encounter (Signed)
Per Arlys JohnBrian Clinical notes need to be sent to insurance company for Skilled Nursing precert. Fax notes to [phone]. Please include reference # on cover sheet [id number].

## 2017-12-13 NOTE — Telephone Encounter (Signed)
Urgent. Please advise. Thank you!

## 2017-12-13 NOTE — Telephone Encounter (Signed)
Patient calling in reference to orders for skilled nursing after surgery (RIGHT TOTAL KNEE ARTHROPLASTY) at [facility].  ([place] In order for the insurance [facility]([employer]) to pay it for skilled nursing will require an order from you. This order will need to go in ASAP, because it can take up to 3 weeks to process.  Patient states that she has been through this before with other knee, so she knows that insurance will pay, but does require an order from the doctor   Please fax order  to [facility] @  [phone] and put to the attention of Nikki.  If you have any questions regarding this request,  please call patient or call Lowella Bandyikki w/admissions  [phone]) [phone]    Patient's cb#   [phone]

## 2017-12-15 NOTE — Telephone Encounter (Signed)
Spoke with Arlys JohnBrian gave him NPI #[id number] for [facility] so he could complete orders.

## 2017-12-16 NOTE — Telephone Encounter (Signed)
[facility] we only have to fax orders for SNF to her and she handles the rest (precerting).

## 2017-12-20 ENCOUNTER — Telehealth (INDEPENDENT_AMBULATORY_CARE_PROVIDER_SITE_OTHER): Payer: Self-pay | Admitting: Orthopaedic Surgery

## 2017-12-20 NOTE — Telephone Encounter (Signed)
Dr. Cleophas DunkerWhitfield said that would be fine. Thank you.

## 2017-12-20 NOTE — Telephone Encounter (Signed)
Please advise 

## 2017-12-20 NOTE — Telephone Encounter (Signed)
Patient having increased pain with her lt hip, and would like to know if she can get an injection in her hip since she is scheduled for TKR on July 2nd. Please call to advise.

## 2017-12-20 NOTE — Telephone Encounter (Signed)
I spoke with patient, and scheduled an appt for Friday 6/21 for a hip injection.

## 2017-12-22 ENCOUNTER — Encounter (INDEPENDENT_AMBULATORY_CARE_PROVIDER_SITE_OTHER): Payer: Self-pay | Admitting: Surgery

## 2017-12-22 ENCOUNTER — Ambulatory Visit (INDEPENDENT_AMBULATORY_CARE_PROVIDER_SITE_OTHER): Payer: BLUE CROSS/BLUE SHIELD | Admitting: Surgery

## 2017-12-22 VITALS — BP 124/74 | HR 100 | Ht 66.0 in | Wt 220.0 lb

## 2017-12-22 DIAGNOSIS — G8929 Other chronic pain: Secondary | ICD-10-CM

## 2017-12-22 DIAGNOSIS — M25561 Pain in right knee: Principal | ICD-10-CM

## 2017-12-22 NOTE — Pre-Procedure Instructions (Signed)
Lauren Frye  12/22/2017      [facility] [id number] - [place] - [address] [facility] [address] [place] [id number] Phone: [phone] Fax: [phone]    Your procedure is scheduled on July 2  [facility] at 5:30 A.M.  Call this number if you have problems the morning of surgery:  [phone]   Remember:   Do not eat or drink after midnight.                      Take these medicines the morning of surgery with A SIP OF WATER :  None               7 days prior to surgery STOP taking any Aspirin(unless otherwise instructed by your surgeon), Aleve, Naproxen, Ibuprofen, Motrin, Advil, Goody's, BC's, all herbal medications, fish oil, and all vitamins     Do not wear jewelry, make-up or nail polish.  Do not wear lotions, powders, or perfumes, or deodorant.  Do not shave 48 hours prior to surgery.  Men may shave face and neck.  Do not bring valuables to the hospital.  [facility] is not responsible for any belongings or valuables.  Contacts, dentures or bridgework may not be worn into surgery.  Leave your suitcase in the car.  After surgery it may be brought to your room.  For patients admitted to the hospital, discharge time will be determined by your treatment team.  Patients discharged the day of surgery will not be allowed to drive home.    Special instructions:  [facility]- Preparing For Surgery  Before surgery, you can play an important role. Because skin is not sterile, your skin needs to be as free of germs as possible. You can reduce the number of germs on your skin by washing with CHG (chlorahexidine gluconate) Soap before surgery.  CHG is an antiseptic cleaner which kills germs and bonds with the skin to continue killing germs even after washing.    Oral Hygiene is also important to reduce your risk of infection.  Remember - BRUSH YOUR TEETH THE MORNING OF SURGERY WITH YOUR REGULAR  TOOTHPASTE  Please do not use if you have an allergy to CHG or antibacterial soaps. If your skin becomes reddened/irritated stop using the CHG.  Do not shave (including legs and underarms) for at least 48 hours prior to first CHG shower. It is OK to shave your face.  Please follow these instructions carefully.   1. Shower the NIGHT BEFORE SURGERY and the MORNING OF SURGERY with CHG.   2. If you chose to wash your hair, wash your hair first as usual with your normal shampoo.  3. After you shampoo, rinse your hair and body thoroughly to remove the shampoo.  4. Use CHG as you would any other liquid soap. You can apply CHG directly to the skin and wash gently with a scrungie or a clean washcloth.   5. Apply the CHG Soap to your body ONLY FROM THE NECK DOWN.  Do not use on open wounds or open sores. Avoid contact with your eyes, ears, mouth and genitals (private parts). Wash Face and genitals (private parts)  with your normal soap.  6. Wash thoroughly, paying special attention to the area where your surgery will be performed.  7. Thoroughly rinse your body with warm water from the neck down.  8. DO NOT  shower/wash with your normal soap after using and rinsing off the CHG Soap.  9. Pat yourself dry with a CLEAN TOWEL.  10. Wear CLEAN PAJAMAS to bed the night before surgery, wear comfortable clothes the morning of surgery  11. Place CLEAN SHEETS on your bed the night of your first shower and DO NOT SLEEP WITH PETS.    Day of Surgery:  Do not apply any deodorants/lotions.  Please wear clean clothes to the hospital/surgery center.   Remember to brush your teeth WITH YOUR REGULAR TOOTHPASTE.    Please read over the following fact sheets that you were given. Coughing and Deep Breathing, Total Joint Packet, MRSA Information and Surgical Site Infection Prevention

## 2017-12-22 NOTE — H&P (Signed)
TOTAL KNEE ADMISSION H&P  Patient is being admitted for right total knee arthroplasty.  Subjective:  Chief Complaint:right knee pain.  HPI: Lauren Frye, 67 y.o. female, has a history of pain and functional disability in the right knee due to arthritis and has failed non-surgical conservative treatments for greater than 12 weeks to includeNSAID's and/or analgesics, corticosteriod injections and activity modification.  Onset of symptoms was gradual, starting 10 years ago with gradually worsening course since that time. Patient currently rates pain in the right knee(s) at 10 out of 10 with activity. Patient has night pain, worsening of pain with activity and weight bearing, pain that interferes with activities of daily living, pain with passive range of motion, crepitus and joint swelling.  Patient has evidence of subchondral sclerosis, periarticular osteophytes and joint space narrowing by imaging studies.  There is no active infection.  Patient Active Problem List   Diagnosis Date Noted  . Spinal stenosis of lumbar region 05/30/2017  . Postoperative anemia due to acute blood loss 10/11/2015  . Arthritis 10/11/2015  . Constipation 10/11/2015  . Headache 10/11/2015  . Osteoarthritis of left knee 10/07/2015  . Obesity 10/07/2015  . 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 Arthritis   . Depression   . Headache     Past Surgical History:  Procedure Laterality Date  . ABDOMINAL HYSTERECTOMY    . ANKLE FUSION    . BOWEL RESECTION    . KNEE ARTHROSCOPY    . KNEE SURGERY Bilateral    LEFT ARTHROCOPY  X3    ,  RT X1  . TONSILLECTOMY    . TOTAL KNEE ARTHROPLASTY Left 10/07/2015  . TOTAL KNEE ARTHROPLASTY Left 10/07/2015   Procedure: TOTAL KNEE ARTHROPLASTY;  Surgeon: Valeria BatmanPeter W Whitfield, MD;  [facility];  Service: Orthopedics;  Laterality: Left;    No current facility-administered medications for this encounter.    Current  Outpatient Medications  Medication Sig Dispense Refill Last Dose  . BIOTIN PO Take 1 tablet by mouth daily.     Blossom Hoops. Carbonyl Iron (PERFECT IRON) 25 MG TABS Take 25 mg by mouth daily. SOFT IRON     . ibuprofen (ADVIL,MOTRIN) 800 MG tablet Take 1 tablet (800 mg total) by mouth every 6 (six) hours as needed. (Patient taking differently: Take 800 mg by mouth every 6 (six) hours as needed (for pain.). ) 120 tablet 1 Taking  . Multiple Vitamins-Minerals (STRESS TAB NF PO) Take 1 tablet by mouth daily.     . naproxen sodium (ALEVE) 220 MG tablet Take 440 mg by mouth 2 (two) times daily as needed (for pain.).    Taking  . Probiotic Product (PROBIOTIC ADVANCED PO) Take 3 capsules by mouth daily.      Allergies  Allergen Reactions  . Codeine Anaphylaxis, Hives, Itching and Swelling    Possible swelling in throat  . Penicillins Anaphylaxis    Has patient had a PCN reaction causing immediate rash, facial/tongue/throat swelling, SOB or lightheadedness with hypotension: Unknown Has patient had a PCN reaction causing severe rash involving mucus membranes or skin necrosis: Unknown Has patient had a PCN reaction that required hospitalization: Unknown--treated in ER Has patient had a PCN reaction occurring within the last 10 years: No CHILDHOOD REACTION. If all of the above answers are "NO", then may proceed with Cephalosporin use.   Marland Kitchen. Propoxyphene Hives, Itching and Swelling    POSSIBLE swelling in throat  . Prednisone Swelling  Undefined as to specifics    Social History   Tobacco Use  . Smoking status: Never Smoker  . Smokeless tobacco: Never Used  Substance Use Topics  . Alcohol use: Yes    Alcohol/week: 0.6 oz    Types: 1 Glasses of wine per week    Comment: ONCE A MONTH    Family History  Problem Relation Age of Onset  . Parkinson's disease Mother   . COPD Mother   . Diabetes Sister      Review of Systems  Constitutional: Negative.   HENT: Negative.   Respiratory: Negative.    Cardiovascular: Negative.   Gastrointestinal: Negative.   Genitourinary: Negative.   Skin: Negative.     Objective:  Physical Exam  Vital signs in last 24 hours: Pulse Rate:  [100] 100 (06/20 0852) BP: (124)/(74) 124/74 (06/20 0852) Weight:  [220 lb (99.8 kg)] 220 lb (99.8 kg) (06/20 0852)  Labs:   Estimated body mass index is 35.51 kg/m as calculated from the following:   Height as of 12/22/17: 5\' 6"  (1.676 m).   Weight as of 12/22/17: 220 lb (99.8 kg).   Imaging Review Plain radiographs demonstrate moderate degenerative joint disease of the right knee(s). The overall alignment isneutral. The bone quality appears to be good for age and reported activity level.   Preoperative templating of the joint replacement has been completed, documented, and submitted to the Operating Room personnel in order to optimize intra-operative equipment management.    Assessment/Plan:  End stage arthritis, right knee   The patient history, physical examination, clinical judgment of the provider and imaging studies are consistent with end stage degenerative joint disease of the right knee(s) and total knee arthroplasty is deemed medically necessary. The treatment options including medical management, injection therapy arthroscopy and arthroplasty were discussed at length. The risks and benefits of total knee arthroplasty were presented and reviewed. The risks due to aseptic loosening, infection, stiffness, patella tracking problems, thromboembolic complications and other imponderables were discussed. The patient acknowledged the explanation, agreed to proceed with the plan and consent was signed. Patient is being admitted for inpatient treatment for surgery, pain control, PT, OT, prophylactic antibiotics, VTE prophylaxis, progressive ambulation and ADL's and discharge planning. The patient is planning to be discharged to skilled nursing facility

## 2017-12-22 NOTE — Progress Notes (Signed)
67 year old white female history of end-stage DJD right knee comes in for preop evaluation.  She is a patient of Dr. Norlene CampbellPeter Whitfield.  Today full H&P performed and placed in patient's chart.  Patient states that she is not established with a primary care physician.

## 2017-12-23 ENCOUNTER — Ambulatory Visit (HOSPITAL_COMMUNITY)
Admission: RE | Admit: 2017-12-23 | Discharge: 2017-12-23 | Disposition: A | Discharge status: Home / Self Care | Payer: BLUE CROSS/BLUE SHIELD | Source: Ambulatory Visit | Attending: Surgery | Admitting: Surgery

## 2017-12-23 ENCOUNTER — Ambulatory Visit (INDEPENDENT_AMBULATORY_CARE_PROVIDER_SITE_OTHER): Payer: BLUE CROSS/BLUE SHIELD | Admitting: Orthopaedic Surgery

## 2017-12-23 ENCOUNTER — Encounter (HOSPITAL_COMMUNITY)
Admission: RE | Admit: 2017-12-23 | Discharge: 2017-12-23 | Disposition: A | Discharge status: Home / Self Care | Payer: BLUE CROSS/BLUE SHIELD | Source: Ambulatory Visit | Attending: Orthopaedic Surgery | Admitting: Orthopaedic Surgery

## 2017-12-23 ENCOUNTER — Encounter (HOSPITAL_COMMUNITY): Payer: Self-pay

## 2017-12-23 ENCOUNTER — Encounter (INDEPENDENT_AMBULATORY_CARE_PROVIDER_SITE_OTHER): Payer: Self-pay | Admitting: Orthopaedic Surgery

## 2017-12-23 ENCOUNTER — Other Ambulatory Visit (INDEPENDENT_AMBULATORY_CARE_PROVIDER_SITE_OTHER): Payer: Self-pay | Admitting: Radiology

## 2017-12-23 ENCOUNTER — Other Ambulatory Visit: Payer: Self-pay

## 2017-12-23 VITALS — BP 103/77 | HR 80 | Ht 66.0 in | Wt 220.0 lb

## 2017-12-23 DIAGNOSIS — M7062 Trochanteric bursitis, left hip: Secondary | ICD-10-CM

## 2017-12-23 DIAGNOSIS — Z01812 Encounter for preprocedural laboratory examination: Secondary | ICD-10-CM | POA: Insufficient documentation

## 2017-12-23 DIAGNOSIS — R918 Other nonspecific abnormal finding of lung field: Secondary | ICD-10-CM | POA: Diagnosis not present

## 2017-12-23 DIAGNOSIS — M1711 Unilateral primary osteoarthritis, right knee: Secondary | ICD-10-CM | POA: Diagnosis not present

## 2017-12-23 DIAGNOSIS — Z01818 Encounter for other preprocedural examination: Secondary | ICD-10-CM | POA: Diagnosis not present

## 2017-12-23 DIAGNOSIS — Z01810 Encounter for preprocedural cardiovascular examination: Secondary | ICD-10-CM | POA: Diagnosis not present

## 2017-12-23 LAB — URINALYSIS, ROUTINE W REFLEX MICROSCOPIC
BILIRUBIN URINE: NEGATIVE
GLUCOSE, UA: NEGATIVE mg/dL
Hgb urine dipstick: NEGATIVE
Ketones, ur: NEGATIVE mg/dL
Leukocytes, UA: NEGATIVE
NITRITE: NEGATIVE
PH: 5 (ref 5.0–8.0)
Protein, ur: NEGATIVE mg/dL
SPECIFIC GRAVITY, URINE: 1.017 (ref 1.005–1.030)

## 2017-12-23 LAB — COMPREHENSIVE METABOLIC PANEL
ALK PHOS: 74 U/L (ref 38–126)
ALT: 21 U/L (ref 14–54)
AST: 21 U/L (ref 15–41)
Albumin: 3.9 g/dL (ref 3.5–5.0)
Anion gap: 11 (ref 5–15)
BILIRUBIN TOTAL: 0.5 mg/dL (ref 0.3–1.2)
BUN: 16 mg/dL (ref 6–20)
CO2: 24 mmol/L (ref 22–32)
CREATININE: 0.88 mg/dL (ref 0.44–1.00)
Calcium: 9.5 mg/dL (ref 8.9–10.3)
Chloride: 107 mmol/L (ref 101–111)
GFR calc Af Amer: >60 mL/min (ref >60)
GFR calc non Af Amer: >60 mL/min (ref >60)
GLUCOSE: 54 mg/dL — AB (ref 65–99)
POTASSIUM: 4.2 mmol/L (ref 3.5–5.1)
Sodium: 142 mmol/L (ref 135–145)
TOTAL PROTEIN: 6.4 g/dL — AB (ref 6.5–8.1)

## 2017-12-23 LAB — CBC
HEMATOCRIT: 41.7 % (ref 36.0–46.0)
HEMOGLOBIN: 13.4 g/dL (ref 12.0–15.0)
MCH: 30 pg (ref 26.0–34.0)
MCHC: 32.1 g/dL (ref 30.0–36.0)
MCV: 93.3 fL (ref 78.0–100.0)
Platelets: 265 10*3/uL (ref 150–400)
RBC: 4.47 MIL/uL (ref 3.87–5.11)
RDW: 12.7 % (ref 11.5–15.5)
WBC: 7.5 10*3/uL (ref 4.0–10.5)

## 2017-12-23 LAB — TYPE AND SCREEN
ABO/RH(D): O POS
Antibody Screen: NEGATIVE

## 2017-12-23 LAB — PROTIME-INR
INR: 0.99
PROTHROMBIN TIME: 13 s (ref 11.4–15.2)

## 2017-12-23 LAB — APTT: APTT: 32 s (ref 24–36)

## 2017-12-23 LAB — SURGICAL PCR SCREEN
MRSA, PCR: NEGATIVE
STAPHYLOCOCCUS AUREUS: NEGATIVE

## 2017-12-23 MED ORDER — LIDOCAINE HCL 1 % IJ SOLN
2.0000 mL | INTRAMUSCULAR | Status: AC | PRN
  Administered 2017-12-23: 2 mL

## 2017-12-23 MED ORDER — BUPIVACAINE HCL 0.5 % IJ SOLN
2.0000 mL | INTRAMUSCULAR | Status: AC | PRN
  Administered 2017-12-23: 2 mL via INTRA_ARTICULAR

## 2017-12-23 MED ORDER — METHYLPREDNISOLONE ACETATE 40 MG/ML IJ SUSP
80.0000 mg | INTRAMUSCULAR | Status: AC | PRN
  Administered 2017-12-23: 80 mg

## 2017-12-23 MED ORDER — METHOCARBAMOL 500 MG PO TABS: ORAL_TABLET | ORAL | 0 refills | Status: DC

## 2017-12-23 NOTE — Progress Notes (Signed)
PCP -  denies Cardiologist - denies, pt reports having stress test and ECHO done 5[place]10 yr [place] d/t her "heart racing" pt states this was found to be d/t caffeine intake. Pt reports tests were normal and no other workup needed  Chest x-ray - 12/23/2017  EKG - 12/23/2017   Patient denies shortness of breath, fever, cough and chest pain at PAT appointment   Patient verbalized understanding of instructions that were given to them at the PAT appointment. Patient was also instructed that they will need to review over the PAT instructions again at home before surgery.

## 2017-12-23 NOTE — Progress Notes (Signed)
Office Visit Note   Patient: Lauren Frye           Date of Birth: 1951-02-20           MRN: [id number] Visit Date: 12/23/2017              Requested by: No referring provider defined for this encounter. PCP: Patient, No Pcp Per   Assessment & Plan: Visit Diagnoses:  1. Greater trochanteric bursitis of left hip     Plan: Greater trochanteric bursitis left hip-recurrent.  Will inject with cortisone  Follow-Up Instructions: Return if symptoms worsen or fail to improve.   Orders:  Orders Placed This Encounter  Procedures  . Large Joint Inj: L greater trochanter   No orders of the defined types were placed in this encounter.     Procedures: Large Joint Inj: L greater trochanter on 12/23/2017 9:53 AM Indications: pain and diagnostic evaluation Details: 25 G 1.5 in needle, lateral approach  Arthrogram: No  Medications: 2 mL lidocaine 1 %; 2 mL bupivacaine 0.5 %; 80 mg methylPREDNISolone acetate 40 MG/ML Procedure, treatment alternatives, risks and benefits explained, specific risks discussed. Consent was given by the patient. Immediately prior to procedure a time out was called to verify the correct patient, procedure, equipment, support staff and site/side marked as required. Patient was prepped and draped in the usual sterile fashion.       Clinical Data: No additional findings.   Subjective: Chief Complaint  Patient presents with  . Left Hip - Pain  . Follow-up    L HIP INJECTION LAST INJECTION WAS 08/2017  Lauren Frye is scheduled to have replacement on 2 July.  She is having some recurrent pain over the greater trochanter left hip.  This is been previously injected with good results.  Recent injury or trauma.  HPI  Review of Systems  Constitutional: Negative for fatigue and fever.  HENT: Negative for ear pain.   Eyes: Negative for pain.  Respiratory: Negative for cough and shortness of breath.   Cardiovascular: Positive for leg swelling.    Gastrointestinal: Negative for constipation and diarrhea.  Genitourinary: Negative for difficulty urinating.  Musculoskeletal: Positive for back pain. Negative for neck pain.  Skin: Negative for rash.  Allergic/Immunologic: Negative for food allergies.  Neurological: Negative for weakness and numbness.  Hematological: Bruises/bleeds easily.  Psychiatric/Behavioral: Positive for sleep disturbance.     Objective: Vital Signs: BP 103/77 (BP Location: Left Arm, Patient Position: Sitting, Cuff Size: Normal)   Pulse 80   Ht 5\' 6"  (1.676 m)   Wt 220 lb (99.8 kg)   BMI 35.51 kg/m   Physical Exam  Ortho Exam awake alert and oriented x3.  Comfortable sitting.  Walks with a slight limp referable to the left hip pain.  Has had a previous left total knee replacement.  Has local tenderness directly over the greater trochanter of the left hip.  Neurovascular exam intact Specialty Comments:  No specialty comments available.  Imaging: Dg Chest 2 View  Result Date: 12/23/2017 CLINICAL DATA:  Patient admitted for right knee replacement. EXAM: CHEST - 2 VIEW COMPARISON:  09/24/2015. FINDINGS: Mediastinum hilar structures are normal. Questionable nodular opacity in the right lung base. This may represent a small area of atelectasis. Repeat PA and lateral chest x-ray suggested. If this nodular density remains nonenhanced chest CT can be obtained. No acute cardiopulmonary disease otherwise noted. No pleural effusion pneumothorax. Degenerative change thoracic spine. IMPRESSION: 1. Nodular density noted projected over the right lung  base. This may represent a small area of atelectasis. Repeat PA lateral chest x-ray suggested. This nodular density remains nonenhanced chest CT can be obtained. 2.  No acute cardiopulmonary disease otherwise noted. Electronically Signed   By: Maisie Fus  Register   On: 12/23/2017 08:51     PMFS History: Patient Active Problem List   Diagnosis Date Noted  . Spinal stenosis of  lumbar region 05/30/2017  . Postoperative anemia due to acute blood loss 10/11/2015  . Arthritis 10/11/2015  . Constipation 10/11/2015  . Headache 10/11/2015  . Osteoarthritis of left knee 10/07/2015  . Obesity 10/07/2015  . 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 Arthritis   . Depression    denies  . Headache    migraines    Family History  Problem Relation Age of Onset  . Parkinson's disease Mother   . COPD Mother   . Diabetes Sister     Past Surgical History:  Procedure Laterality Date  . ABDOMINAL HYSTERECTOMY    . ANKLE FUSION    . BOWEL RESECTION    . KNEE ARTHROSCOPY    . KNEE SURGERY Bilateral    LEFT ARTHROCOPY  X3    ,  RT X1  . TONSILLECTOMY    . TOTAL KNEE ARTHROPLASTY Left 10/07/2015  . TOTAL KNEE ARTHROPLASTY Left 10/07/2015   Procedure: TOTAL KNEE ARTHROPLASTY;  Surgeon: Valeria Batman, MD;  Location: [facility] OR;  Service: Orthopedics;  Laterality: Left;   Social History   Occupational History  . Not on file  Tobacco Use  . Smoking status: Never Smoker  . Smokeless tobacco: Never Used  Substance and Sexual Activity  . Alcohol use: Yes    Alcohol/week: 0.6 oz    Types: 1 Glasses of wine per week    Comment: ONCE A week  . Drug use: No  . Sexual activity: Never    Birth control/protection: Abstinence

## 2017-12-24 LAB — URINE CULTURE

## 2017-12-26 ENCOUNTER — Other Ambulatory Visit (INDEPENDENT_AMBULATORY_CARE_PROVIDER_SITE_OTHER): Payer: Self-pay | Admitting: Orthopedic Surgery

## 2017-12-26 ENCOUNTER — Telehealth (INDEPENDENT_AMBULATORY_CARE_PROVIDER_SITE_OTHER): Payer: Self-pay | Admitting: Orthopaedic Surgery

## 2017-12-26 DIAGNOSIS — J9811 Atelectasis: Secondary | ICD-10-CM

## 2017-12-26 NOTE — Telephone Encounter (Signed)
[facility] regarding patient's chest xray done on 12-23-17.  Patient will need repeat chest xray according to the report.   Lauren CodeBrian Petrarca has already read the report and placed an order so the patient can have done prior to her date of surgery.  Please contact this patient to let her know another chest xray is necessary.

## 2017-12-26 NOTE — Progress Notes (Signed)
Anesthesia Chart Review:   Case:  [id number] Date/Time:  01/03/18 0700   Procedure:  RIGHT TOTAL KNEE ARTHROPLASTY (Right )   Anesthesia type:  Choice   Pre-op diagnosis:  RIGHT KNEE OSTEOARTHRITIS   [facility] / [facility]   Surgeon:  Valeria BatmanWhitfield, Peter W, MD      [id number]: -  Pt is a 67 year old female   VS: BP 132/80   Pulse 78   Temp 36.9 C   Resp 20   Ht 5\' 6"  (1.676 m)   Wt 226 lb 6.4 oz (102.7 kg)   SpO2 96%   BMI 36.54 kg/m    PROVIDERS: Patient, No Pcp Per   LABS: Labs reviewed: Acceptable for surgery. (all labs ordered are listed, but only abnormal results are displayed)  Labs Reviewed  URINE CULTURE - Abnormal; Notable for the following components:      Result Value   Culture   (*)    Value: <10,000 COLONIES/mL INSIGNIFICANT GROWTH Performed at [facility], [address] [place], [place] [id number]    All other components within normal limits  COMPREHENSIVE METABOLIC PANEL - Abnormal; Notable for the following components:   Glucose, Bld 54 (*)    Total Protein 6.4 (*)    All other components within normal limits  URINALYSIS, ROUTINE W REFLEX MICROSCOPIC - Abnormal; Notable for the following components:   APPearance HAZY (*)    All other components within normal limits  SURGICAL PCR SCREEN  APTT  CBC  PROTIME-INR  TYPE AND SCREEN     IMAGES:  CXR 12/23/17:  1. Nodular density noted projected over the right lung base. This may represent a small area of atelectasis. Repeat PA lateral chest x-ray suggested. This nodular density remains nonenhanced chest CT can be obtained. 2.  No acute cardiopulmonary disease otherwise noted. - I notified Debbie in Dr. Hoy RegisterWhitfield's office of CXR results.    EKG 12/23/17: Sinus rhythm with occasional PVCs. Otherwise normal ECG (ABR)    Past Medical History:  Diagnosis Date  . Arthritis   . Depression    denies  . Headache    migraines    Past Surgical History:  Procedure Laterality Date  .  ABDOMINAL HYSTERECTOMY    . ANKLE FUSION    . BOWEL RESECTION    . KNEE ARTHROSCOPY    . KNEE SURGERY Bilateral    LEFT ARTHROCOPY  X3    ,  RT X1  . TONSILLECTOMY    . TOTAL KNEE ARTHROPLASTY Left 10/07/2015  . TOTAL KNEE ARTHROPLASTY Left 10/07/2015   Procedure: TOTAL KNEE ARTHROPLASTY;  Surgeon: Valeria BatmanPeter W Whitfield, MD;  [facility];  Service: Orthopedics;  Laterality: Left;    MEDICATIONS: . BIOTIN PO  . Carbonyl Iron (PERFECT IRON) 25 MG TABS  . ibuprofen (ADVIL,MOTRIN) 800 MG tablet  . methocarbamol (ROBAXIN) 500 MG tablet  . Multiple Vitamins-Minerals (STRESS TAB NF PO)  . naproxen sodium (ALEVE) 220 MG tablet  . Probiotic Product (PROBIOTIC ADVANCED PO)   No current facility-administered medications for this encounter.     If no changes, I anticipate pt can proceed with surgery as scheduled.   Rica Mastngela Mercede Rollo, FNP-BC [facility]/Anesthesiology Phone: [phone] 12/26/2017 4:12 PM

## 2017-12-27 ENCOUNTER — Telehealth (INDEPENDENT_AMBULATORY_CARE_PROVIDER_SITE_OTHER): Payer: Self-pay | Admitting: Orthopaedic Surgery

## 2017-12-27 ENCOUNTER — Ambulatory Visit (HOSPITAL_COMMUNITY)
Admission: RE | Admit: 2017-12-27 | Discharge: 2017-12-27 | Disposition: A | Discharge status: Home / Self Care | Payer: BLUE CROSS/BLUE SHIELD | Source: Ambulatory Visit | Attending: Orthopedic Surgery | Admitting: Orthopedic Surgery

## 2017-12-27 DIAGNOSIS — Z0389 Encounter for observation for other suspected diseases and conditions ruled out: Secondary | ICD-10-CM | POA: Diagnosis present

## 2017-12-27 DIAGNOSIS — Z01818 Encounter for other preprocedural examination: Secondary | ICD-10-CM | POA: Diagnosis not present

## 2017-12-27 DIAGNOSIS — Z1383 Encounter for screening for respiratory disorder NEC: Secondary | ICD-10-CM | POA: Diagnosis not present

## 2017-12-27 DIAGNOSIS — J9811 Atelectasis: Secondary | ICD-10-CM

## 2017-12-27 NOTE — Telephone Encounter (Signed)
Notified pt that we needed to repeat cxr and order was put in. Pt stated she would go have it done today 12/27/17

## 2017-12-27 NOTE — Telephone Encounter (Signed)
Notified pt of xray report of no nodules.

## 2017-12-27 NOTE — Telephone Encounter (Signed)
Patient called stating she had her chest x-ray this morning.  Patient is requesting a return call to confirm that she is still able to have her surgery on 01/03/18.

## 2017-12-29 ENCOUNTER — Other Ambulatory Visit (INDEPENDENT_AMBULATORY_CARE_PROVIDER_SITE_OTHER): Payer: Self-pay

## 2018-01-02 MED ORDER — TRANEXAMIC ACID 1000 MG/10ML IV SOLN
2000.0000 mg | INTRAVENOUS | Status: AC
  Administered 2018-01-03: 2000 mg via TOPICAL
  Filled 2018-01-02: qty 20

## 2018-01-02 MED ORDER — ACETAMINOPHEN 10 MG/ML IV SOLN
1000.0000 mg | Freq: Once | INTRAVENOUS | Status: AC
  Administered 2018-01-03: 1000 mg via INTRAVENOUS
  Filled 2018-01-02: qty 100

## 2018-01-02 MED ORDER — SODIUM CHLORIDE 0.9 % IV SOLN
1500.0000 mg | INTRAVENOUS | Status: AC
  Administered 2018-01-03: 1500 mg via INTRAVENOUS
  Filled 2018-01-02: qty 1500

## 2018-01-02 NOTE — Anesthesia Preprocedure Evaluation (Addendum)
Anesthesia Evaluation  Patient identified by MRN, date of birth, ID band Patient awake    Reviewed: Allergy & Precautions, NPO status , Patient's Chart, lab work & pertinent test results  Airway Mallampati: II  TM Distance: >3 FB Neck ROM: Full    Dental  (+) Teeth Intact   Pulmonary neg pulmonary ROS,    breath sounds clear to auscultation       Cardiovascular Exercise Tolerance: Good negative cardio ROS   Rhythm:Regular Rate:Normal     Neuro/Psych  Headaches, PSYCHIATRIC DISORDERS Depression    GI/Hepatic negative GI ROS, Neg liver ROS,   Endo/Other  negative endocrine ROSMorbid obesity  Renal/GU negative Renal ROS  negative genitourinary   Musculoskeletal  (+) Arthritis ,   Abdominal (+) + obese,   Peds negative pediatric ROS (+)  Hematology  (+) anemia ,   Anesthesia Other Findings   Reproductive/Obstetrics negative OB ROS                             Lab Results  Component Value Date   WBC 7.5 12/23/2017   HGB 13.4 12/23/2017   HCT 41.7 12/23/2017   MCV 93.3 12/23/2017   PLT 265 12/23/2017    Anesthesia Physical Anesthesia Plan  ASA: II  Anesthesia Plan: Spinal and Regional   Post-op Pain Management:    Induction:   PONV Risk Score and Plan: Treatment may vary due to age or medical condition  Airway Management Planned: Mask and Simple Face Mask  Additional Equipment:   Intra-op Plan:   Post-operative Plan:   Informed Consent: I have reviewed the patients History and Physical, chart, labs and discussed the procedure including the risks, benefits and alternatives for the proposed anesthesia with the patient or authorized representative who has indicated his/her understanding and acceptance.     Plan Discussed with: CRNA  Anesthesia Plan Comments:         Anesthesia Quick Evaluation

## 2018-01-03 ENCOUNTER — Inpatient Hospital Stay (HOSPITAL_COMMUNITY): Payer: BLUE CROSS/BLUE SHIELD | Admitting: Certified Registered"

## 2018-01-03 ENCOUNTER — Observation Stay (HOSPITAL_COMMUNITY)
Admission: RE | Admit: 2018-01-03 | Discharge: 2018-01-06 | Disposition: A | Discharge status: Skilled Nursing Facility | Payer: BLUE CROSS/BLUE SHIELD | Source: Ambulatory Visit | Attending: Orthopaedic Surgery | Admitting: Orthopaedic Surgery

## 2018-01-03 ENCOUNTER — Other Ambulatory Visit: Payer: Self-pay

## 2018-01-03 ENCOUNTER — Inpatient Hospital Stay (HOSPITAL_COMMUNITY): Payer: BLUE CROSS/BLUE SHIELD | Admitting: Emergency Medicine

## 2018-01-03 ENCOUNTER — Encounter (HOSPITAL_COMMUNITY)
Admission: RE | Disposition: A | Discharge status: Skilled Nursing Facility | Payer: Self-pay | Source: Ambulatory Visit | Attending: Orthopaedic Surgery

## 2018-01-03 ENCOUNTER — Encounter (HOSPITAL_COMMUNITY): Payer: Self-pay

## 2018-01-03 ENCOUNTER — Inpatient Hospital Stay (HOSPITAL_COMMUNITY): Payer: BLUE CROSS/BLUE SHIELD

## 2018-01-03 DIAGNOSIS — Z880 Allergy status to penicillin: Secondary | ICD-10-CM | POA: Diagnosis not present

## 2018-01-03 DIAGNOSIS — M1711 Unilateral primary osteoarthritis, right knee: Secondary | ICD-10-CM | POA: Diagnosis not present

## 2018-01-03 DIAGNOSIS — Z885 Allergy status to narcotic agent status: Secondary | ICD-10-CM | POA: Insufficient documentation

## 2018-01-03 DIAGNOSIS — Z884 Allergy status to anesthetic agent status: Secondary | ICD-10-CM | POA: Insufficient documentation

## 2018-01-03 DIAGNOSIS — Z96652 Presence of left artificial knee joint: Secondary | ICD-10-CM | POA: Insufficient documentation

## 2018-01-03 DIAGNOSIS — Z96659 Presence of unspecified artificial knee joint: Secondary | ICD-10-CM

## 2018-01-03 DIAGNOSIS — Z79899 Other long term (current) drug therapy: Secondary | ICD-10-CM | POA: Diagnosis not present

## 2018-01-03 HISTORY — PX: TOTAL KNEE ARTHROPLASTY: SHX125

## 2018-01-03 SURGERY — ARTHROPLASTY, KNEE, TOTAL
Anesthesia: Regional | Site: Knee | Laterality: Right

## 2018-01-03 MED ORDER — METOCLOPRAMIDE HCL 5 MG PO TABS: 5.0000 mg | ORAL_TABLET | Freq: Three times a day (TID) | ORAL | Status: DC | PRN

## 2018-01-03 MED ORDER — FENTANYL CITRATE (PF) 250 MCG/5ML IJ SOLN
INTRAMUSCULAR | Status: DC | PRN
  Administered 2018-01-03: 50 ug via INTRAVENOUS
  Administered 2018-01-03 (×4): 25 ug via INTRAVENOUS
  Administered 2018-01-03: 50 ug via INTRAVENOUS

## 2018-01-03 MED ORDER — MEPERIDINE HCL 50 MG/ML IJ SOLN: 6.2500 mg | INTRAMUSCULAR | Status: DC | PRN

## 2018-01-03 MED ORDER — ACETAMINOPHEN 10 MG/ML IV SOLN: 1000.0000 mg | Freq: Once | INTRAVENOUS | Status: DC | PRN

## 2018-01-03 MED ORDER — CARBONYL IRON 25 MG PO TABS: 25.0000 mg | ORAL_TABLET | Freq: Every day | ORAL | Status: DC

## 2018-01-03 MED ORDER — HYDROMORPHONE HCL 1 MG/ML IJ SOLN
INTRAMUSCULAR | Status: AC
  Filled 2018-01-03: qty 1

## 2018-01-03 MED ORDER — ONDANSETRON HCL 4 MG PO TABS
4.0000 mg | ORAL_TABLET | Freq: Four times a day (QID) | ORAL | Status: DC | PRN
  Administered 2018-01-03: 4 mg via ORAL
  Filled 2018-01-03: qty 1

## 2018-01-03 MED ORDER — MAGNESIUM CITRATE PO SOLN: 1.0000 | Freq: Once | ORAL | Status: DC | PRN

## 2018-01-03 MED ORDER — 0.9 % SODIUM CHLORIDE (POUR BTL) OPTIME
TOPICAL | Status: DC | PRN
  Administered 2018-01-03: 1000 mL

## 2018-01-03 MED ORDER — SODIUM CHLORIDE 0.9 % IR SOLN
Status: DC | PRN
  Administered 2018-01-03: 3000 mL

## 2018-01-03 MED ORDER — POLYETHYLENE GLYCOL 3350 17 G PO PACK
17.0000 g | PACK | Freq: Every day | ORAL | Status: DC | PRN
  Administered 2018-01-05: 17 g via ORAL
  Filled 2018-01-03: qty 1

## 2018-01-03 MED ORDER — HYDROCODONE-ACETAMINOPHEN 7.5-325 MG PO TABS
1.0000 | ORAL_TABLET | Freq: Once | ORAL | Status: AC | PRN
  Administered 2018-01-03: 1 via ORAL

## 2018-01-03 MED ORDER — PANTOPRAZOLE SODIUM 40 MG PO TBEC
40.0000 mg | DELAYED_RELEASE_TABLET | Freq: Every day | ORAL | Status: DC
  Administered 2018-01-04 – 2018-01-06 (×3): 40 mg via ORAL
  Filled 2018-01-03 (×3): qty 1

## 2018-01-03 MED ORDER — ONDANSETRON HCL 4 MG/2ML IJ SOLN: 4.0000 mg | Freq: Four times a day (QID) | INTRAMUSCULAR | Status: DC | PRN

## 2018-01-03 MED ORDER — METHOCARBAMOL 500 MG PO TABS
ORAL_TABLET | ORAL | Status: AC
  Filled 2018-01-03: qty 1

## 2018-01-03 MED ORDER — LIDOCAINE 2% (20 MG/ML) 5 ML SYRINGE
INTRAMUSCULAR | Status: DC | PRN
  Administered 2018-01-03: 20 mg via INTRAVENOUS

## 2018-01-03 MED ORDER — CHLORHEXIDINE GLUCONATE 4 % EX LIQD: 60.0000 mL | Freq: Once | CUTANEOUS | Status: DC

## 2018-01-03 MED ORDER — FENTANYL CITRATE (PF) 250 MCG/5ML IJ SOLN
INTRAMUSCULAR | Status: AC
  Filled 2018-01-03: qty 5

## 2018-01-03 MED ORDER — CLINDAMYCIN PHOSPHATE 600 MG/50ML IV SOLN
600.0000 mg | Freq: Four times a day (QID) | INTRAVENOUS | Status: AC
  Administered 2018-01-03 (×2): 600 mg via INTRAVENOUS
  Filled 2018-01-03 (×2): qty 50

## 2018-01-03 MED ORDER — METHOCARBAMOL 500 MG PO TABS
500.0000 mg | ORAL_TABLET | Freq: Four times a day (QID) | ORAL | Status: DC | PRN
  Administered 2018-01-03 – 2018-01-06 (×7): 500 mg via ORAL
  Filled 2018-01-03 (×7): qty 1

## 2018-01-03 MED ORDER — HYDROMORPHONE HCL 2 MG PO TABS: ORAL_TABLET | ORAL | 0 refills | Status: DC

## 2018-01-03 MED ORDER — ASPIRIN 81 MG PO CHEW
81.0000 mg | CHEWABLE_TABLET | Freq: Two times a day (BID) | ORAL | Status: DC
  Administered 2018-01-03 – 2018-01-06 (×6): 81 mg via ORAL
  Filled 2018-01-03 (×6): qty 1

## 2018-01-03 MED ORDER — ALUM & MAG HYDROXIDE-SIMETH 200-200-20 MG/5ML PO SUSP: 30.0000 mL | ORAL | Status: DC | PRN

## 2018-01-03 MED ORDER — ZOLPIDEM TARTRATE 5 MG PO TABS: 5.0000 mg | ORAL_TABLET | Freq: Every evening | ORAL | Status: DC | PRN

## 2018-01-03 MED ORDER — PROPOFOL 500 MG/50ML IV EMUL
INTRAVENOUS | Status: DC | PRN
  Administered 2018-01-03: 60 ug/kg/min via INTRAVENOUS

## 2018-01-03 MED ORDER — HYDROCODONE-ACETAMINOPHEN 7.5-325 MG PO TABS
ORAL_TABLET | ORAL | Status: AC
  Filled 2018-01-03: qty 1

## 2018-01-03 MED ORDER — MENTHOL 3 MG MT LOZG: 1.0000 | LOZENGE | OROMUCOSAL | Status: DC | PRN

## 2018-01-03 MED ORDER — HYDROMORPHONE HCL 2 MG PO TABS
2.0000 mg | ORAL_TABLET | ORAL | Status: DC | PRN
  Administered 2018-01-05 – 2018-01-06 (×5): 2 mg via ORAL
  Filled 2018-01-03 (×6): qty 1

## 2018-01-03 MED ORDER — ACETAMINOPHEN 325 MG PO TABS
325.0000 mg | ORAL_TABLET | Freq: Four times a day (QID) | ORAL | Status: DC | PRN
  Administered 2018-01-05: 650 mg via ORAL
  Filled 2018-01-03: qty 2

## 2018-01-03 MED ORDER — BUPIVACAINE-EPINEPHRINE (PF) 0.25% -1:200000 IJ SOLN
INTRAMUSCULAR | Status: AC
  Filled 2018-01-03: qty 30

## 2018-01-03 MED ORDER — HYDROMORPHONE HCL 1 MG/ML IJ SOLN
0.5000 mg | INTRAMUSCULAR | Status: DC | PRN
  Administered 2018-01-03 – 2018-01-05 (×7): 1 mg via INTRAVENOUS
  Filled 2018-01-03 (×7): qty 1

## 2018-01-03 MED ORDER — METHOCARBAMOL 1000 MG/10ML IJ SOLN
500.0000 mg | Freq: Four times a day (QID) | INTRAVENOUS | Status: DC | PRN
  Filled 2018-01-03: qty 5

## 2018-01-03 MED ORDER — ACETAMINOPHEN 500 MG PO TABS
1000.0000 mg | ORAL_TABLET | Freq: Four times a day (QID) | ORAL | Status: AC
  Administered 2018-01-03 – 2018-01-04 (×4): 1000 mg via ORAL
  Filled 2018-01-03 (×4): qty 2

## 2018-01-03 MED ORDER — BISACODYL 5 MG PO TBEC
5.0000 mg | DELAYED_RELEASE_TABLET | Freq: Every day | ORAL | Status: DC | PRN
  Administered 2018-01-06: 5 mg via ORAL
  Filled 2018-01-03: qty 1

## 2018-01-03 MED ORDER — MIDAZOLAM HCL 5 MG/5ML IJ SOLN
INTRAMUSCULAR | Status: DC | PRN
Start: 2018-01-03 — End: 2018-01-03
  Administered 2018-01-03: 2 mg via INTRAVENOUS

## 2018-01-03 MED ORDER — MIDAZOLAM HCL 2 MG/2ML IJ SOLN
INTRAMUSCULAR | Status: AC
  Filled 2018-01-03: qty 2

## 2018-01-03 MED ORDER — POTASSIUM CHLORIDE IN NACL 20-0.9 MEQ/L-% IV SOLN
INTRAVENOUS | Status: DC
Start: 2018-01-03 — End: 2018-01-04
  Administered 2018-01-03: 16:00:00 via INTRAVENOUS
  Filled 2018-01-03: qty 1000

## 2018-01-03 MED ORDER — PHENOL 1.4 % MT LIQD: 1.0000 | OROMUCOSAL | Status: DC | PRN

## 2018-01-03 MED ORDER — ASPIRIN EC 81 MG PO TBEC: DELAYED_RELEASE_TABLET | ORAL | 0 refills | Status: DC

## 2018-01-03 MED ORDER — GLYCOPYRROLATE 0.2 MG/ML IJ SOLN
INTRAMUSCULAR | Status: DC | PRN
  Administered 2018-01-03: 0.2 mg via INTRAVENOUS

## 2018-01-03 MED ORDER — PROPOFOL 10 MG/ML IV BOLUS
INTRAVENOUS | Status: AC
  Filled 2018-01-03: qty 20

## 2018-01-03 MED ORDER — DOCUSATE SODIUM 100 MG PO CAPS
100.0000 mg | ORAL_CAPSULE | Freq: Two times a day (BID) | ORAL | Status: DC
  Administered 2018-01-03 – 2018-01-06 (×6): 100 mg via ORAL
  Filled 2018-01-03 (×6): qty 1

## 2018-01-03 MED ORDER — BUPIVACAINE-EPINEPHRINE 0.25% -1:200000 IJ SOLN
INTRAMUSCULAR | Status: DC | PRN
  Administered 2018-01-03: 30 mL

## 2018-01-03 MED ORDER — METHOCARBAMOL 500 MG PO TABS: ORAL_TABLET | ORAL | 0 refills | Status: DC

## 2018-01-03 MED ORDER — PROPOFOL 10 MG/ML IV BOLUS
INTRAVENOUS | Status: DC | PRN
  Administered 2018-01-03: 10 mg via INTRAVENOUS
  Administered 2018-01-03: 60 mg via INTRAVENOUS
  Administered 2018-01-03: 20 mg via INTRAVENOUS

## 2018-01-03 MED ORDER — PROMETHAZINE HCL 25 MG/ML IJ SOLN: 6.2500 mg | INTRAMUSCULAR | Status: DC | PRN

## 2018-01-03 MED ORDER — METOCLOPRAMIDE HCL 5 MG/ML IJ SOLN
5.0000 mg | Freq: Three times a day (TID) | INTRAMUSCULAR | Status: DC | PRN
Start: 2018-01-03 — End: 2018-01-06
  Administered 2018-01-03: 10 mg via INTRAVENOUS
  Filled 2018-01-03: qty 2

## 2018-01-03 MED ORDER — DIPHENHYDRAMINE HCL 12.5 MG/5ML PO ELIX: 12.5000 mg | ORAL_SOLUTION | ORAL | Status: DC | PRN

## 2018-01-03 MED ORDER — LACTATED RINGERS IV SOLN
INTRAVENOUS | Status: DC
  Administered 2018-01-03: 07:00:00 via INTRAVENOUS

## 2018-01-03 MED ORDER — HYDROMORPHONE HCL 1 MG/ML IJ SOLN
0.2500 mg | INTRAMUSCULAR | Status: DC | PRN
  Administered 2018-01-03: 0.25 mg via INTRAVENOUS

## 2018-01-03 MED ORDER — BUPIVACAINE IN DEXTROSE 0.75-8.25 % IT SOLN
INTRATHECAL | Status: DC | PRN
  Administered 2018-01-03: 2 mL via INTRATHECAL

## 2018-01-03 SURGICAL SUPPLY — 70 items
BAG DECANTER FOR FLEXI CONT (MISCELLANEOUS) ×3 IMPLANT
BANDAGE ACE 4X5 VEL STRL LF (GAUZE/BANDAGES/DRESSINGS) ×2 IMPLANT
BANDAGE ACE 6X5 VEL STRL LF (GAUZE/BANDAGES/DRESSINGS) ×4 IMPLANT
BANDAGE ESMARK 6X9 LF (GAUZE/BANDAGES/DRESSINGS) ×1 IMPLANT
BLADE SAGITTAL 25.0X1.19X90 (BLADE) ×2 IMPLANT
BLADE SAGITTAL 25.0X1.19X90MM (BLADE) ×1
BNDG CMPR 9X6 STRL LF SNTH (GAUZE/BANDAGES/DRESSINGS) ×1
BNDG ESMARK 6X9 LF (GAUZE/BANDAGES/DRESSINGS) ×3
BOWL SMART MIX CTS (DISPOSABLE) ×3 IMPLANT
CAP KNEE TOTAL 3 SIGMA ×2 IMPLANT
CEMENT HV SMART SET (Cement) ×6 IMPLANT
COVER SURGICAL LIGHT HANDLE (MISCELLANEOUS) ×3 IMPLANT
CUFF TOURNIQUET SINGLE 34IN LL (TOURNIQUET CUFF) ×3 IMPLANT
CUFF TOURNIQUET SINGLE 44IN (TOURNIQUET CUFF) IMPLANT
DECANTER SPIKE VIAL GLASS SM (MISCELLANEOUS) ×3 IMPLANT
DRAPE EXTREMITY T 121X128X90 (DRAPE) ×3 IMPLANT
DRAPE HALF SHEET 40X57 (DRAPES) ×6 IMPLANT
DRSG ADAPTIC 3X8 NADH LF (GAUZE/BANDAGES/DRESSINGS) ×5 IMPLANT
DRSG PAD ABDOMINAL 8X10 ST (GAUZE/BANDAGES/DRESSINGS) ×6 IMPLANT
DURAPREP 26ML APPLICATOR (WOUND CARE) ×8 IMPLANT
ELECT CAUTERY BLADE 6.4 (BLADE) ×3 IMPLANT
ELECT REM PT RETURN 9FT ADLT (ELECTROSURGICAL) ×3
ELECTRODE REM PT RTRN 9FT ADLT (ELECTROSURGICAL) ×1 IMPLANT
EVACUATOR 1/8 PVC DRAIN (DRAIN) IMPLANT
FACESHIELD WRAPAROUND (MASK) ×6 IMPLANT
FACESHIELD WRAPAROUND OR TEAM (MASK) ×2 IMPLANT
GAUZE SPONGE 4X4 12PLY STRL (GAUZE/BANDAGES/DRESSINGS) ×3 IMPLANT
GLOVE BIO SURGEON STRL SZ8 (GLOVE) ×2 IMPLANT
GLOVE BIOGEL PI IND STRL 8 (GLOVE) ×1 IMPLANT
GLOVE BIOGEL PI IND STRL 8.5 (GLOVE) ×1 IMPLANT
GLOVE BIOGEL PI INDICATOR 8 (GLOVE) ×2
GLOVE BIOGEL PI INDICATOR 8.5 (GLOVE) ×2
GLOVE ECLIPSE 8.0 STRL XLNG CF (GLOVE) ×8 IMPLANT
GLOVE ECLIPSE 8.5 STRL (GLOVE) ×6 IMPLANT
GOWN STRL REUS W/ TWL LRG LVL3 (GOWN DISPOSABLE) ×2 IMPLANT
GOWN STRL REUS W/TWL 2XL LVL3 (GOWN DISPOSABLE) ×3 IMPLANT
GOWN STRL REUS W/TWL LRG LVL3 (GOWN DISPOSABLE) ×6
HANDPIECE INTERPULSE COAX TIP (DISPOSABLE) ×3
KIT BASIN OR (CUSTOM PROCEDURE TRAY) ×3 IMPLANT
KIT TURNOVER KIT B (KITS) ×3 IMPLANT
MANIFOLD NEPTUNE II (INSTRUMENTS) ×3 IMPLANT
NEEDLE 22X1 1/2 (OR ONLY) (NEEDLE) ×3 IMPLANT
NS IRRIG 1000ML POUR BTL (IV SOLUTION) ×3 IMPLANT
PACK TOTAL JOINT (CUSTOM PROCEDURE TRAY) ×3 IMPLANT
PAD ARMBOARD 7.5X6 YLW CONV (MISCELLANEOUS) ×6 IMPLANT
PAD CAST 4YDX4 CTTN HI CHSV (CAST SUPPLIES) ×1 IMPLANT
PADDING CAST ABS 6INX4YD NS (CAST SUPPLIES) ×2
PADDING CAST ABS COTTON 6X4 NS (CAST SUPPLIES) IMPLANT
PADDING CAST COTTON 4X4 STRL (CAST SUPPLIES)
PADDING CAST COTTON 6X4 STRL (CAST SUPPLIES) ×3 IMPLANT
SET HNDPC FAN SPRY TIP SCT (DISPOSABLE) ×1 IMPLANT
SLEEVE SURGEON STRL (DRAPES) ×2 IMPLANT
SPONGE LAP 18X18 RF (DISPOSABLE) ×2 IMPLANT
STAPLER VISISTAT 35W (STAPLE) ×3 IMPLANT
SUCTION FRAZIER HANDLE 10FR (MISCELLANEOUS) ×2
SUCTION TUBE FRAZIER 10FR DISP (MISCELLANEOUS) ×1 IMPLANT
SURGIFLO W/THROMBIN 8M KIT (HEMOSTASIS) IMPLANT
SUT BONE WAX W31G (SUTURE) ×3 IMPLANT
SUT ETHIBOND NAB CT1 #1 30IN (SUTURE) ×6 IMPLANT
SUT MNCRL AB 3-0 PS2 18 (SUTURE) ×3 IMPLANT
SUT VIC AB 0 CT1 27 (SUTURE) ×3
SUT VIC AB 0 CT1 27XBRD ANBCTR (SUTURE) ×1 IMPLANT
SUT VIC AB 2-0 CT1 27 (SUTURE) ×15
SUT VIC AB 2-0 CT1 TAPERPNT 27 (SUTURE) IMPLANT
SYR CONTROL 10ML LL (SYRINGE) IMPLANT
TOWEL OR 17X24 6PK STRL BLUE (TOWEL DISPOSABLE) ×3 IMPLANT
TOWEL OR 17X26 10 PK STRL BLUE (TOWEL DISPOSABLE) ×3 IMPLANT
TRAY FOLEY BAG SILVER LF 16FR (SET/KITS/TRAYS/PACK) ×3 IMPLANT
TRAY FOLEY W/BAG SLVR 14FR (SET/KITS/TRAYS/PACK) ×2 IMPLANT
WRAP KNEE MAXI GEL POST OP (GAUZE/BANDAGES/DRESSINGS) ×1 IMPLANT

## 2018-01-03 NOTE — Anesthesia Procedure Notes (Signed)
Spinal  Patient location during procedure: OR Start time: 01/03/2018 7:14 AM End time: 01/03/2018 7:21 AM Staffing Anesthesiologist: Trevor IhaHouser, Srihari Shellhammer A, MD Spinal Block Patient position: sitting Prep: ChloraPrep Patient monitoring: heart rate, cardiac monitor, continuous pulse ox and blood pressure Approach: midline Location: L2-3 Injection technique: single-shot Needle Needle type: Pencan  Needle gauge: 25 G Needle length: 9 cm Needle insertion depth: 6 cm

## 2018-01-03 NOTE — Anesthesia Postprocedure Evaluation (Signed)
Anesthesia Post Note  Patient: Benson SettingSusan J Brotzman  Procedure(s) Performed: RIGHT TOTAL KNEE ARTHROPLASTY (Right Knee)     Patient location during evaluation: PACU Anesthesia Type: Regional and Spinal Level of consciousness: oriented and awake and alert Pain management: pain level controlled Vital Signs Assessment: post-procedure vital signs reviewed and stable Respiratory status: spontaneous breathing, respiratory function stable and patient connected to nasal cannula oxygen Cardiovascular status: blood pressure returned to baseline and stable Postop Assessment: no headache, no backache and no apparent nausea or vomiting Anesthetic complications: no    Last Vitals:  Vitals:   01/03/18 0953 01/03/18 1007  BP: (!) 94/55 103/70  Pulse: 91 76  Resp: (!) 21 12  Temp: (!) 36.3 C   SpO2: 100% 98%    Last Pain:  Vitals:   01/03/18 0953  PainSc: 0-No pain                 Trevor IhaStephen A Daltin Crist

## 2018-01-03 NOTE — H&P (Signed)
The recent History & Physical has been reviewed. I have personally examined the patient today. There is no interval change to the documented History & Physical. The patient would like to proceed with the procedure.  Valeria Batmaneter W Verdun Rackley 01/03/2018,  7:06 AM

## 2018-01-03 NOTE — Anesthesia Procedure Notes (Signed)
Anesthesia Regional Block: Adductor canal block   Pre-Anesthetic Checklist: ,, timeout performed, Correct Patient, Correct Site, Correct Laterality, Correct Procedure, Correct Position, site marked, Risks and benefits discussed,  Surgical consent,  Pre-op evaluation,  At surgeon's request and post-op pain management  Laterality: Right  Prep: Maximum Sterile Barrier Precautions used, chloraprep       Needles:  Injection technique: Single-shot  Needle Type: Echogenic Needle     Needle Length: 9cm  Needle Gauge: 21     Additional Needles:   Procedures:,,,, ultrasound used (permanent image in chart),,,,  Narrative:  Start time: 01/03/2018 6:50 AM End time: 01/03/2018 7:04 AM Injection made incrementally with aspirations every 5 mL.  Performed by: Personally  Anesthesiologist: Trevor IhaHouser, Camaron Cammack A, MD

## 2018-01-03 NOTE — Op Note (Signed)
NAME: BAKER, KOGLER MEDICAL RECORD [id number] ACCOUNT [id number] DATE OF BIRTH:12/13/1950 FACILITY: [facility] LOCATION: [facility] PHYSICIAN:PETER Sharlotte Alamo, MD  OPERATIVE REPORT  DATE OF PROCEDURE:  01/03/2018  PREOPERATIVE DIAGNOSIS:  End-stage osteoarthritis, right knee.  POSTOPERATIVE DIAGNOSIS:  End-stage osteoarthritis, right knee.  PROCEDURE:  Right total knee replacement.  SURGEON:   Joni Fears, MD  ASSISTANT:  Sharman Crate PA-C.  ANESTHESIA:  Spinal with IV sedation and adductor canal block, right lower extremity.  ESTIMATED BLOOD LOSS:  None.  COMPONENTS:  DePuy LCS standard plus femoral component, a #3 rotating keeled tibial tray with a 10 mm polyethylene bridging bearing and metal backed 3 peg rotating patella.  Components were secured with polymethyl methacrylate.  DESCRIPTION OF PROCEDURE:  The patient was initially met in the holding area and identified the right knee as appropriate operative site and marked it accordingly.  Then Anesthesia performed an adductor canal block.  The patient was then transported on the operating room stretcher to room #7.  Spinal anesthesia was performed by Anesthesia without difficulty.  She was comfortably placed on the operating table.  Nursing staff inserted a Foley catheter.  Urine was clear.  Right lower extremity was then placed in a thigh tourniquet.  The leg was prepped with chlorhexidine scrubbed with DuraPrep x2 from the tourniquet to the tips of the toes.  Sterile draping was performed.  Timeout was called.  The right lower extremity was then [facility] with a proximal tourniquet at 325 mmHg.  Timeout was called again.  A midline longitudinal incision was made centered about the patella extending from the superior pouch to the tibial tubercle.  Via sharp dissection, incision was carried down to subcutaneous tissue.  Abundant adipose tissue was incised with the Bovie.   First layer of capsule was incised in  the midline.  Medial parapatellar incision was then made with the Bovie.  The joint was entered.  There was a small clear yellow joint effusion.  The patella was everted 180 degrees laterally and the knee flexed to 90 degrees.  There was a moderate amount of beefy red synovitis.  Synovectomy was performed with the Bovie.  There were large areas of articular cartilage loss on the lateral more than the medial femoral condyle.  There was slight increased varus, but it was not in a fixed position.  Osteophytes were removed.  I measured the standard plus femoral component.  First bony cut was then made transversely on the proximal tibia using the external tibial guide and 7 degrees angle declination.  After each bony cut in the tibia and femur.  I checked the appropriate alignment with the external alignment jig.  Subsequent cuts were then made on the femur using the standard plus jigs.  I used a 4 degree distal femoral valgus cut.  Laminar spreaders were then inserted along the medial and lateral compartment.  Using the Bovie.  I removed medial and lateral menisci as well as ACL and PCL.  Osteophytes were removed from the posterior femoral condyles.  MCL and LCL remained intact throughout the operative procedure.  Flexion and extension gaps were perfectly symmetrical at 10 mm.  Final cuts were then made in the femur for tapering purposes to obtain the center holes.  Retractors were then placed about the tibia.  It was advanced anteriorly.  It measured a #3 tibial tray and pinned it in place.  I did check the alignment with the external jig.  Central hole was then made followed by the keel cut.  With the tibial jig still in place a 10 mm polyethylene bridging bearing was inserted.  This was followed by the trial standard plus femoral component.  Our construct was reduced and through a full  range of motion.  There was no instability.  The patella was prepared by removing about 9 mm of bone  leaving 12 mm of patella thickness.  Three holes were made and the trial patella inserted.  This was reduced and through a full range of motion and it remained stable.  The trial components were removed.  The joint was then copiously irrigated with saline solution.  Final components were impacted with polymethyl methacrylate.  Initially applied the 3 tibial tray followed by the 10 mm polyethylene bridging bearing in the standard plus femoral component.  We had very nice fit with the components.  With the knee in  extension, I have either further compression of the components.  Extraneous methacrylate was removed from the periphery of the femur and the tibia.  The patella was applied with the patella clamp.  At approximately 16 minutes, the methacrylate had  matured during which time we irrigated the joint and injected the deep capsule with 0.25% Marcaine with epinephrine.  Once the methacrylate had matured the joint was explored.  Any further extraneous methacrylate was removed with the osteotome.  Joint was irrigated with saline solution.  At 78 minutes, the tourniquet was released.  Gross bleeders were Bovie coagulated.  The joint was then compressed with a topical tranexamic acid under compression.  Approximately 5 minutes, the joint was then  explored with very minimal bleeding.  The deep capsule was then closed with a running #1 Ethibond superficial capsule with 0 Vicryl, subcutaneous with 3-0 Monocryl, skin closed with skin clips.  Sterile bulky dressing was applied followed by the  patient's support stocking.  The patient tolerated the procedure well without complications.  AN/NUANCE  D:01/03/2018 T:01/03/2018 JOB:[id number]

## 2018-01-03 NOTE — Progress Notes (Signed)
PATIENT ID:      Lauren SettingSusan J Frye  MRN:     [id number] DOB/AGE:    [birth year] / 67 y.o.       OPERATIVE REPORT    DATE OF PROCEDURE:  01/03/2018       PREOPERATIVE DIAGNOSIS: END STAGE  RIGHT KNEE OSTEOARTHRITIS                                                       Estimated body mass index is 35.51 kg/m as calculated from the following:   Height as of 12/23/17: 5\' 6"  (1.676 m).   Weight as of 12/23/17: 220 lb (99.8 kg).     POSTOPERATIVE DIAGNOSIS:END STAGE   RIGHT KNEE OSTEOARTHRITIS                                                                     Estimated body mass index is 35.51 kg/m as calculated from the following:   Height as of 12/23/17: 5\' 6"  (1.676 m).   Weight as of 12/23/17: 220 lb (99.8 kg).     PROCEDURE:  Procedure(s): RIGHT TOTAL KNEE ARTHROPLASTY      SURGEON:  Norlene CampbellPeter Fallou Hulbert, MD    ASSISTANT:  Mikey KirschnerLindsey Stanberry PA-C   (Present and scrubbed throughout the case, critical for assistance with exposure, retraction, instrumentation, and closure.)          ANESTHESIA: regional, spinal and IV sedation     DRAINS: none :      TOURNIQUET TIME:  Total Tourniquet Time Documented: Thigh (Right) - 78 minutes Total: Thigh (Right) - 78 minutes     COMPLICATIONS:  None   CONDITION:  stable  PROCEDURE IN DETAIL: [id number]  Lauren Batmaneter W Lauren Frye 01/03/2018, 9:20 AM  Patient ID: Lauren SettingSusan J Frye, female   DOB: [birth year], 67 y.o.   MRN: [id number]

## 2018-01-03 NOTE — Transfer of Care (Signed)
Immediate Anesthesia Transfer of Care Note  Patient: Lauren Frye  Procedure(s) Performed: RIGHT TOTAL KNEE ARTHROPLASTY (Right Knee)  Patient Location: PACU  Anesthesia Type:MAC combined with regional for post-op pain  Level of Consciousness: awake, alert , oriented and patient cooperative  Airway & Oxygen Therapy: Patient Spontanous Breathing and Patient connected to face mask oxygen  Post-op Assessment: Report given to RN and Post -op Vital signs reviewed and stable  Post vital signs: Reviewed and stable  Last Vitals:  Vitals Value Taken Time  BP 94/55 01/03/2018  9:53 AM  Temp    Pulse 88 01/03/2018  9:56 AM  Resp 17 01/03/2018  9:56 AM  SpO2 99 % 01/03/2018  9:56 AM  Vitals shown include unvalidated device data.  Last Pain:  Vitals:   01/03/18 0628  PainSc: 0-No pain         Complications: No apparent anesthesia complications

## 2018-01-03 NOTE — Progress Notes (Signed)
Orthopedic Tech Progress Note Patient Details:  Benson SettingSusan J Quam Nov 24, 1950 [id number]  CPM Right Knee CPM Right Knee: On Right Knee Flexion (Degrees): 90 Right Knee Extension (Degrees): 0 Additional Comments: trapeze bar patient helper Viewed order from doctor's order list Post Interventions Patient Tolerated: Well Instructions Provided: Care of device  Nikki DomCrawford, Juda Lajeunesse 01/03/2018, 10:42 AM

## 2018-01-03 NOTE — Evaluation (Signed)
Physical Therapy Evaluation Patient Details Name: Lauren Frye MRN: [id number] DOB: 1951/01/19 Today's Date: 01/03/2018   History of Present Illness  67 y.o. female admitted 01/03/18 for elective R TKA.  Pt with significant PMH of HA, L TKA (10/2015), and ankle fusion.  Clinical Impression  Pt is POD #0 and is limited by nausea and lightheadedness, but was still able to get OOB to chair with heavy min assist and RW.  She lives alone and will need physical help at discharge.   PT to follow acutely for deficits listed below.       Follow Up Recommendations Follow surgeon's recommendation for DC plan and follow-up therapies;Supervision for mobility/OOB    Equipment Recommendations  None recommended by PT    Recommendations for Other Services   NA     Precautions / Restrictions Precautions Precautions: Knee Precaution Booklet Issued: Yes (comment) Precaution Comments: knee exercise handout give and precaution reviewed.  Restrictions Weight Bearing Restrictions: Yes RLE Weight Bearing: Partial weight bearing RLE Partial Weight Bearing Percentage or Pounds: 50%      Mobility  Bed Mobility Overal bed mobility: Needs Assistance Bed Mobility: Supine to Sit     Supine to sit: Min assist;HOB elevated     General bed mobility comments: Min assist to help progress right leg to EOB.   Transfers Overall transfer level: Needs assistance Equipment used: Rolling walker (2 wheeled) Transfers: Sit to/from Stand Sit to Stand: Min assist;From elevated surface         General transfer comment: Heavy min assist to boost trunk up over weak legs.  Verbal cues for safe hand placement.    Ambulation/Gait Ambulation/Gait assistance: Min assist Gait Distance (Feet): 5 Feet Assistive device: Rolling walker (2 wheeled) Gait Pattern/deviations: Step-to pattern;Antalgic     General Gait Details: Pt with moderately antalgic gait pattern, verbal cues for safe RW use.  Pt reported  lightheadedness and nausea, so limited gait distance to chair.          Balance Overall balance assessment: Needs assistance Sitting-balance support: Feet supported;Bilateral upper extremity supported Sitting balance-Leahy Scale: Fair     Standing balance support: Bilateral upper extremity supported Standing balance-Leahy Scale: Poor                               Pertinent Vitals/Pain Pain Assessment: 0-10 Pain Score: 4  Pain Location: right knee Pain Descriptors / Indicators: Aching;Burning Pain Intervention(s): Limited activity within patient's tolerance;Monitored during session;Premedicated before session;Repositioned    Home Living Family/patient expects to be discharged to:: Skilled nursing facility([facility]) Living Arrangements: Alone   Type of Home: House Home Access: Stairs to enter Entrance Stairs-Rails: None Entrance Stairs-Number of Steps: 2   Home Equipment: Emergency planning/management officer - 2 wheels;Cane - single point Additional Comments: cat named shirley    Prior Function Level of Independence: Independent         Comments: wore a brace on her right leg, but no AD.      Hand Dominance   Dominant Hand: Right    Extremity/Trunk Assessment   Upper Extremity Assessment Upper Extremity Assessment: Overall WFL for tasks assessed    Lower Extremity Assessment Lower Extremity Assessment: RLE deficits/detail RLE Deficits / Details: right leg with normal post op pain and weakness, ankle at least 3/5, knee 2/5, hip flexion 2+/5    Cervical / Trunk Assessment Cervical / Trunk Assessment: Other exceptions Cervical / Trunk Exceptions: pt reports chronic back  issues (buldging discs).   Communication   Communication: No difficulties  Cognition Arousal/Alertness: Awake/alert Behavior During Therapy: WFL for tasks assessed/performed Overall Cognitive Status: Within Functional Limits for tasks assessed                                            Exercises Total Joint Exercises Ankle Circles/Pumps: AROM;Both;20 reps   Assessment/Plan    PT Assessment Patient needs continued PT services  PT Problem List Decreased strength;Decreased range of motion;Decreased activity tolerance;Decreased balance;Decreased mobility;Decreased knowledge of use of DME;Decreased knowledge of precautions;Pain       PT Treatment Interventions DME instruction;Gait training;Stair training;Functional mobility training;Therapeutic activities;Therapeutic exercise;Balance training;Patient/family education;Manual techniques;Modalities    PT Goals (Current goals can be found in the Care Plan section)  Acute Rehab PT Goals Patient Stated Goal: to go to rehab and then go home.  PT Goal Formulation: With patient Time For Goal Achievement: 01/10/18 Potential to Achieve Goals: Good    Frequency 7X/week           AM-PAC PT "6 Clicks" Daily Activity  Outcome Measure Difficulty turning over in bed (including adjusting bedclothes, sheets and blankets)?: A Little Difficulty moving from lying on back to sitting on the side of the bed? : A Little Difficulty sitting down on and standing up from a chair with arms (e.g., wheelchair, bedside commode, etc,.)?: A Little Help needed moving to and from a bed to chair (including a wheelchair)?: A Little Help needed walking in hospital room?: A Little Help needed climbing 3-5 steps with a railing? : A Lot 6 Click Score: 17    End of Session   Activity Tolerance: Other (comment)(limited by lightheadedness and nausea) Patient left: in chair;with call bell/phone within reach Nurse Communication: Mobility status PT Visit Diagnosis: Muscle weakness (generalized) (M62.81);Difficulty in walking, not elsewhere classified (R26.2);Pain Pain - Right/Left: Right Pain - part of body: Knee    Time: [id number] PT Time Calculation (min) (ACUTE ONLY): 29 min   Charges:        Lurena Joinerebecca B. Chistian Kasler, PT, DPT  [phone]   PT Evaluation $PT Eval Moderate Complexity: 1 Mod PT Treatments $Therapeutic Activity: 8-22 mins   01/03/2018, 9:47 PM

## 2018-01-03 NOTE — Discharge Instructions (Signed)
INSTRUCTIONS AFTER JOINT REPLACEMENT   o Remove items at home which could result in a fall. This includes throw rugs or furniture in walking pathways o ICE to the affected joint every three hours while awake for 30 minutes at a time, for at least the first 3-5 days, and then as needed for pain and swelling.  Continue to use ice for pain and swelling. You may notice swelling that will progress down to the foot and ankle.  This is normal after surgery.  Elevate your leg when you are not up walking on it.   o Continue to use the breathing machine you got in the hospital (incentive spirometer) which will help keep your temperature down.  It is common for your temperature to cycle up and down following surgery, especially at night when you are not up moving around and exerting yourself.  The breathing machine keeps your lungs expanded and your temperature down.   DIET:  As you were doing prior to hospitalization, we recommend a well-balanced diet.  DRESSING / WOUND CARE / SHOWERING  You may change your dressing 3-5 days after surgery.  Then change the dressing every day with sterile gauze.  Please use good hand washing techniques before changing the dressing.  Do not use any lotions or creams on the incision until instructed by your surgeon.  ACTIVITY  o Increase activity slowly as tolerated, but follow the weight bearing instructions below.   o No driving for 6 weeks or until further direction given by your physician.  You cannot drive while taking narcotics.  o No lifting or carrying greater than 10 lbs. until further directed by your surgeon. o Avoid periods of inactivity such as sitting longer than an hour when not asleep. This helps prevent blood clots.  o You may return to work once you are authorized by your doctor.     WEIGHT BEARING   Weight bearing as tolerated with assist device (walker, cane, etc) as directed, use it as long as suggested by your surgeon or therapist, typically at  least 4-6 weeks. 50% weight bearing  EXERCISES  Results after joint replacement surgery are often greatly improved when you follow the exercise, range of motion and muscle strengthening exercises prescribed by your doctor. Safety measures are also important to protect the joint from further injury. Any time any of these exercises cause you to have increased pain or swelling, decrease what you are doing until you are comfortable again and then slowly increase them. If you have problems or questions, call your caregiver or physical therapist for advice.   Rehabilitation is important following a joint replacement. After just a few days of immobilization, the muscles of the leg can become weakened and shrink (atrophy).  These exercises are designed to build up the tone and strength of the thigh and leg muscles and to improve motion. Often times heat used for twenty to thirty minutes before working out will loosen up your tissues and help with improving the range of motion but do not use heat for the first two weeks following surgery (sometimes heat can increase post-operative swelling).   These exercises can be done on a training (exercise) mat, on the floor, on a table or on a bed. Use whatever works the best and is most comfortable for you.    Use music or television while you are exercising so that the exercises are a pleasant break in your day. This will make your life better with the exercises acting as  a break in your routine that you can look forward to.   Perform all exercises about fifteen times, three times per day or as directed.  You should exercise both the operative leg and the other leg as well.  Exercises include:    Quad Sets - Tighten up the muscle on the front of the thigh (Quad) and hold for 5-10 seconds.    Straight Leg Raises - With your knee straight (if you were given a brace, keep it on), lift the leg to 60 degrees, hold for 3 seconds, and slowly lower the leg.  Perform this  exercise against resistance later as your leg gets stronger.   Leg Slides: Lying on your back, slowly slide your foot toward your buttocks, bending your knee up off the floor (only go as far as is comfortable). Then slowly slide your foot back down until your leg is flat on the floor again.   Angel Wings: Lying on your back spread your legs to the side as far apart as you can without causing discomfort.   Hamstring Strength:  Lying on your back, push your heel against the floor with your leg straight by tightening up the muscles of your buttocks.  Repeat, but this time bend your knee to a comfortable angle, and push your heel against the floor.  You may put a pillow under the heel to make it more comfortable if necessary.   A rehabilitation program following joint replacement surgery can speed recovery and prevent re-injury in the future due to weakened muscles. Contact your doctor or a physical therapist for more information on knee rehabilitation.    CONSTIPATION  Constipation is defined medically as fewer than three stools per week and severe constipation as less than one stool per week.  Even if you have a regular bowel pattern at home, your normal regimen is likely to be disrupted due to multiple reasons following surgery.  Combination of anesthesia, postoperative narcotics, change in appetite and fluid intake all can affect your bowels.   YOU MUST use at least one of the following options; they are listed in order of increasing strength to get the job done.  They are all available over the counter, and you may need to use some, POSSIBLY even all of these options:    Drink plenty of fluids (prune juice may be helpful) and high fiber foods Colace 100 mg by mouth twice a day  Senokot for constipation as directed and as needed Dulcolax (bisacodyl), take with full glass of water  Miralax (polyethylene glycol) once or twice a day as needed.  If you have tried all these things and are unable to  have a bowel movement in the first 3-4 days after surgery call either your surgeon or your primary doctor.    If you experience loose stools or diarrhea, hold the medications until you stool forms back up.  If your symptoms do not get better within 1 week or if they get worse, check with your doctor.  If you experience "the worst abdominal pain ever" or develop nausea or vomiting, please contact the office immediately for further recommendations for treatment.   ITCHING:  If you experience itching with your medications, try taking only a single pain pill, or even half a pain pill at a time.  You can also use Benadryl over the counter for itching or also to help with sleep.   TED HOSE STOCKINGS:  Use stockings on both legs until for at least 2 weeks  or as directed by physician office. They may be removed at night for sleeping.  MEDICATIONS:  See your medication summary on the After Visit Summary that nursing will review with you.  You may have some home medications which will be placed on hold until you complete the course of blood thinner medication.  It is important for you to complete the blood thinner medication as prescribed.  PRECAUTIONS:  If you experience chest pain or shortness of breath - call 911 immediately for transfer to the hospital emergency department.   If you develop a fever greater that 101 F, purulent drainage from wound, increased redness or drainage from wound, foul odor from the wound/dressing, or calf pain - CONTACT YOUR SURGEON.                                                   FOLLOW-UP APPOINTMENTS:  If you do not already have a post-op appointment, please call the office for an appointment to be seen by your surgeon.  Guidelines for how soon to be seen are listed in your After Visit Summary, but are typically between 1-4 weeks after surgery.  OTHER INSTRUCTIONS:   Knee Replacement:  Do not place pillow under knee, focus on keeping the knee straight while resting.  CPM instructions: 0-90 degrees, 2 hours in the morning, 2 hours in the afternoon, and 2 hours in the evening. Place foam block, curve side up under heel at all times except when in CPM or when walking.  DO NOT modify, tear, cut, or change the foam block in any way.  MAKE SURE YOU:   Understand these instructions.   Get help right away if you are not doing well or get worse.    Thank you for letting us be a part of your medical care team.  It is a privilege we respect greatly.  We hope these instructions will help you stay on track for a fast and full recovery!

## 2018-01-04 ENCOUNTER — Encounter (HOSPITAL_COMMUNITY): Payer: Self-pay | Admitting: Orthopaedic Surgery

## 2018-01-04 DIAGNOSIS — M1711 Unilateral primary osteoarthritis, right knee: Secondary | ICD-10-CM | POA: Diagnosis not present

## 2018-01-04 LAB — CBC
HCT: 32.9 % — ABNORMAL LOW (ref 36.0–46.0)
Hemoglobin: 10.7 g/dL — ABNORMAL LOW (ref 12.0–15.0)
MCH: 29.9 pg (ref 26.0–34.0)
MCHC: 32.5 g/dL (ref 30.0–36.0)
MCV: 91.9 fL (ref 78.0–100.0)
Platelets: 210 10*3/uL (ref 150–400)
RBC: 3.58 MIL/uL — ABNORMAL LOW (ref 3.87–5.11)
RDW: 12.8 % (ref 11.5–15.5)
WBC: 9.3 10*3/uL (ref 4.0–10.5)

## 2018-01-04 LAB — BASIC METABOLIC PANEL WITH GFR
Anion gap: 4 — ABNORMAL LOW (ref 5–15)
BUN: 17 mg/dL (ref 8–23)
CO2: 27 mmol/L (ref 22–32)
Calcium: 7.9 mg/dL — ABNORMAL LOW (ref 8.9–10.3)
Chloride: 103 mmol/L (ref 98–111)
Creatinine, Ser: 0.93 mg/dL (ref 0.44–1.00)
GFR calc Af Amer: >60 mL/min
GFR calc non Af Amer: >60 mL/min
Glucose, Bld: 106 mg/dL — ABNORMAL HIGH (ref 70–99)
Potassium: 4.1 mmol/L (ref 3.5–5.1)
Sodium: 134 mmol/L — ABNORMAL LOW (ref 135–145)

## 2018-01-04 NOTE — Progress Notes (Signed)
PATIENT ID: Lauren Frye J Frye        MRN:  [id number]          DOB/AGE: 10/13/1950 / 67 y.o.    Lauren CampbellPeter Frederich Montilla, MD   Lauren CodeBrian Petrarca, PA-C [address] [place], [place]  [id number]                             [phone]   PROGRESS NOTE  Subjective:  negative for Chest Pain  negative for Shortness of Breath  negative for Nausea/Vomiting   negative for Calf Pain    Tolerating Diet: yes         Patient reports pain as mild.     Comfortable with dilaudid  Objective: Vital signs in last 24 hours:    Patient Vitals for the past 24 hrs:  BP Temp Temp src Pulse Resp SpO2  01/04/18 0421 (!) 95/56 - - 66 - 98 %  01/03/18 2345 (!) 93/54 98.5 F (36.9 C) Oral 91 16 95 %  01/03/18 1949 (!) 91/49 98.5 F (36.9 C) Oral 82 16 100 %  01/03/18 1621 94/63 98.5 F (36.9 C) Oral 78 16 96 %  01/03/18 1517 100/66 - - 77 16 98 %  01/03/18 1452 101/60 (!) 97.3 F (36.3 C) - 71 13 99 %  01/03/18 1437 99/60 - - 72 15 99 %  01/03/18 1422 98/60 - - 78 10 99 %  01/03/18 1417 (!) 95/59 - - 75 18 98 %  01/03/18 1407 (!) 88/55 - - 72 10 98 %  01/03/18 1400 96/63 - - 66 11 98 %  01/03/18 1355 (!) 73/60 - - 77 19 98 %  01/03/18 1335 - - - 93 (!) 21 99 %  01/03/18 1322 108/72 - - 80 14 100 %  01/03/18 1307 122/79 - - 86 14 100 %  01/03/18 1252 119/72 - - 77 (!) 9 100 %  01/03/18 1237 120/83 - - 84 (!) 24 100 %  01/03/18 1222 121/69 - - 78 10 100 %  01/03/18 1207 116/75 - - 78 16 100 %  01/03/18 1152 110/82 - - 78 (!) 23 100 %  01/03/18 1137 111/77 - - 70 13 100 %  01/03/18 1122 115/70 (!) 97.3 F (36.3 C) - 63 10 100 %  01/03/18 1107 107/79 - - 65 10 100 %  01/03/18 1052 117/74 - - 65 12 100 %  01/03/18 1037 (!) 99/44 - - 70 16 100 %  01/03/18 1022 125/85 - - 72 13 100 %  01/03/18 1007 103/70 - - 76 12 98 %  01/03/18 0953 (!) 94/55 (!) 97.3 F (36.3 C) - 91 (!) 21 100 %      Intake/Output from previous day:   07/02 0701 - 07/03 0700 In: 1500 [I.V.:1500] Out: 2075 [Urine:2025]     Intake/Output this shift:   No intake/output data recorded.   Intake/Output      07/02 0701 - 07/03 0700 07/03 0701 - 07/04 0700   I.V. 1500    Total Intake 1500    Urine 2025    Blood 50    Total Output 2075    Net -575            LABORATORY DATA: Recent Labs    01/04/18 0459  WBC 9.3  HGB 10.7*  HCT 32.9*  PLT 210   Recent Labs    01/04/18 0459  NA 134*  K  4.1  CL 103  CO2 27  BUN 17  CREATININE 0.93  GLUCOSE 106*  CALCIUM 7.9*   Lab Results  Component Value Date   INR 0.99 12/23/2017   INR 0.90 09/24/2015   INR 0.91 07/21/2011    Recent Radiographic Studies :  Dg Chest 2 View  Result Date: 12/27/2017 CLINICAL DATA:  Follow-up possible atelectasis on preoperative study of December 23, 2017. EXAM: CHEST - 2 VIEW COMPARISON:  PA and lateral chest x-ray dated December 23, 2017 FINDINGS: The lungs are well-expanded. There is no focal infiltrate. There is no pleural effusion. No pulmonary parenchymal nodules or masses are observed. The heart and pulmonary vascularity are normal. The mediastinum is normal in width. There is no acute bony abnormality. IMPRESSION: No pulmonary parenchymal nodules or areas of atelectasis are observed. There is no acute cardiopulmonary abnormality. Electronically Signed   By: Lauren  Frye M.D.   On: 12/27/2017 10:17   Dg Chest 2 View  Result Date: 12/23/2017 CLINICAL DATA:  Patient admitted for right knee replacement. EXAM: CHEST - 2 VIEW COMPARISON:  09/24/2015. FINDINGS: Mediastinum hilar structures are normal. Questionable nodular opacity in the right lung base. This may represent a small area of atelectasis. Repeat PA and lateral chest x-ray suggested. If this nodular density remains nonenhanced chest CT can be obtained. No acute cardiopulmonary disease otherwise noted. No pleural effusion pneumothorax. Degenerative change thoracic spine. IMPRESSION: 1. Nodular density noted projected over the right lung base. This may represent a small area of  atelectasis. Repeat PA lateral chest x-ray suggested. This nodular density remains nonenhanced chest CT can be obtained. 2.  No acute cardiopulmonary disease otherwise noted. Electronically Signed   By: Lauren Fus  Frye   On: 12/23/2017 08:51   Dg Knee Right Port  Result Date: 01/03/2018 CLINICAL DATA:  Status post right total knee joint prosthesis placement. EXAM: PORTABLE RIGHT KNEE - 1-2 VIEW COMPARISON:  MRI of the right knee of March 26, 2009 FINDINGS: The patient has undergone total right knee joint prosthesis placement. Radiographic positioning of the prosthetic components is good. The interface with the native bone is normal. A small amount of air in fluid is present anteriorly in the joint space. Skin staples are present. IMPRESSION: No immediate postprocedure complication following right total knee joint prosthesis placement. Electronically Signed   By: Lauren  Frye M.D.   On: 01/03/2018 10:30     Examination:  General appearance: alert, cooperative and no distress  Wound Exam: clean, dry, intact   Drainage:  None: wound tissue dry  Motor Exam: EHL, FHL, Anterior Tibial and Posterior Tibial Intact  Sensory Exam: Superficial Peroneal, Deep Peroneal and Tibial normal  Vascular Exam: Normal  Assessment:    1 Day Post-Op  Procedure(s) (LRB): RIGHT TOTAL KNEE ARTHROPLASTY (Right)  ADDITIONAL DIAGNOSIS:  Active Problems:   Primary localized osteoarthritis of right knee     Plan: Physical Therapy as ordered Partial Weight Bearing @ 50% (PWB)  DVT Prophylaxis:  Aspirin, Foot Pumps and TED hose  DISCHARGE PLAN: Skilled Nursing Facility/Rehab-[facility]  DISCHARGE NEEDS: HHPT, CPM, Walker and 3-in-1 comode seat OOB with PT, saline lock IV, hope for D/C tomorrow       Lauren Code, PA-C [facility]  01/04/2018 7:50 AM  Patient ID: Lauren Frye, female   DOB: 17-Nov-1950, 67 y.o.   MRN: [id number]

## 2018-01-04 NOTE — Plan of Care (Signed)
  Problem: Education: Goal: Knowledge of General Education information will improve Outcome: Progressing   

## 2018-01-04 NOTE — Progress Notes (Signed)
Physical Therapy Treatment Patient Details Name: Lauren Frye MRN: [id number] DOB: [birth year] Today's Date: 01/04/2018    History of Present Illness 67 y.o. female admitted 01/03/18 for elective R TKA.  Pt with significant PMH of HA, L TKA (10/2015), and ankle fusion.    PT Comments    POD #1 second session and pt is progressing slowly, able to progress gait distance a bit further in the hallway, but remains min assist for most mobility.  She is progressing through her HEP program nicely and was in the CPM when I arrived for the past 2 hours 0-90 degree.  PT will continue to follow acutely to progress gait and mobility with anticipated d/c to SNF level rehab.     Follow Up Recommendations  Follow surgeon's recommendation for DC plan and follow-up therapies;Supervision for mobility/OOB     Equipment Recommendations  None recommended by PT    Recommendations for Other Services   NA     Precautions / Restrictions Precautions Precautions: Knee Precaution Booklet Issued: Yes (comment) Precaution Comments: knee exercise handout give and precaution reviewed.  Restrictions Weight Bearing Restrictions: Yes RLE Weight Bearing: Partial weight bearing RLE Partial Weight Bearing Percentage or Pounds: 50%    Mobility  Bed Mobility Overal bed mobility: Needs Assistance Bed Mobility: Supine to Sit     Supine to sit: Min assist;HOB elevated     General bed mobility comments: Min assist to help progress right leg to EOB.   Transfers Overall transfer level: Needs assistance Equipment used: Rolling walker (2 wheeled) Transfers: Sit to/from Stand Sit to Stand: Min assist;From elevated surface         General transfer comment: min assist to support trunk during transitions to stand from elevated bed and elevated 3-in 1 in the bathroom.  Verbal cues for safe hand placement.  uncontrolled descent to lower recliner chair.   Ambulation/Gait Ambulation/Gait assistance: Min assist Gait  Distance (Feet): 60 Feet Assistive device: Rolling walker (2 wheeled) Gait Pattern/deviations: Step-to pattern;Antalgic     General Gait Details: Moderately antlagic gait pattern, cues for at least foot flat contact while still maintaining 50% PWB, but I did not want her to walk PF and make her R gastroc/soleus complex tight.           Balance Overall balance assessment: Needs assistance Sitting-balance support: Feet supported;No upper extremity supported Sitting balance-Leahy Scale: Good     Standing balance support: Bilateral upper extremity supported Standing balance-Leahy Scale: Poor Standing balance comment: standing at sink washing hands needs min guard assist for balance.                             Cognition Arousal/Alertness: Awake/alert Behavior During Therapy: WFL for tasks assessed/performed Overall Cognitive Status: Within Functional Limits for tasks assessed                                        Exercises Total Joint Exercises Short Arc QuadBarbaraann Boys;Right;10 reps Hip ABduction/ADduction: AAROM;Right;10 reps Straight Leg Raises: AAROM;Right;10 reps Goniometric ROM: (CPM was on 0-90 when I entered the room)        Pertinent Vitals/Pain Pain Assessment: Faces Faces Pain Scale: Hurts even more Pain Location: right knee Pain Descriptors / Indicators: Aching;Grimacing;Guarding Pain Intervention(s): Limited activity within patient's tolerance;Monitored during session;Repositioned;Ice applied  PT Goals (current goals can now be found in the care plan section) Acute Rehab PT Goals Patient Stated Goal: to go to rehab and then go home.  Progress towards PT goals: Progressing toward goals    Frequency    7X/week      PT Plan Current plan remains appropriate       AM-PAC PT "6 Clicks" Daily Activity  Outcome Measure  Difficulty turning over in bed (including adjusting bedclothes, sheets and blankets)?: A  Little Difficulty moving from lying on back to sitting on the side of the bed? : A Little Difficulty sitting down on and standing up from a chair with arms (e.g., wheelchair, bedside commode, etc,.)?: A Little Help needed moving to and from a bed to chair (including a wheelchair)?: A Little Help needed walking in hospital room?: A Little Help needed climbing 3-5 steps with a railing? : A Lot 6 Click Score: 17    End of Session   Activity Tolerance: Patient limited by pain Patient left: in chair;with call bell/phone within reach;with family/visitor present Nurse Communication: Mobility status PT Visit Diagnosis: Muscle weakness (generalized) (M62.81);Difficulty in walking, not elsewhere classified (R26.2);Pain Pain - Right/Left: Right Pain - part of body: Knee     Time: 1701-1726 PT Time Calculation (min) (ACUTE ONLY): 25 min  Charges:  $Gait Training: 8-22 mins $Therapeutic Exercise: 8-22 mins          Josilyn Shippee B. Ermagene Saidi, PT, DPT [phone]              01/04/2018, 5:29 PM

## 2018-01-04 NOTE — Progress Notes (Signed)
Physical Therapy Treatment Patient Details Name: Lauren Frye SettingSusan J Lotts MRN: [id number] DOB: Aug 24, 1950 Today's Date: 01/04/2018    History of Present Illness 67 y.o. female admitted 01/03/18 for elective R TKA.  Pt with significant PMH of HA, L TKA (10/2015), and ankle fusion.    PT Comments    Pt progressed to hallway ambulation, min assist overall.  She was able to start to review her HEP program and has increased pain today.  She remains appropriate for post acute rehab as she has no physical assist at home at discharge.  PT will continue to follow acutely to progress safe mobility.   Follow Up Recommendations  Follow surgeon's recommendation for DC plan and follow-up therapies;Supervision for mobility/OOB     Equipment Recommendations  None recommended by PT    Recommendations for Other Services   NA     Precautions / Restrictions Precautions Precautions: Knee Precaution Booklet Issued: Yes (comment) Precaution Comments: knee exercise handout give and precaution reviewed.  Restrictions Weight Bearing Restrictions: Yes RLE Weight Bearing: Partial weight bearing RLE Partial Weight Bearing Percentage or Pounds: 50%    Mobility  Bed Mobility Overal bed mobility: Needs Assistance Bed Mobility: Supine to Sit     Supine to sit: Min assist;HOB elevated     General bed mobility comments: Min assist to help progress right leg to EOB.    Transfers Overall transfer level: Needs assistance Equipment used: Rolling walker (2 wheeled) Transfers: Sit to/from Stand Sit to Stand: Min assist;From elevated surface         General transfer comment: min assist to support trunk to power up over weak and painful right leg.  verbal cues for safe hand placement. Pt able to stand from elevated bed and elevated BSC.   Ambulation/Gait Ambulation/Gait assistance: Min assist Gait Distance (Feet): 45 Feet Assistive device: Rolling walker (2 wheeled) Gait Pattern/deviations: Step-to  pattern;Antalgic     General Gait Details: Pt with moderately antalgic flexed knee gait pattern, seemingly staring out with touch down weight bearing progressing to her PWB 50% and foot flat pattern.  Min assist to support trunk when WB on right leg.           Balance Overall balance assessment: Needs assistance Sitting-balance support: Feet supported;No upper extremity supported Sitting balance-Leahy Scale: Good     Standing balance support: Bilateral upper extremity supported Standing balance-Leahy Scale: Poor                              Cognition Arousal/Alertness: Awake/alert Behavior During Therapy: WFL for tasks assessed/performed Overall Cognitive Status: Within Functional Limits for tasks assessed                                        Exercises Total Joint Exercises Ankle Circles/Pumps: AROM;Both;20 reps Quad Sets: AROM;Right;10 reps Towel Squeeze: AROM;Both;10 reps Heel Slides: AAROM;Right;10 reps Goniometric ROM: 10-50        Pertinent Vitals/Pain Pain Assessment: 0-10 Pain Score: 7  Pain Location: right knee Pain Descriptors / Indicators: Aching;Burning Pain Intervention(s): Limited activity within patient's tolerance;Monitored during session;Repositioned;Ice applied           PT Goals (current goals can now be found in the care plan section) Acute Rehab PT Goals Patient Stated Goal: to go to rehab and then go home.  Progress towards PT goals: Progressing toward goals  Frequency    7X/week      PT Plan Current plan remains appropriate       AM-PAC PT "6 Clicks" Daily Activity  Outcome Measure  Difficulty turning over in bed (including adjusting bedclothes, sheets and blankets)?: A Little Difficulty moving from lying on back to sitting on the side of the bed? : A Little Difficulty sitting down on and standing up from a chair with arms (e.g., wheelchair, bedside commode, etc,.)?: A Little Help needed moving  to and from a bed to chair (including a wheelchair)?: A Little Help needed walking in hospital room?: A Little Help needed climbing 3-5 steps with a railing? : A Lot 6 Click Score: 17    End of Session   Activity Tolerance: Patient limited by pain Patient left: in chair;with call bell/phone within reach   PT Visit Diagnosis: Muscle weakness (generalized) (M62.81);Difficulty in walking, not elsewhere classified (R26.2);Pain Pain - Right/Left: Right Pain - part of body: Knee     Time: 1101-1137 PT Time Calculation (min) (ACUTE ONLY): 36 min  Charges:  $Gait Training: 8-22 mins $Therapeutic Exercise: 8-22 mins          Brettney Ficken B. Abbigayle Toole, PT, DPT [phone]            01/04/2018, 11:53 AM

## 2018-01-04 NOTE — Clinical Social Work Note (Signed)
Clinical Social Work Assessment  Patient Details  Name: Lauren Frye MRN: [id number] Date of Birth: 21-Sep-1950  Date of referral:  01/04/18               Reason for consult:  Facility Placement                Permission sought to share information with:  Chartered certified accountant granted to share information::  Yes, Verbal Permission Granted  Name::        Agency::  [facility]  Relationship::     Contact Information:     Housing/Transportation Living arrangements for the past 2 months:  Single Family Home Source of Information:  Patient Patient Interpreter Needed:  None Criminal Activity/Legal Involvement Pertinent to Current Situation/Hospitalization:  No - Comment as needed Significant Relationships:  Friend Lives with:  Self Do you feel safe going back to the place where you live?  No Need for family participation in patient care:  No (Coment)  Care giving concerns:  Pt from home alone.  Social Worker assessment / plan:  CSW met with patient at bedside to discuss SNF placement. Pt is a Ortho Bundle and has pre-arranged for SNF placement by ortho office. CSW discussed SNF process and placement. CSW confirmed SNF choice of [facility]. CSW will f/u with SNF as they will need to obtain Insurance Auth. Pt voiced understanding.   CSW will f/u for disposition.  Employment status:  Unemployed Forensic scientist:  Other (Comment Required)(BCBS) PT Recommendations:  [place] / Referral to community resources:  [place]  Patient/Family's Response to care:  Economist.  Patient/Family's Understanding of and Emotional Response to Diagnosis, Current Treatment, and Prognosis:  Pt has good understanding of diagnosis and appears to be in good emotional state with disposition. Pt desires to go to [facility] and return home afterwards. Pt confirmed that she resided alone and ambulated independently prior to  hospitalization. No issues or concerns.   Emotional Assessment Appearance:  Appears stated age Attitude/Demeanor/Rapport:  (Cooperative) Affect (typically observed):  Accepting, Appropriate Orientation:  Oriented to Self, Oriented to Place, Oriented to  Time, Oriented to Situation Alcohol / Substance use:  Not Applicable Psych involvement (Current and /or in the community):  No (Comment)  Discharge Needs  Concerns to be addressed:  Discharge Planning Concerns Readmission within the last 30 days:  No Current discharge risk:  Physical Impairment, Lives alone Barriers to Discharge:  [place], LCSW 01/04/2018, 1:30 PM

## 2018-01-04 NOTE — Social Work (Signed)
CSW f/u on disposition.  [facility] confirmed bed offer and will [employer] Insurance Auth.  CSW will continue to follow.  Keene BreathPatricia Tessie Ordaz, LCSW Clinical Social Worker [phone]

## 2018-01-04 NOTE — NC FL2 (Signed)
MEDICAID FL2 LEVEL OF CARE SCREENING TOOL     IDENTIFICATION  Patient Name: Lauren Frye Birthdate: 12/25/1950 Sex: female Admission Date (Current Location): 01/03/2018  [facility] and [place] Number:  Producer, television/film/video and Address:  [facility], [address] [place], [place] [address]      Provider Number: [id number]  Attending Physician Name and Address:  Valeria Batman, MD  Relative Name and Phone Number:  Chesley Noon, friend, [phone]    Current Level of Care: Hospital Recommended Level of Care: Skilled Nursing Facility Prior Approval Number:    Date Approved/Denied:   PASRR Number: [id number]  Discharge Plan: SNF    Current Diagnoses: Patient Active Problem List   Diagnosis Date Noted  . Primary localized osteoarthritis of right knee 01/03/2018  . Spinal stenosis of lumbar region 05/30/2017  . Postoperative anemia due to acute blood loss 10/11/2015  . Arthritis 10/11/2015  . Constipation 10/11/2015  . Headache 10/11/2015  . Osteoarthritis of left knee 10/07/2015  . Obesity 10/07/2015  . S/P total knee replacement using cement 10/07/2015    Orientation RESPIRATION BLADDER Height & Weight     Self, Time, Situation, Place  Normal Continent Weight:   Height:     BEHAVIORAL SYMPTOMS/MOOD NEUROLOGICAL BOWEL NUTRITION STATUS      Continent Diet(See DC Summary)  AMBULATORY STATUS COMMUNICATION OF NEEDS Skin   Limited Assist Verbally Surgical wounds                       Personal Care Assistance Level of Assistance  Bathing, Feeding, Dressing Bathing Assistance: Limited assistance Feeding assistance: Limited assistance Dressing Assistance: Limited assistance     Functional Limitations Info  Sight, Hearing, Speech Sight Info: Adequate Hearing Info: Adequate Speech Info: Adequate    SPECIAL CARE FACTORS FREQUENCY  PT (By licensed PT), OT (By licensed OT)     PT Frequency: 5x week OT Frequency: 5x  week            Contractures Contractures Info: Not present    Additional Factors Info  Code Status, Allergies Code Status Info: Full Allergies Info: CODEINE, PENICILLINS, PROPOXYPHENE, PREDNISONE            Current Medications (01/04/2018):  This is the current hospital active medication list Current Facility-Administered Medications  Medication Dose Route Frequency Provider Last Rate Last Dose  . acetaminophen (TYLENOL) tablet 325-650 mg  325-650 mg Oral Q6H PRN Cristie Hem, PA-C      . alum & mag hydroxide-simeth (MAALOX/MYLANTA) 200-200-20 MG/5ML suspension 30 mL  30 mL Oral Q4H PRN Cristie Hem, PA-C      . aspirin chewable tablet 81 mg  81 mg Oral BID Cristie Hem, PA-C   81 mg at 01/04/18 5956  . bisacodyl (DULCOLAX) EC tablet 5 mg  5 mg Oral Daily PRN Cristie Hem, PA-C      . diphenhydrAMINE (BENADRYL) 12.5 MG/5ML elixir 12.5-25 mg  12.5-25 mg Oral Q4H PRN Cristie Hem, PA-C      . docusate sodium (COLACE) capsule 100 mg  100 mg Oral BID Jari Sportsman L, PA-C   100 mg at 01/04/18 3875  . HYDROmorphone (DILAUDID) injection 0.5-1 mg  0.5-1 mg Intravenous Q4H PRN Jari Sportsman L, PA-C   1 mg at 01/04/18 1149  . HYDROmorphone (DILAUDID) tablet 2-4 mg  2-4 mg Oral Q4H PRN Jari Sportsman L, PA-C      . magnesium citrate  solution 1 Bottle  1 Bottle Oral Once PRN Cristie HemStanbery, Mary L, PA-C      . menthol-cetylpyridinium (CEPACOL) lozenge 3 mg  1 lozenge Oral PRN Cristie HemStanbery, Mary L, PA-C       Or  . phenol (CHLORASEPTIC) mouth spray 1 spray  1 spray Mouth/Throat PRN Cristie HemStanbery, Mary L, PA-C      . methocarbamol (ROBAXIN) tablet 500 mg  500 mg Oral Q6H PRN Jari SportsmanStanbery, Mary L, PA-C   500 mg at 01/03/18 1425   Or  . methocarbamol (ROBAXIN) 500 mg in dextrose 5 % 50 mL IVPB  500 mg Intravenous Q6H PRN Cristie HemStanbery, Mary L, PA-C      . metoCLOPramide (REGLAN) tablet 5-10 mg  5-10 mg Oral Q8H PRN Cristie HemStanbery, Mary L, PA-C       Or  . metoCLOPramide (REGLAN) injection 5-10 mg  5-10  mg Intravenous Q8H PRN Jari SportsmanStanbery, Mary L, PA-C   10 mg at 01/03/18 2129  . ondansetron (ZOFRAN) tablet 4 mg  4 mg Oral Q6H PRN Jari SportsmanStanbery, Mary L, PA-C   4 mg at 01/03/18 1854   Or  . ondansetron (ZOFRAN) injection 4 mg  4 mg Intravenous Q6H PRN Cristie HemStanbery, Mary L, PA-C      . pantoprazole (PROTONIX) EC tablet 40 mg  40 mg Oral Daily Jari SportsmanStanbery, Mary L, PA-C   40 mg at 01/04/18 [id number]  . polyethylene glycol (MIRALAX / GLYCOLAX) packet 17 g  17 g Oral Daily PRN Jari SportsmanStanbery, Mary L, PA-C      . zolpidem (AMBIEN) tablet 5 mg  5 mg Oral QHS PRN Cristie HemStanbery, Mary L, PA-C         Discharge Medications: Please see discharge summary for a list of discharge medications.  Relevant Imaging Results:  Relevant Lab Results:   Additional Information SS#: [id number]  Tresa MoorePatricia V Harleigh Civello, LCSW

## 2018-01-05 DIAGNOSIS — M1711 Unilateral primary osteoarthritis, right knee: Secondary | ICD-10-CM | POA: Diagnosis not present

## 2018-01-05 LAB — BASIC METABOLIC PANEL
Anion gap: 3 — ABNORMAL LOW (ref 5–15)
BUN: 10 mg/dL (ref 8–23)
CHLORIDE: 103 mmol/L (ref 98–111)
CO2: 30 mmol/L (ref 22–32)
Calcium: 8.3 mg/dL — ABNORMAL LOW (ref 8.9–10.3)
Creatinine, Ser: 0.75 mg/dL (ref 0.44–1.00)
GFR calc Af Amer: >60 mL/min (ref >60)
GFR calc non Af Amer: >60 mL/min (ref >60)
GLUCOSE: 128 mg/dL — AB (ref 70–99)
POTASSIUM: 3.9 mmol/L (ref 3.5–5.1)
Sodium: 136 mmol/L (ref 135–145)

## 2018-01-05 LAB — CBC
HCT: 34.4 % — ABNORMAL LOW (ref 36.0–46.0)
HEMOGLOBIN: 11.1 g/dL — AB (ref 12.0–15.0)
MCH: 29.8 pg (ref 26.0–34.0)
MCHC: 32.3 g/dL (ref 30.0–36.0)
MCV: 92.5 fL (ref 78.0–100.0)
Platelets: 223 10*3/uL (ref 150–400)
RBC: 3.72 MIL/uL — AB (ref 3.87–5.11)
RDW: 13.1 % (ref 11.5–15.5)
WBC: 10.9 10*3/uL — ABNORMAL HIGH (ref 4.0–10.5)

## 2018-01-05 NOTE — Social Work (Signed)
CSW f/u for disposition.  SNF started Insurance Auth yesterday, [employer] is closed today and SNF shld receive Development worker, international aidnsurance Auth tomorrow.  Pt cannot discharge today as Firefighternsurance Auth pending.  Keene BreathPatricia Daijanae Rafalski, LCSW Clinical Social Worker [phone]

## 2018-01-05 NOTE — Progress Notes (Signed)
Subjective: 2 Days Post-Op Procedure(s) (LRB): RIGHT TOTAL KNEE ARTHROPLASTY (Right) Patient reports pain as moderate.  Working well with therapy.  Social Work assisting with short-term SNF placement.  Objective: Vital signs in last 24 hours: Temp:  [98.3 F (36.8 C)-98.7 F (37.1 C)] 98.4 F (36.9 C) (07/04 0352) Pulse Rate:  [83-120] 120 (07/04 0352) Resp:  [18] 18 (07/04 0352) BP: (108-126)/(70-78) 126/78 (07/04 0352) SpO2:  [96 %-98 %] 96 % (07/04 0352)  Intake/Output from previous day: No intake/output data recorded. Intake/Output this shift: No intake/output data recorded.  Recent Labs    01/04/18 0459 01/05/18 0507  HGB 10.7* 11.1*   Recent Labs    01/04/18 0459 01/05/18 0507  WBC 9.3 10.9*  RBC 3.58* 3.72*  HCT 32.9* 34.4*  PLT 210 223   Recent Labs    01/04/18 0459 01/05/18 0507  NA 134* 136  K 4.1 3.9  CL 103 103  CO2 27 30  BUN 17 10  CREATININE 0.93 0.75  GLUCOSE 106* 128*  CALCIUM 7.9* 8.3*   No results for input(s): LABPT, INR in the last 72 hours.  Sensation intact distally Intact pulses distally Dorsiflexion/Plantar flexion intact Incision: dressing C/D/I No cellulitis present Compartment soft  Anticipated LOS equal to or greater than 2 midnights due to - Age 67 and older with one or more of the following:  - Obesity  - Expected need for hospital services (PT, OT, Nursing) required for safe  discharge  - Anticipated need for postoperative skilled nursing care or inpatient rehab  - Active co-morbidities: None OR   - Unanticipated findings during/Post Surgery: None  - Patient is a high risk of re-admission due to: None   Assessment/Plan: 2 Days Post-Op Procedure(s) (LRB): RIGHT TOTAL KNEE ARTHROPLASTY (Right) Up with therapy Discharge to SNF when bed available - possibly today if facility excepting patients on a holiday.    Kathryne HitchChristopher Y Cleva Camero 01/05/2018, 8:34 AM

## 2018-01-05 NOTE — Progress Notes (Signed)
Physical Therapy Treatment Patient Details Name: Lauren Frye MRN: [id number] DOB: 16-Feb-1951 Today's Date: 01/05/2018    History of Present Illness 67 y.o. female admitted 01/03/18 for elective R TKA.  Pt with significant PMH of HA, L TKA (10/2015), and ankle fusion.    PT Comments    Pt performed gait training and functional mobility with progression of exercises.  On arrival patient resting with yellow foam under her R knee.  Educated to keep objects out from under her knee.  She reports she understands but not likely she will comply.  Pt refusing use of bone foam.  Pt remains to require min assistance and continues to benefit from rehab at SNF to improve strength and function before d/c home to her cat.      Follow Up Recommendations  Follow surgeon's recommendation for DC plan and follow-up therapies;Supervision for mobility/OOB     Equipment Recommendations  None recommended by PT    Recommendations for Other Services       Precautions / Restrictions Precautions Precautions: Knee Precaution Booklet Issued: Yes (comment) Precaution Comments: reviewed knee precautions as patient resting with yellow foam under her knee for support and comfort.  Educated this was not best for healing, she reports she understands but im almost certain she will place it under her thigh after therapist leaves the room.  Pt is refusing bone foam.   Restrictions Weight Bearing Restrictions: Yes RLE Weight Bearing: Partial weight bearing RLE Partial Weight Bearing Percentage or Pounds: 50%    Mobility  Bed Mobility               General bed mobility comments: Pt in recliner on arrival.    Transfers Overall transfer level: Needs assistance Equipment used: Rolling walker (2 wheeled) Transfers: Sit to/from Stand Sit to Stand: Min assist         General transfer comment: Cues for hand placement to and from seated surface.  min assist to boost into standing.  Remains to presents with poorly  controlled descent to seated surface.    Ambulation/Gait Ambulation/Gait assistance: Min assist Gait Distance (Feet): 120 Feet Assistive device: Rolling walker (2 wheeled) Gait Pattern/deviations: Step-to pattern;Antalgic     General Gait Details: Moderately antlagic gait pattern, cues for at least foot flat contact while still maintaining 50% PWB, but I did not want her to walk PF and make her R gastroc/soleus complex tight.    Stairs             Wheelchair Mobility    Modified Rankin (Stroke Patients Only)       Balance Overall balance assessment: Needs assistance Sitting-balance support: Feet supported;No upper extremity supported Sitting balance-Leahy Scale: Good     Standing balance support: Bilateral upper extremity supported Standing balance-Leahy Scale: Poor                              Cognition Arousal/Alertness: Awake/alert Behavior During Therapy: WFL for tasks assessed/performed Overall Cognitive Status: Within Functional Limits for tasks assessed                                        Exercises Total Joint Exercises Ankle Circles/Pumps: AROM;Both;20 reps;Supine Quad Sets: AROM;Right;10 reps;Supine Heel Slides: AAROM;Right;10 reps;Supine Long Arc Quad: Right;10 reps;AAROM;Seated Goniometric ROM: Pt grossly 70 degrees flexion in R knee.     General  Comments        Pertinent Vitals/Pain Pain Assessment: 0-10 Pain Score: 7  Pain Location: right knee Pain Descriptors / Indicators: Aching;Grimacing;Guarding Pain Intervention(s): Monitored during session;Repositioned;Ice applied    Home Living                      Prior Function            PT Goals (current goals can now be found in the care plan section) Acute Rehab PT Goals Patient Stated Goal: to go to rehab and then go home.  Potential to Achieve Goals: Good Progress towards PT goals: Progressing toward goals    Frequency    7X/week       PT Plan Current plan remains appropriate    Co-evaluation              AM-PAC PT "6 Clicks" Daily Activity  Outcome Measure  Difficulty turning over in bed (including adjusting bedclothes, sheets and blankets)?: A Little Difficulty moving from lying on back to sitting on the side of the bed? : A Little Difficulty sitting down on and standing up from a chair with arms (e.g., wheelchair, bedside commode, etc,.)?: A Little Help needed moving to and from a bed to chair (including a wheelchair)?: A Little Help needed walking in hospital room?: A Little Help needed climbing 3-5 steps with a railing? : A Lot 6 Click Score: 17    End of Session Equipment Utilized During Treatment: Gait belt Activity Tolerance: Patient limited by pain Patient left: in chair;with call bell/phone within reach;with family/visitor present Nurse Communication: Mobility status PT Visit Diagnosis: Muscle weakness (generalized) (M62.81);Difficulty in walking, not elsewhere classified (R26.2);Pain Pain - Right/Left: Right Pain - part of body: Knee     Time: [id number] PT Time Calculation (min) (ACUTE ONLY): 26 min  Charges:  $Gait Training: 8-22 mins $Therapeutic Exercise: 8-22 mins                    G Codes:       Joycelyn RuaAimee Lunetta Marina, PTA pager [phone]    Florestine Aversimee J Wilmar Prabhakar 01/05/2018, 11:33 AM

## 2018-01-05 NOTE — Progress Notes (Signed)
Orthopedic Tech Progress Note Patient Details:  Benson SettingSusan J Mies 03-Jul-1951 [id number]  CPM Right Knee CPM Right Knee: On Right Knee Flexion (Degrees): 90 Right Knee Extension (Degrees): 0 Additional Comments: (patient placed back in CPM per request)  Post Interventions Patient Tolerated: Well Instructions Provided: Care of device  Saul FordyceJennifer C Justen Fonda 01/05/2018, 6:51 PM

## 2018-01-05 NOTE — Discharge Summary (Addendum)
Patient ID: Lauren Frye MRN: [id number] DOB/AGE: [birth year] 67 y.o.  Admit date: 01/03/2018 Discharge date: 01/05/2018  Admission Diagnoses:  Active Problems:   Primary localized osteoarthritis of right knee   Discharge Diagnoses:  Same  Past Medical History:  Diagnosis Date  . Arthritis   . Depression    denies  . Headache    migraines    Surgeries: Procedure(s): RIGHT TOTAL KNEE ARTHROPLASTY on 01/03/2018   Consultants:   Discharged Condition: Improved  Hospital Course: Lauren SettingSusan J Frye is an 67 y.o. female who was admitted 01/03/2018 for operative treatment of<principal problem not specified>. Patient has severe unremitting pain that affects sleep, daily activities, and work/hobbies. After pre-op clearance the patient was taken to the operating room on 01/03/2018 and underwent  Procedure(s): RIGHT TOTAL KNEE ARTHROPLASTY.    Patient was given perioperative antibiotics:  Anti-infectives (From admission, onward)   Start     Dose/Rate Route Frequency Ordered Stop   01/03/18 1600  clindamycin (CLEOCIN) IVPB 600 mg     600 mg 100 mL/hr over 30 Minutes Intravenous Every 6 hours 01/03/18 1457 01/03/18 2205   01/03/18 0600  vancomycin (VANCOCIN) 1,500 mg in sodium chloride 0.9 % 500 mL IVPB     1,500 mg 250 mL/hr over 120 Minutes Intravenous To ShortStay Surgical 01/02/18 1356 01/03/18 0832       Patient was given sequential compression devices, early ambulation, and chemoprophylaxis to prevent DVT.  Patient benefited maximally from hospital stay and there were no complications.    Recent vital signs:  Patient Vitals for the past 24 hrs:  BP Temp Temp src Pulse Resp SpO2  01/05/18 0352 126/78 98.4 F (36.9 C) Oral (!) 120 18 96 %  01/04/18 2335 112/70 98.7 F (37.1 C) Oral 99 18 98 %  01/04/18 2007 121/70 98.3 F (36.8 C) Oral 92 18 98 %  01/04/18 1219 108/72 98.7 F (37.1 C) Oral 83 18 96 %     Recent laboratory studies:  Recent Labs    01/04/18 0459  01/05/18 0507  WBC 9.3 10.9*  HGB 10.7* 11.1*  HCT 32.9* 34.4*  PLT 210 223  NA 134* 136  K 4.1 3.9  CL 103 103  CO2 27 30  BUN 17 10  CREATININE 0.93 0.75  GLUCOSE 106* 128*  CALCIUM 7.9* 8.3*     Discharge Medications:   Allergies as of 01/05/2018      Reactions   Codeine Anaphylaxis, Hives, Itching, Swelling   Possible swelling in throat   Penicillins Anaphylaxis   Has patient had a PCN reaction causing immediate rash, facial/tongue/throat swelling, SOB or lightheadedness with hypotension: Unknown Has patient had a PCN reaction causing severe rash involving mucus membranes or skin necrosis: Unknown Has patient had a PCN reaction that required hospitalization: Unknown--treated in ER Has patient had a PCN reaction occurring within the last 10 years: No CHILDHOOD REACTION. If all of the above answers are "NO", then may proceed with Cephalosporin use.   Propoxyphene Hives, Itching, Swelling   POSSIBLE swelling in throat   Prednisone Swelling   Undefined as to specifics      Medication List    STOP taking these medications   BIOTIN PO   ibuprofen 800 MG tablet Commonly known as:  ADVIL,MOTRIN   naproxen sodium 220 MG tablet Commonly known as:  ALEVE     TAKE these medications   aspirin EC 81 MG tablet Take one tab po bid x 4 weeks post-op to prevent blood clots  HYDROmorphone 2 MG tablet Commonly known as:  DILAUDID Take 1-2 tabs po q4-6 hours prn pain   methocarbamol 500 MG tablet Commonly known as:  ROBAXIN Take one tab po tid prn muscle spasm What changed:  additional instructions   PERFECT IRON 25 MG Tabs Generic drug:  Carbonyl Iron Take 25 mg by mouth daily. SOFT IRON   PROBIOTIC ADVANCED PO Take 3 capsules by mouth daily.   STRESS TAB NF PO Take 1 tablet by mouth daily.       Diagnostic Studies: Dg Chest 2 View  Result Date: 12/27/2017 CLINICAL DATA:  Follow-up possible atelectasis on preoperative study of December 23, 2017. EXAM: CHEST - 2  VIEW COMPARISON:  PA and lateral chest x-ray dated December 23, 2017 FINDINGS: The lungs are well-expanded. There is no focal infiltrate. There is no pleural effusion. No pulmonary parenchymal nodules or masses are observed. The heart and pulmonary vascularity are normal. The mediastinum is normal in width. There is no acute bony abnormality. IMPRESSION: No pulmonary parenchymal nodules or areas of atelectasis are observed. There is no acute cardiopulmonary abnormality. Electronically Signed   By: David  [place] M.D.   On: 12/27/2017 10:17   Dg Chest 2 View  Result Date: 12/23/2017 CLINICAL DATA:  Patient admitted for right knee replacement. EXAM: CHEST - 2 VIEW COMPARISON:  09/24/2015. FINDINGS: Mediastinum hilar structures are normal. Questionable nodular opacity in the right lung base. This may represent a small area of atelectasis. Repeat PA and lateral chest x-ray suggested. If this nodular density remains nonenhanced chest CT can be obtained. No acute cardiopulmonary disease otherwise noted. No pleural effusion pneumothorax. Degenerative change thoracic spine. IMPRESSION: 1. Nodular density noted projected over the right lung base. This may represent a small area of atelectasis. Repeat PA lateral chest x-ray suggested. This nodular density remains nonenhanced chest CT can be obtained. 2.  No acute cardiopulmonary disease otherwise noted. Electronically Signed   By: Maisie Fus  Register   On: 12/23/2017 08:51   Dg Knee Right Port  Result Date: 01/03/2018 CLINICAL DATA:  Status post right total knee joint prosthesis placement. EXAM: PORTABLE RIGHT KNEE - 1-2 VIEW COMPARISON:  MRI of the right knee of March 26, 2009 FINDINGS: The patient has undergone total right knee joint prosthesis placement. Radiographic positioning of the prosthetic components is good. The interface with the native bone is normal. A small amount of air in fluid is present anteriorly in the joint space. Skin staples are present. IMPRESSION:  No immediate postprocedure complication following right total knee joint prosthesis placement. Electronically Signed   By: David  [place] M.D.   On: 01/03/2018 10:30    Disposition: Discharge disposition: 03-Skilled Nursing Facility          Contact information for follow-up providers    Valeria Batman, MD In 2 weeks.   Specialty:  Orthopedic Surgery Contact information: [address] [place] [address] [phone]            Contact information for after-discharge care    Destination    [facility] .   Service:  Skilled Nursing Contact information: [address] [place] [address] [phone]                   Signed: Kathryne Hitch 01/05/2018, 8:36 AM   Per Dr. Cleophas Dunker and Jacqualine Code, PA patient is okay to transfer to skilled nursing facility.

## 2018-01-06 ENCOUNTER — Non-Acute Institutional Stay (SKILLED_NURSING_FACILITY): Payer: BLUE CROSS/BLUE SHIELD | Admitting: Internal Medicine

## 2018-01-06 ENCOUNTER — Encounter: Payer: Self-pay | Admitting: Internal Medicine

## 2018-01-06 ENCOUNTER — Telehealth (INDEPENDENT_AMBULATORY_CARE_PROVIDER_SITE_OTHER): Payer: Self-pay | Admitting: Orthopaedic Surgery

## 2018-01-06 DIAGNOSIS — Z96651 Presence of right artificial knee joint: Secondary | ICD-10-CM

## 2018-01-06 DIAGNOSIS — M1711 Unilateral primary osteoarthritis, right knee: Secondary | ICD-10-CM

## 2018-01-06 LAB — CBC
HCT: 31.6 % — ABNORMAL LOW (ref 36.0–46.0)
Hemoglobin: 10.3 g/dL — ABNORMAL LOW (ref 12.0–15.0)
MCH: 30.2 pg (ref 26.0–34.0)
MCHC: 32.6 g/dL (ref 30.0–36.0)
MCV: 92.7 fL (ref 78.0–100.0)
Platelets: 214 10*3/uL (ref 150–400)
RBC: 3.41 MIL/uL — AB (ref 3.87–5.11)
RDW: 13.2 % (ref 11.5–15.5)
WBC: 9.3 10*3/uL (ref 4.0–10.5)

## 2018-01-06 LAB — BASIC METABOLIC PANEL
ANION GAP: 6 (ref 5–15)
BUN: 10 mg/dL (ref 8–23)
CALCIUM: 8.3 mg/dL — AB (ref 8.9–10.3)
CO2: 30 mmol/L (ref 22–32)
Chloride: 103 mmol/L (ref 98–111)
Creatinine, Ser: 0.78 mg/dL (ref 0.44–1.00)
GFR calc Af Amer: >60 mL/min (ref >60)
GLUCOSE: 109 mg/dL — AB (ref 70–99)
POTASSIUM: 3.7 mmol/L (ref 3.5–5.1)
SODIUM: 139 mmol/L (ref 135–145)

## 2018-01-06 NOTE — Telephone Encounter (Signed)
Notified Lauren Frye about orders, she states they will need you to  Fax the order into their office

## 2018-01-06 NOTE — Progress Notes (Signed)
:   Location:  [facility] Room Number: [phone] Place of Service:  SNF (31)   Randon Goldsmithnne D. Lyn HollingsheadAlexander, MD  PCP: Patient, No Pcp Per Patient Care Team: Patient, No Pcp Per as PCP - General (General Practice)  Extended Emergency Contact Information Primary Emergency Contact: Farris HasJohnson,Jenny          [place], [place] [address] [place] Home Phone: [phone] Relation: Friend     Allergies: Codeine; Penicillins; Propoxyphene; and Prednisone  Chief Complaint  Patient presents with  . New Admit To SNF    [facility]mit to [facility]    HPI: Patient is 67 y.o. female with osteoarthritis of right knee who [facility] 7[facility]-4 for total knee arthroplasty.  Complications.  Patient is made to skilled nursing facility for OT/PT.  Patient has no chronic medical problems for which she takes medications.  Past Medical History:  Diagnosis Date  . Arthritis   . Depression    denies  . Headache    migraines    Past Surgical History:  Procedure Laterality Date  . ABDOMINAL HYSTERECTOMY    . ANKLE FUSION    . BOWEL RESECTION    . KNEE ARTHROSCOPY    . KNEE SURGERY Bilateral    LEFT ARTHROCOPY  X3    ,  RT X1  . TONSILLECTOMY    . TOTAL KNEE ARTHROPLASTY Left 10/07/2015  . TOTAL KNEE ARTHROPLASTY Left 10/07/2015   Procedure: TOTAL KNEE ARTHROPLASTY;  Surgeon: Valeria BatmanPeter W Whitfield, MD;  [facility];  Service: Orthopedics;  Laterality: Left;  . TOTAL KNEE ARTHROPLASTY Right 01/03/2018  . TOTAL KNEE ARTHROPLASTY Right 01/03/2018   Procedure: RIGHT TOTAL KNEE ARTHROPLASTY;  Surgeon: Valeria BatmanWhitfield, Peter W, MD;  [facility];  Service: Orthopedics;  Laterality: Right;    Allergies as of 01/06/2018      Reactions   Codeine Anaphylaxis, Hives, Itching, Swelling   Possible swelling in throat   Penicillins Anaphylaxis   Has patient had a PCN reaction causing immediate rash, facial/tongue/throat swelling, SOB or lightheadedness with hypotension:  Unknown Has patient had a PCN reaction causing severe rash involving mucus membranes or skin necrosis: Unknown Has patient had a PCN reaction that required hospitalization: Unknown--treated in ER Has patient had a PCN reaction occurring within the last 10 years: No CHILDHOOD REACTION. If all of the above answers are "NO", then may proceed with Cephalosporin use.   Propoxyphene Hives, Itching, Swelling   POSSIBLE swelling in throat   Prednisone Swelling   Undefined as to specifics      Medication List        Accurate as of 01/06/18  4:08 PM. Always use your most recent med list.          aspirin EC 81 MG tablet Take one tab po bid x 4 weeks post-op to prevent blood clots   HYDROmorphone 2 MG tablet Commonly known as:  DILAUDID Take 1-2 tabs po q4-6 hours prn pain   methocarbamol 500 MG tablet Commonly known as:  ROBAXIN Take one tab po tid prn muscle spasm   PERFECT IRON 25 MG Tabs Generic drug:  Carbonyl Iron Take 25 mg by mouth daily. SOFT IRON   PROBIOTIC ADVANCED PO Take 3 capsules by mouth daily.   STRESS TAB NF PO Take 1 tablet by mouth daily.       No orders of the defined types were placed in this encounter.    There is  no immunization history on file for this patient.  Social History   Tobacco Use  . Smoking status: Never Smoker  . Smokeless tobacco: Never Used  Substance Use Topics  . Alcohol use: Yes    Alcohol/week: 0.6 oz    Types: 1 Glasses of wine per week    Comment: ONCE A week    Family history is   Family History  Problem Relation Age of Onset  . Parkinson's disease Mother   . COPD Mother   . Diabetes Sister       Review of Systems  DATA OBTAINED: from patient, nurse GENERAL:  no fevers, fatigue, appetite changes SKIN: No itching, or rash EYES: No eye pain, redness, discharge EARS: No earache, tinnitus, change in hearing NOSE: No congestion, drainage or bleeding  MOUTH/THROAT: No mouth or tooth pain, No sore  throat RESPIRATORY: No cough, wheezing, SOB CARDIAC: No chest pain, palpitations, lower extremity edema  GI: No abdominal pain, No N/V/D or constipation, No heartburn or reflux  GU: No dysuria, frequency or urgency, or incontinence  MUSCULOSKELETAL: No unrelieved bone/joint pain NEUROLOGIC: No headache, dizziness or focal weakness PSYCHIATRIC: No c/o anxiety or sadness   There were no vitals filed for this visit.  SpO2 Readings from Last 1 Encounters:  01/06/18 100%   There is no height or weight on file to calculate BMI.     Physical Exam  GENERAL APPEARANCE: Alert, conversant,  No acute distress.  SKIN: Incision area right knee looks good, no redness minimal swelling to extremity HEAD: Normocephalic, atraumatic  EYES: Conjunctiva/lids clear. Pupils round, reactive. EOMs intact.  EARS: External exam WNL, canals clear. Hearing grossly normal.  NOSE: No deformity or discharge.  MOUTH/THROAT: Lips w/o lesions  RESPIRATORY: Breathing is even, unlabored. Lung sounds are clear   CARDIOVASCULAR: Heart RRR no murmurs, rubs or gallops. No peripheral edema.   GASTROINTESTINAL: Abdomen is soft, non-tender, not distended w/ normal bowel sounds. GENITOURINARY: Bladder non tender, not distended  MUSCULOSKELETAL: No abnormal joints or musculature NEUROLOGIC:  Cranial nerves 2-12 grossly intact. Moves all extremities  PSYCHIATRIC: Mood and affect appropriate to situation, no behavioral issues  Patient Active Problem List   Diagnosis Date Noted  . Primary localized osteoarthritis of right knee 01/03/2018  . Spinal stenosis of lumbar region 05/30/2017  . Postoperative anemia due to acute blood loss 10/11/2015  . Arthritis 10/11/2015  . Constipation 10/11/2015  . Headache 10/11/2015  . Osteoarthritis of left knee 10/07/2015  . Obesity 10/07/2015  . S/P total knee replacement using cement 10/07/2015      Labs reviewed: Basic Metabolic Panel:    Component Value Date/Time   NA 139  01/06/2018 0428   NA 139 10/15/2015   K 3.7 01/06/2018 0428   CL 103 01/06/2018 0428   CO2 30 01/06/2018 0428   GLUCOSE 109 (H) 01/06/2018 0428   BUN 10 01/06/2018 0428   BUN 16 10/15/2015   CREATININE 0.78 01/06/2018 0428   CALCIUM 8.3 (L) 01/06/2018 0428   PROT 6.4 (L) 12/23/2017 0822   ALBUMIN 3.9 12/23/2017 0822   AST 21 12/23/2017 0822   ALT 21 12/23/2017 0822   ALKPHOS 74 12/23/2017 0822   BILITOT 0.5 12/23/2017 0822   GFRNONAA >60 01/06/2018 0428   GFRAA >60 01/06/2018 0428    Recent Labs    01/04/18 0459 01/05/18 0507 01/06/18 0428  NA 134* 136 139  K 4.1 3.9 3.7  CL 103 103 103  CO2 27 30 30   GLUCOSE 106* 128* 109*  BUN 17 10 10   CREATININE 0.93 0.75 0.78  CALCIUM 7.9* 8.3* 8.3*   Liver Function Tests: Recent Labs    12/23/17 0822  AST 21  ALT 21  ALKPHOS 74  BILITOT 0.5  PROT 6.4*  ALBUMIN 3.9   No results for input(s): LIPASE, AMYLASE in the last 8760 hours. No results for input(s): AMMONIA in the last 8760 hours. CBC: Recent Labs    04/28/17 1453  01/04/18 0459 01/05/18 0507 01/06/18 0428  WBC 8.3   < > 9.3 10.9* 9.3  NEUTROABS 4,905  --   --   --   --   HGB 13.4   < > 10.7* 11.1* 10.3*  HCT 39.1   < > 32.9* 34.4* 31.6*  MCV 88.3   < > 91.9 92.5 92.7  PLT 274   < > 210 223 214   < > = values in this interval not displayed.   Lipid No results for input(s): CHOL, HDL, LDLCALC, TRIG in the last 8760 hours.  Cardiac Enzymes: No results for input(s): CKTOTAL, CKMB, CKMBINDEX, TROPONINI in the last 8760 hours. BNP: No results for input(s): BNP in the last 8760 hours. No results found for: MICROALBUR No results found for: HGBA1C No results found for: TSH No results found for: VITAMINB12 No results found for: FOLATE No results found for: IRON, TIBC, FERRITIN  Imaging and Procedures obtained prior to SNF admission: Dg Chest 2 View  Result Date: 12/27/2017 CLINICAL DATA:  Follow-up possible atelectasis on preoperative study of December 23, 2017. EXAM: CHEST - 2 VIEW COMPARISON:  PA and lateral chest x-ray dated December 23, 2017 FINDINGS: The lungs are well-expanded. There is no focal infiltrate. There is no pleural effusion. No pulmonary parenchymal nodules or masses are observed. The heart and pulmonary vascularity are normal. The mediastinum is normal in width. There is no acute bony abnormality. IMPRESSION: No pulmonary parenchymal nodules or areas of atelectasis are observed. There is no acute cardiopulmonary abnormality. Electronically Signed   By: David  [place] M.D.   On: 12/27/2017 10:17     Not all labs, radiology exams or other studies done during hospitalization come through on my EPIC note; however they are reviewed by me.    Assessment and Plan  Right knee osteoarthritis/right knee arthroplasty SNF -patient is being prophylaxed with aspirin 81 mg twice daily for 4 weeks; this is patient's second arthroplasty of knee and she uses Dilaudid for pain, 1 mg 3 times daily scheduled and will schedule Robaxin as well   Time spent greater than 35 minutes;> 50% of time with patient was spent reviewing records, labs, tests and studies, counseling and developing plan of care  Thurston Hole D. Lyn Hollingshead, MD

## 2018-01-06 NOTE — Progress Notes (Signed)
Subjective: 3 Days Post-Op Procedure(s) (LRB): RIGHT TOTAL KNEE ARTHROPLASTY (Right) Patient reports pain as mild.    Objective: Vital signs in last 24 hours: Temp:  [98.3 F (36.8 C)] 98.3 F (36.8 C) (07/05 0500) Pulse Rate:  [88-104] 88 (07/05 0500) Resp:  [17-18] 17 (07/05 0500) BP: (109-121)/(73-75) 109/73 (07/05 0500) SpO2:  [97 %-99 %] 97 % (07/05 0500)  Intake/Output from previous day: 07/04 0701 - 07/05 0700 In: 700 [P.O.:700] Out: -  Intake/Output this shift: No intake/output data recorded.  Recent Labs    01/04/18 0459 01/05/18 0507 01/06/18 0428  HGB 10.7* 11.1* 10.3*   Recent Labs    01/05/18 0507 01/06/18 0428  WBC 10.9* 9.3  RBC 3.72* 3.41*  HCT 34.4* 31.6*  PLT 223 214   Recent Labs    01/05/18 0507 01/06/18 0428  NA 136 139  K 3.9 3.7  CL 103 103  CO2 30 30  BUN 10 10  CREATININE 0.75 0.78  GLUCOSE 128* 109*  CALCIUM 8.3* 8.3*   No results for input(s): LABPT, INR in the last 72 hours.  Neurologically intact ABD soft Compartment soft    Assessment/Plan: 3 Days Post-Op Procedure(s) (LRB): RIGHT TOTAL KNEE ARTHROPLASTY (Right) Discharge to SNF. Good effort in PT, voiding without difficulty, no related SOB or nausea-discharge to rehab today    Valeria Batmaneter W Maythe Deramo 01/06/2018, 7:18 AM

## 2018-01-06 NOTE — Telephone Encounter (Signed)
Lauren Frye from [facility] left a voicemail stating that Lauren Frye is here for short term rehab and we didn't see a CPM in her  discharge summary.   Patient states that according to Dr. Cleophas DunkerWhitfield she is suppose to be using the CPM 6 hours/day.  Please call our office to clarify those orders. [phone]

## 2018-01-06 NOTE — Social Work (Signed)
CSW f/u on disposition.  SNF has obtained [employer] Auth.  CSW will f/u.  Keene BreathPatricia Roniel Halloran, LCSW Clinical Social Worker [phone]

## 2018-01-06 NOTE — Social Work (Signed)
Clinical Social Worker facilitated patient discharge including contacting patient family and facility to confirm patient discharge plans.  Clinical information faxed to facility and family agreeable with plan.    CSW arranged ambulance transport via [facility] to [facility].    RN to call [phone] to give report prior to discharge.  Clinical Social Worker will sign off for now as social work intervention is no longer needed. Please consult us again if new need arises.  Tequila Rottmann, LCSW Clinical Social Worker [phone]    

## 2018-01-06 NOTE — Clinical Social Work Placement (Addendum)
   CLINICAL SOCIAL WORK PLACEMENT  NOTE  Date:  01/06/2018  Patient Details  Name: Lauren Frye MRN: [id number] Date of Birth: August 06, 1950  Clinical Social Work is seeking post-discharge placement for this patient at the Skilled  Nursing Facility level of care (*CSW will initial, date and re-position this form in  chart as items are completed):  Yes   Patient/family provided with [facility] Clinical Social Work Department's list of facilities offering this level of care within the geographic area requested by the patient (or if unable, by the patient's family).  Yes   Patient/family informed of their freedom to choose among providers that offer the needed level of care, that participate in Medicare, Medicaid or managed care program needed by the patient, have an available bed and are willing to accept the patient.  Yes   Patient/family informed of [facility]'s ownership interest in [facility] and [facility], as well as of the fact that they are under no obligation to receive care at these facilities.  PASRR submitted to EDS on       PASRR number received on       Existing PASRR number confirmed on 01/04/18     FL2 transmitted to all facilities in geographic area requested by pt/family on       FL2 transmitted to all facilities within larger geographic area on 01/04/18     Patient informed that his/her managed care company has contracts with or will negotiate with certain facilities, including the following:        Yes   Patient/family informed of bed offers received.  Patient chooses bed at [facility]     Physician recommends and patient chooses bed at      Patient to be transferred to [facility] on 01/06/18.  Patient to be transferred to facility by [facility]     Patient family notified on 01/06/18 of transfer.  Name of family member notified:  pt responsible for self     PHYSICIAN       Additional Comment:     _______________________________________________ Tresa MoorePatricia V Douglas Smolinsky, LCSW 01/06/2018, 8:54 AM

## 2018-01-06 NOTE — Discharge Planning (Signed)
Report to [facility] attempted x1

## 2018-01-06 NOTE — Progress Notes (Signed)
Physical Therapy Treatment Patient Details Name: Lauren Frye MRN: [id number] DOB: 11-Jun-1951 Today's Date: 01/06/2018    History of Present Illness 67 y.o. female admitted 01/03/18 for elective R TKA.  Pt with significant PMH of HA, L TKA (10/2015), and ankle fusion.    PT Comments    Pt resting in supine on arrival requesting to toilet. Pt remains to require min guard for safety with bed mobility and transfers.  Plan for d/c to [facility] at 1200 with [facility].  POst gait training assisted patient back into bathroom to bathe and dress to get ready for d/c.  Nursing informed of patient placement post gait training.    Follow Up Recommendations  Follow surgeon's recommendation for DC plan and follow-up therapies;Supervision for mobility/OOB     Equipment Recommendations  None recommended by PT    Recommendations for Other Services       Precautions / Restrictions Precautions Precautions: Knee Precaution Booklet Issued: Yes (comment) Restrictions Weight Bearing Restrictions: Yes RLE Weight Bearing: Partial weight bearing RLE Partial Weight Bearing Percentage or Pounds: 50%    Mobility  Bed Mobility Overal bed mobility: Needs Assistance Bed Mobility: Supine to Sit     Supine to sit: Min guard     General bed mobility comments: Cues for foot placement to use non-surgical limb to lift surgical limb.  Pt required cues for hand placement to adavance to edge of bed.  Min guard for safety.    Transfers Overall transfer level: Needs assistance Equipment used: Rolling walker (2 wheeled) Transfers: Sit to/from Stand Sit to Stand: Min guard         General transfer comment: Cues for hand placement to and from seated surface.    Ambulation/Gait Ambulation/Gait assistance: Min guard Gait Distance (Feet): 120 Feet Assistive device: Rolling walker (2 wheeled) Gait Pattern/deviations: Antalgic;Trunk flexed;Step-to pattern     General Gait Details: Cues to maintain step to  pattern and cues for [facility] safety.  Pt is slow and guarded with flexed posture.     Stairs             Wheelchair Mobility    Modified Rankin (Stroke Patients Only)       Balance Overall balance assessment: Needs assistance   Sitting balance-Leahy Scale: Good       Standing balance-Leahy Scale: Poor Standing balance comment: standing at sink washing hands needs min guard assist for balance.                             Cognition Arousal/Alertness: Awake/alert Behavior During Therapy: WFL for tasks assessed/performed Overall Cognitive Status: Within Functional Limits for tasks assessed                                        Exercises Total Joint Exercises Goniometric ROM: Pt just removed from CPM 0-95 degrees.      General Comments        Pertinent Vitals/Pain Pain Assessment: 0-10 Faces Pain Scale: Hurts even more Pain Location: right knee Pain Descriptors / Indicators: Aching;Grimacing;Guarding Pain Intervention(s): Monitored during session;Repositioned    Home Living                      Prior Function            PT Goals (current goals can now be found  in the care plan section) Acute Rehab PT Goals Patient Stated Goal: to go to rehab and then go home.  Potential to Achieve Goals: Good Progress towards PT goals: Progressing toward goals    Frequency    7X/week      PT Plan Current plan remains appropriate    Co-evaluation              AM-PAC PT "6 Clicks" Daily Activity  Outcome Measure  Difficulty turning over in bed (including adjusting bedclothes, sheets and blankets)?: A Little Difficulty moving from lying on back to sitting on the side of the bed? : A Little Difficulty sitting down on and standing up from a chair with arms (e.g., wheelchair, bedside commode, etc,.)?: A Little Help needed moving to and from a bed to chair (including a wheelchair)?: A Little Help needed walking in hospital  room?: A Little   6 Click Score: 15    End of Session Equipment Utilized During Treatment: Gait belt Activity Tolerance: Patient limited by pain Patient left: in chair;with call bell/phone within reach;with family/visitor present Nurse Communication: Mobility status PT Visit Diagnosis: Muscle weakness (generalized) (M62.81);Difficulty in walking, not elsewhere classified (R26.2);Pain Pain - Right/Left: Right Pain - part of body: Knee     Time: 1052-1110 PT Time Calculation (min) (ACUTE ONLY): 18 min  Charges:  $Gait Training: 8-22 mins                    G Codes:       Joycelyn Rua, PTA pager [phone]    Florestine Avers 01/06/2018, 11:20 AM

## 2018-01-08 ENCOUNTER — Encounter: Payer: Self-pay | Admitting: Internal Medicine

## 2018-01-08 DIAGNOSIS — Z96651 Presence of right artificial knee joint: Secondary | ICD-10-CM | POA: Insufficient documentation

## 2018-01-09 ENCOUNTER — Telehealth (INDEPENDENT_AMBULATORY_CARE_PROVIDER_SITE_OTHER): Payer: Self-pay | Admitting: Orthopaedic Surgery

## 2018-01-09 NOTE — Telephone Encounter (Signed)
PLEASE ADVISE.

## 2018-01-09 NOTE — Telephone Encounter (Signed)
Remind me to fax the order

## 2018-01-09 NOTE — Telephone Encounter (Signed)
Called re above-resolved

## 2018-01-09 NOTE — Telephone Encounter (Signed)
SENT FAX INTO [phone] TO [facility]

## 2018-01-09 NOTE — Telephone Encounter (Signed)
Patient called stating she is at [facility] in [place] receiving rehab and states she is only getting pain medication 1/2 pill every 8 hours.  Patient states she needs more medication especially when she has to go to physical therapy.  Patient is requesting that Dr. Cleophas DunkerWhitfield call [facility] BandyNikki in registration for a new prescription for pain medication.  Their [phone]

## 2018-01-11 LAB — BASIC METABOLIC PANEL
BUN: 16 (ref 4–21)
CREATININE: 0.6 (ref 0.5–1.1)
GLUCOSE: 110
Potassium: 4.7 (ref 3.4–5.3)
Sodium: 139 (ref 137–147)

## 2018-01-11 LAB — CBC AND DIFFERENTIAL
HEMATOCRIT: 33 — AB (ref 36–46)
Hemoglobin: 10.8 — AB (ref 12.0–16.0)
Platelets: 394 (ref 150–399)
WBC: 9

## 2018-01-16 ENCOUNTER — Ambulatory Visit (INDEPENDENT_AMBULATORY_CARE_PROVIDER_SITE_OTHER): Payer: BLUE CROSS/BLUE SHIELD

## 2018-01-16 ENCOUNTER — Encounter (INDEPENDENT_AMBULATORY_CARE_PROVIDER_SITE_OTHER): Payer: Self-pay | Admitting: Orthopaedic Surgery

## 2018-01-16 ENCOUNTER — Ambulatory Visit (INDEPENDENT_AMBULATORY_CARE_PROVIDER_SITE_OTHER): Payer: BLUE CROSS/BLUE SHIELD | Admitting: Orthopaedic Surgery

## 2018-01-16 ENCOUNTER — Other Ambulatory Visit (INDEPENDENT_AMBULATORY_CARE_PROVIDER_SITE_OTHER): Payer: Self-pay | Admitting: Radiology

## 2018-01-16 VITALS — BP 125/78 | HR 90 | Ht 66.0 in | Wt 220.0 lb

## 2018-01-16 DIAGNOSIS — M25561 Pain in right knee: Secondary | ICD-10-CM

## 2018-01-16 DIAGNOSIS — G8929 Other chronic pain: Secondary | ICD-10-CM

## 2018-01-16 DIAGNOSIS — Z96651 Presence of right artificial knee joint: Secondary | ICD-10-CM

## 2018-01-16 DIAGNOSIS — M25562 Pain in left knee: Principal | ICD-10-CM

## 2018-01-16 NOTE — Progress Notes (Signed)
Office Visit Note   Patient: Lauren Frye           Date of Birth: 11/27/1950           MRN: [id number] Visit Date: 01/16/2018              Requested by: No referring provider defined for this encounter. PCP: Patient, No Pcp Per   Assessment & Plan: Visit Diagnoses:  1. Chronic pain of right knee   2. History of total right knee replacement     Plan: 2 weeks status post primary right total knee replacement and doing well.  Pres[facility] in [facility] at [facility].  No obvious complications.  Stitches removed and Steri-Strips applied.  Continue with physical therapy.  Reevaluate in 2 weeks.  Weightbearing as tolerated  Follow-Up Instructions: Return in about 2 weeks (around 01/30/2018).   Orders:  Orders Placed This Encounter  Procedures  . XR KNEE 3 VIEW RIGHT   No orders of the defined types were placed in this encounter.     Procedures: No procedures performed   Clinical Data: No additional findings.   Subjective: Chief Complaint  Patient presents with  . Follow-up    01/03/18 R TKA  HAVING SOME PAIN AND SWELLING, USING ICE AND MEDS AND ELEVATION  No related fever or chills or calf pain  HPI  Review of Systems  Constitutional: Positive for fatigue. Negative for fever.  HENT: Negative for ear pain.   Eyes: Negative for pain.  Respiratory: Negative for cough and shortness of breath.   Cardiovascular: Positive for leg swelling.  Gastrointestinal: Positive for constipation. Negative for diarrhea.  Genitourinary: Negative for difficulty urinating.  Musculoskeletal: Positive for back pain. Negative for neck pain.  Skin: Negative for rash.  Allergic/Immunologic: Negative for food allergies.  Neurological: Positive for weakness and numbness.  Hematological: Bruises/bleeds easily.  Psychiatric/Behavioral: Positive for sleep disturbance.     Objective: Vital Signs: BP 125/78 (BP Location: Left Arm, Patient Position: Sitting, Cuff Size: Normal)   Pulse 90    Ht 5\' 6"  (1.676 m)   Wt 220 lb (99.8 kg)   BMI 35.51 kg/m   Physical Exam  Ortho Exam awake alert and oriented x3.  Comfortable sitting.  Right knee incision is healing without complications.  Staples removed and Steri-Strips applied.  Minimal ankle edema.  No calf pain.  Full knee extension and about 95 degrees of flexion.  No instability.  Skin intact.  Specialty Comments:  No specialty comments available.  Imaging: Xr Knee 3 View Right  Result Date: 01/16/2018 As of the right total knee replacement reveal excellent position of the femoral and tibial components with a good glue mantle.  No obvious complications.  There is slight lateral position of the patella on the patella view but patient is asymptomatic    PMFS History: Patient Active Problem List   Diagnosis Date Noted  . S/P total knee arthroplasty, right 01/08/2018  . Primary localized osteoarthritis of right knee 01/03/2018  . Spinal stenosis of lumbar region 05/30/2017  . Postoperative anemia due to acute blood loss 10/11/2015  . Arthritis 10/11/2015  . Constipation 10/11/2015  . Headache 10/11/2015  . Osteoarthritis of left knee 10/07/2015  . Obesity 10/07/2015  . 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 Arthritis   . Depression    denies  . Headache    migraines    Family History  Problem Relation Age of  Onset  . Parkinson's disease Mother   . COPD Mother   . Diabetes Sister     Past Surgical History:  Procedure Laterality Date  . ABDOMINAL HYSTERECTOMY    . ANKLE FUSION    . BOWEL RESECTION    . KNEE ARTHROSCOPY    . KNEE SURGERY Bilateral    LEFT ARTHROCOPY  X3    ,  RT X1  . TONSILLECTOMY    . TOTAL KNEE ARTHROPLASTY Left 10/07/2015  . TOTAL KNEE ARTHROPLASTY Left 10/07/2015   Procedure: TOTAL KNEE ARTHROPLASTY;  Surgeon: Valeria Batman, MD;  Location: [facility] OR;  Service: Orthopedics;  Laterality: Left;  . TOTAL KNEE ARTHROPLASTY Right  01/03/2018  . TOTAL KNEE ARTHROPLASTY Right 01/03/2018   Procedure: RIGHT TOTAL KNEE ARTHROPLASTY;  Surgeon: Valeria Batman, MD;  Location: [facility] OR;  Service: Orthopedics;  Laterality: Right;   Social History   Occupational History  . Not on file  Tobacco Use  . Smoking status: Never Smoker  . Smokeless tobacco: Never Used  Substance and Sexual Activity  . Alcohol use: Yes    Alcohol/week: 0.6 oz    Types: 1 Glasses of wine per week    Comment: ONCE A week  . Drug use: No  . Sexual activity: Not Currently    Birth control/protection: Abstinence

## 2018-01-19 ENCOUNTER — Other Ambulatory Visit (INDEPENDENT_AMBULATORY_CARE_PROVIDER_SITE_OTHER): Payer: Self-pay | Admitting: Orthopedic Surgery

## 2018-01-23 ENCOUNTER — Encounter: Payer: Self-pay | Admitting: Internal Medicine

## 2018-01-23 ENCOUNTER — Non-Acute Institutional Stay (SKILLED_NURSING_FACILITY): Payer: BLUE CROSS/BLUE SHIELD | Admitting: Internal Medicine

## 2018-01-23 DIAGNOSIS — Z96651 Presence of right artificial knee joint: Secondary | ICD-10-CM | POA: Diagnosis not present

## 2018-01-23 DIAGNOSIS — M1711 Unilateral primary osteoarthritis, right knee: Secondary | ICD-10-CM

## 2018-01-23 NOTE — Progress Notes (Signed)
Location:  [facility] Room Number: [phone] Place of Service:  SNF [phone])  Randon Goldsmith. Lyn Hollingshead, MD  Patient Care Team: Patient, No Pcp Per as PCP - General (General Practice)  Extended Emergency Contact Information Primary Emergency Contact: Farris Has, [place] [address] [place] of [place] Home Phone: [phone] Relation: Friend  Allergies  Allergen Reactions  . Codeine Anaphylaxis, Hives, Itching and Swelling    Possible swelling in throat  . Penicillins Anaphylaxis    Has patient had a PCN reaction causing immediate rash, facial/tongue/throat swelling, SOB or lightheadedness with hypotension: Unknown Has patient had a PCN reaction causing severe rash involving mucus membranes or skin necrosis: Unknown Has patient had a PCN reaction that required hospitalization: Unknown--treated in ER Has patient had a PCN reaction occurring within the last 10 years: No CHILDHOOD REACTION. If all of the above answers are "NO", then may proceed with Cephalosporin use.   Marland Kitchen Propoxyphene Hives, Itching and Swelling    POSSIBLE swelling in throat  . Prednisone Swelling    Undefined as to specifics    Chief Complaint  Patient presents with  . Discharge Note    Discharge from [facility]     HPI:  67 y.o. female with osteoarthritis of the right knee who was admitted to [facility] from 7/2-4 for total knee arthroplasty. there were no complications.  Is admitted to skilled nursing facility for OT/PT and is now ready to be discharged to home.    Past Medical History:  Diagnosis Date  . Arthritis   . Depression    denies  . Headache    migraines    Past Surgical History:  Procedure Laterality Date  . ABDOMINAL HYSTERECTOMY    . ANKLE FUSION    . BOWEL RESECTION    . KNEE ARTHROSCOPY    . KNEE SURGERY Bilateral    LEFT ARTHROCOPY  X3    ,  RT X1  . TONSILLECTOMY    . TOTAL KNEE ARTHROPLASTY Left 10/07/2015  . TOTAL KNEE  ARTHROPLASTY Left 10/07/2015   Procedure: TOTAL KNEE ARTHROPLASTY;  Surgeon: Valeria Batman, MD;  Location[facility] OR;  Service: Orthopedics;  Laterality: Left;  . TOTAL KNEE ARTHROPLASTY Right 01/03/2018  . TOTAL KNEE ARTHROPLASTY Right 01/03/2018   Procedure: RIGHT TOTAL KNEE ARTHROPLASTY;  Surgeon: Valeria Batman, MD;  Location: [facility] OR;  Service: Orthopedics;  Laterality: Right;     reports that she has never smoked. She has never used smokeless tobacco. She reports that she drinks about 0.6 oz of alcohol per week. She reports that she does not use drugs. Social History   Socioeconomic History  . Marital status: Widowed    Spouse name: Not on file  . Number of children: Not on file  . Years of education: Not on file  . Highest education level: Not on file  Occupational History  . Not on file  Social Needs  . Financial resource strain: Not on file  . Food insecurity:    Worry: Not on file    Inability: Not on file  . Transportation needs:    Medical: Not on file    Non-medical: Not on file  Tobacco Use  . Smoking status: Never Smoker  . Smokeless tobacco: Never Used  Substance and Sexual Activity  . Alcohol use: Yes    Alcohol/week: 0.6 oz    Types: 1 Glasses of wine per week  Comment: ONCE A week  . Drug use: No  . Sexual activity: Not Currently    Birth control/protection: Abstinence  Lifestyle  . Physical activity:    Days per week: Not on file    Minutes per session: Not on file  . Stress: Not on file  Relationships  . Social connections:    Talks on phone: Not on file    Gets together: Not on file    Attends religious service: Not on file    Active member of club or organization: Not on file    Attends meetings of clubs or organizations: Not on file    Relationship status: Not on file  . Intimate partner violence:    Fear of current or ex partner: Not on file    Emotionally abused: Not on file    Physically abused: Not on file    Forced sexual activity:  Not on file  Other Topics Concern  . Not on file  Social History Narrative  . Not on file    Pertinent  Health Maintenance Due  Topic Date Due  . INFLUENZA VACCINE  02/02/2018  . DEXA SCAN  02/23/2018 (Originally 05/16/2016)  . PNA vac Low Risk Adult (1 of 2 - PCV13) 02/23/2018 (Originally 05/16/2016)  . MAMMOGRAM  03/26/2018 (Originally 10/01/2011)  . COLONOSCOPY  03/26/2018 (Originally 05/16/2001)    Medications: Allergies as of 01/23/2018      Reactions   Codeine Anaphylaxis, Hives, Itching, Swelling   Possible swelling in throat   Penicillins Anaphylaxis   Has patient had a PCN reaction causing immediate rash, facial/tongue/throat swelling, SOB or lightheadedness with hypotension: Unknown Has patient had a PCN reaction causing severe rash involving mucus membranes or skin necrosis: Unknown Has patient had a PCN reaction that required hospitalization: Unknown--treated in ER Has patient had a PCN reaction occurring within the last 10 years: No CHILDHOOD REACTION. If all of the above answers are "NO", then may proceed with Cephalosporin use.   Propoxyphene Hives, Itching, Swelling   POSSIBLE swelling in throat   Prednisone Swelling   Undefined as to specifics      Medication List        Accurate as of 01/23/18 11:59 PM. Always use your most recent med list.          aspirin EC 81 MG tablet Take 81 mg by mouth 2 (two) times daily. Stop date 8/2   FERROUS GLUCONATE IRON PO Take 240 mg by mouth daily.   HYDROmorphone 2 MG tablet Commonly known as:  DILAUDID Take 1-2 tabs po q4-6 hours prn pain   methocarbamol 500 MG tablet Commonly known as:  ROBAXIN Take one tab po tid prn muscle spasm   PROBIOTIC ADVANCED PO Take 3 capsules by mouth daily.   STRESS TAB NF PO Take 1 tablet by mouth daily.        Vitals:   01/23/18 1147  BP: 121/79  Pulse: 66  Resp: 18  Temp: 97.8 F (36.6 C)  Weight: 220 lb (99.8 kg)  Height: 5\' 5"  (1.651 m)   Body mass index is  36.61 kg/m.  Physical Exam  GENERAL APPEARANCE: Alert, conversant. No acute distress.  HEENT: Unremarkable. RESPIRATORY: Breathing is even, unlabored. Lung sounds are clear   CARDIOVASCULAR: Heart RRR no murmurs, rubs or gallops. No peripheral edema.  GASTROINTESTINAL: Abdomen is soft, non-tender, not distended w/ normal bowel sounds.  NEUROLOGIC: Cranial nerves 2-12 grossly intact. Moves all extremities   Labs reviewed: Basic Metabolic Panel: Recent  Labs    01/04/18 0459 01/05/18 0507 01/06/18 0428 01/11/18  NA 134* 136 139 139  K 4.1 3.9 3.7 4.7  CL 103 103 103  --   CO2 27 30 30   --   GLUCOSE 106* 128* 109*  --   BUN 17 10 10 16   CREATININE 0.93 0.75 0.78 0.6  CALCIUM 7.9* 8.3* 8.3*  --    No results found for: [facility] Liver Function Tests: Recent Labs    12/23/17 0822  AST 21  ALT 21  ALKPHOS 74  BILITOT 0.5  PROT 6.4*  ALBUMIN 3.9   No results for input(s): LIPASE, AMYLASE in the last 8760 hours. No results for input(s): AMMONIA in the last 8760 hours. CBC: Recent Labs    04/28/17 1453  01/04/18 0459 01/05/18 0507 01/06/18 0428 01/11/18  WBC 8.3   < > 9.3 10.9* 9.3 9.0  NEUTROABS 4,905  --   --   --   --   --   HGB 13.4   < > 10.7* 11.1* 10.3* 10.8*  HCT 39.1   < > 32.9* 34.4* 31.6* 33*  MCV 88.3   < > 91.9 92.5 92.7  --   PLT 274   < > 210 223 214 394   < > = values in this interval not displayed.   Lipid No results for input(s): CHOL, HDL, LDLCALC, TRIG in the last 8760 hours. Cardiac Enzymes: No results for input(s): CKTOTAL, CKMB, CKMBINDEX, TROPONINI in the last 8760 hours. BNP: No results for input(s): BNP in the last 8760 hours. CBG: No results for input(s): GLUCAP in the last 8760 hours.  Procedures and Imaging Studies During Stay: Xr Knee 3 View Right  Result Date: 01/16/2018 As of the right total knee replacement reveal excellent position of the femoral and tibial components with a good glue mantle.  No obvious complications.   There is slight lateral position of the patella on the patella view but patient is asymptomatic   Assessment/Plan:   Primary localized osteoarthritis of right knee  S/P total knee arthroplasty, right   Patient is being discharged with the following home health services:  Outpatient with [place] outpatient rehab added to med [facility]  Patient is being discharged with the following durable medical equipment: None  Patient has been advised to f/u with their PCP in 1-2 weeks to bring them up to date on their rehab stay.  Social services at facility was responsible for arranging this appointment.  Pt was provided with a 30 day supply of prescriptions for medications and refills must be obtained from their PCP.  For controlled substances, a more limited supply may be provided adequate until PCP appointment only.  Medications have been reconciled Spent greater than 30 minutes;> 50% of time with patient was spent reviewing records, labs, tests and studies, counseling and developing plan of care  Randon Goldsmith. Lyn Hollingshead, MD

## 2018-01-25 ENCOUNTER — Other Ambulatory Visit: Payer: Self-pay

## 2018-01-25 ENCOUNTER — Encounter: Payer: Self-pay | Admitting: Physical Therapy

## 2018-01-25 ENCOUNTER — Ambulatory Visit: Payer: BLUE CROSS/BLUE SHIELD | Attending: Orthopaedic Surgery | Admitting: Physical Therapy

## 2018-01-25 DIAGNOSIS — R2689 Other abnormalities of gait and mobility: Secondary | ICD-10-CM

## 2018-01-25 DIAGNOSIS — M25661 Stiffness of right knee, not elsewhere classified: Secondary | ICD-10-CM | POA: Diagnosis present

## 2018-01-25 DIAGNOSIS — M25561 Pain in right knee: Secondary | ICD-10-CM | POA: Diagnosis not present

## 2018-01-25 NOTE — Therapy (Addendum)
[facility] Outpatient Rehabilitation [facility] [address]  [address] [place], [place], [address] Phone: [phone]   Fax:  [phone]  Physical Therapy Evaluation  Patient Details  Name: Lauren Frye MRN: [id number] Date of Birth: 12-20-1950 Referring Provider: [name], MD   Encounter Date: 01/25/2018  PT End of Session - 01/25/18 1122    Visit Number  1    Number of Visits  18    Date for PT Re-Evaluation  03/22/18    PT Start Time  1018    PT Stop Time  1119    PT Time Calculation (min)  61 min    Activity Tolerance  Patient limited by pain    Behavior During Therapy  [facility] for tasks assessed/performed       Past Medical History:  Diagnosis Date  . Arthritis   . Depression    denies  . Headache    migraines    Past Surgical History:  Procedure Laterality Date  . ABDOMINAL HYSTERECTOMY    . ANKLE FUSION    . BOWEL RESECTION    . KNEE ARTHROSCOPY    . KNEE SURGERY Bilateral    LEFT ARTHROCOPY  X3    ,  RT X1  . TONSILLECTOMY    . TOTAL KNEE ARTHROPLASTY Left 10/07/2015  . TOTAL KNEE ARTHROPLASTY Left 10/07/2015   Procedure: TOTAL KNEE ARTHROPLASTY;  Surgeon: Valeria Batman, MD;  Location: [facility] OR;  Service: Orthopedics;  Laterality: Left;  . TOTAL KNEE ARTHROPLASTY Right 01/03/2018  . TOTAL KNEE ARTHROPLASTY Right 01/03/2018   Procedure: RIGHT TOTAL KNEE ARTHROPLASTY;  Surgeon: Valeria Batman, MD;  Location: [facility] OR;  Service: Orthopedics;  Laterality: Right;    There were no vitals filed for this visit.   Subjective Assessment - 01/25/18 1030    Subjective  Pt reports that before R  TKA on 01/03/2018 she has had previous debridement surgery in the R knee. Has had a L TKA in April of 2017. Pt works a job where she is mostly required to sit at a desk but gets up intermittently to go to [employer]. Pt enjoys walking and gardening and is unable to do these activities now like she was before. Pt experiences some numbness on the  lateral portion of the joint line and the fibular head but says she experiences this still in the L knee after that TKA.  Since surgery pt has been receiving physical therapy at a SNF until she returned home on 01/24/2018 and was given exercises to continue at home.     Pertinent History  L TKA (10/2015)    Limitations  Sitting;Standing;House hold activities    How long can you sit comfortably?  5 min    How long can you stand comfortably?  5 min    Patient Stated Goals  To keep the knee moving    Currently in Pain?  Yes    Pain Score  4  7/8 without pain medication    Pain Location  Knee    Pain Orientation  Right    Pain Descriptors / Indicators  Throbbing    Pain Type  Acute pain    Pain Onset  1 to 4 weeks ago    Aggravating Factors   Sitting, standing    Pain Relieving Factors  Ice, Medication         [facility] PT Assessment - 01/25/18 0001      Assessment   Medical Diagnosis  Chronic  Pain of L knee    Referring Provider  [name], MD    Onset Date/Surgical Date  01/03/18    Next MD Visit  01/27/2018    Prior Therapy  Yes      Balance Screen   Has the patient fallen in the past 6 months  Yes    How many times?  1    Has the patient had a decrease in activity level because of a fear of falling?   Yes    Is the patient reluctant to leave their home because of a fear of falling?   No      Home Environment   Living Environment  Private residence    Living Arrangements  Alone    Type of Home  House    Home Access  Stairs to enter    Entrance Stairs-Number of Steps  5 to enter front door, 2 in the garage    Entrance Stairs-Rails  Right;Left    Home Layout  One level    Home Equipment  [place] - single point;Shower seat;Walker - 2 wheels      Prior Function   Level of Independence  Independent    Vocation  Full time employment    Vocation Requirements  Mostly sitting at a desk, getting up/down to go to the [employer], going to the gym 3x/week       Observation/Other Assessments   Focus on Therapeutic Outcomes (FOTO)   Intake - Status 43% (limitation 57%); Predicted - 54% (Limitation 46%)      ROM / Strength   AROM / PROM / Strength  AROM      AROM   AROM Assessment Site  Knee;Lumbar    Right/Left Knee  Right;Left    Right Knee Extension  9    Right Knee Flexion  101    Left Knee Extension  1    Left Knee Flexion  103    Lumbar Flexion  WNL      Palpation   Patella mobility  Restricted in all directions secondary to scar tissue and edema    Palpation comment  TTP along gastroc/hamstring musculature, joint line and length of scar.       Ambulation/Gait   Ambulation/Gait  Yes    Ambulation/Gait Assistance  6: Modified independent (Device/Increase time)    Ambulation Distance (Feet)  35 Feet    Assistive device  Straight cane    Gait Pattern  Decreased step length - left;Decreased stance time - right;Decreased hip/knee flexion - right;Decreased weight shift to right;Antalgic;Lateral trunk lean to left    Ambulation Surface  Indoor;Level                Objective measurements completed on examination: See above findings.      [facility] Adult PT Treatment/Exercise - 01/25/18 0001      Exercises   Exercises  Knee/Hip;Ankle;Lumbar      Knee/Hip Exercises: Stretches   Knee: Self-Stretch to increase Flexion  3 reps;30 seconds;Right    Knee: Self-Stretch Limitations  To pt tolerance    Other Knee/Hip Stretches  Sitting with hip flexed to 90 degrees and knee in as much extension at tolerated, foot supported on chair in front of patient. Actively contracting quadriceps x 10 reps with 2 second hold       Modalities   Modalities  Vasopneumatic      Vasopneumatic   Number Minutes Vasopneumatic   15 minutes  Vasopnuematic Location   Knee    Vasopneumatic Pressure  Low    Vasopneumatic Temperature   Lowest      Manual Therapy   Manual Therapy  Joint mobilization    Manual therapy comments  Patella mobilization to break  up scar tissue and improve mobility and patellar tracking    Joint Mobilization  Instruced pt in how to perform self-patellar mobilizations              PT Education - 01/25/18 1121    Education Details  Pt educated on results of examination, HEP exercises and POC.     Person(s) Educated  Patient    Methods  Explanation;Demonstration;Handout    Comprehension  Verbalized understanding;Returned demonstration       PT Short Term Goals - 01/25/18 1139      PT SHORT TERM GOAL #1   Title  Pt will be independent with initial HEP     Status  New    Target Date  02/22/18      PT SHORT TERM GOAL #2   Title  Pt will ambulate with [facility]     Status  New    Target Date  02/22/18        PT Long Term Goals - 01/25/18 1139      PT LONG TERM GOAL #1   Title  Pt will be independent with advanced HEP program     Status  New    Target Date  03/22/18      PT LONG TERM GOAL #2   Title  Pt will demonstrate R knee AROM 0-120 or greater    Status  New    Target Date  03/22/18      PT LONG TERM GOAL #3   Title  Pt will report pain no greater than 4/10 at worst    Status  New    Target Date  03/22/18      PT LONG TERM GOAL #4   Title  Pt will ascend/descend 1 flight of stairs with normal gait pattern and LRAD to improve functional mobility     Status  New    Target Date  03/22/18      PT LONG TERM GOAL #5   Title  Pt will report abilty to garden with no limitations or increase in pain    Status  New    Target Date  03/22/18             Plan - 01/25/18 1133    Clinical Impression Statement  Pt is a 67 y/o F presenting to OP PT secondary to R TKA on 01/03/2018. Examination revealed antalgic gait, decreased WB on R LE, decreased ROM of the R knee with pain, and  TTP in hamstrings and gastroc musculature and along incision. These impairments are preventing pt from performing ADLs such as walking up/down stairs and being able to sit for long periods of time  which is required of her at work. At this time she is also unable to garden to her PLOF and states she would like to return to this activity. Pt prognosis is positively impacted by previous successful TKA and active lifestyle. Pt will benefit from physical therapy to address these deficits, address her goals, and improve functional mobility.     History and Personal Factors relevant to plan of care:  L TKA 10/2015    Clinical Presentation  Stable    Clinical Decision Making  Low  Rehab Potential  Good    PT Frequency  3x / week 3x/week for 2 weeks, then 2x/week for 6 weeks    PT Duration  8 weeks    PT Treatment/Interventions  ADLs/Self Care Home Management;Cryotherapy;Electrical Stimulation;Moist Heat;Ultrasound;DME Instruction;Gait training;Stair training;Functional mobility training;Therapeutic activities;Therapeutic exercise;Iontophoresis 4mg /ml Dexamethasone;Balance training;Neuromuscular re-education;Patient/family education;Manual techniques;Scar mobilization;Passive range of motion;Dry needling;Taping;Vasopneumatic Device    PT Next Visit Plan  Assess LE strength and establish a goal     Consulted and Agree with Plan of Care  Patient       Patient will benefit from skilled therapeutic intervention in order to improve the following deficits and impairments:  Abnormal gait, Hypomobility, Impaired sensation, Pain, Difficulty walking, Decreased mobility, Decreased activity tolerance, Decreased range of motion, Decreased skin integrity, Decreased strength, Increased edema, Increased muscle spasms, Impaired flexibility  Visit Diagnosis: Acute pain of right knee  Stiffness of right knee, not elsewhere classified  Other abnormalities of gait and mobility     Problem List Patient Active Problem List   Diagnosis Date Noted  . S/P total knee arthroplasty, right 01/08/2018  . Primary localized osteoarthritis of right knee 01/03/2018  . Spinal stenosis of lumbar region 05/30/2017  .  Postoperative anemia due to acute blood loss 10/11/2015  . Arthritis 10/11/2015  . Constipation 10/11/2015  . Headache 10/11/2015  . Osteoarthritis of left knee 10/07/2015  . Obesity 10/07/2015  . S/P total knee replacement using cement 10/07/2015    Mikal Plane, SPT 01/25/2018, 12:24 PM  [facility] [address]  [address] [place], [place], [address] Phone: [phone]   Fax:  [phone]  Name: Lauren Frye MRN: [id number] Date of Birth: [birth year]

## 2018-01-27 ENCOUNTER — Ambulatory Visit (INDEPENDENT_AMBULATORY_CARE_PROVIDER_SITE_OTHER): Payer: BLUE CROSS/BLUE SHIELD | Admitting: Orthopaedic Surgery

## 2018-01-27 ENCOUNTER — Encounter (INDEPENDENT_AMBULATORY_CARE_PROVIDER_SITE_OTHER): Payer: Self-pay | Admitting: Orthopaedic Surgery

## 2018-01-27 VITALS — BP 111/80 | HR 99 | Ht 66.0 in | Wt 220.0 lb

## 2018-01-27 DIAGNOSIS — Z96651 Presence of right artificial knee joint: Secondary | ICD-10-CM

## 2018-01-27 NOTE — Progress Notes (Signed)
Office Visit Note   Patient: Lauren Frye           Date of Birth: [birth year]           MRN: [id number] Visit Date: 01/27/2018              Requested by: No referring provider defined for this encounter. PCP: Patient, No Pcp Per   Assessment & Plan: Visit Diagnoses:  1. History of total right knee replacement     Plan: Just over 3 weeks status post primary right total knee replacement.  Presently at home having spent time at [facility] rehab.  Using a single-point cane.  Therapy scheduled as outpatient next Tuesday.  Long discussion regarding pain medicines.  May stop aspirin and start ibuprofen.  Office 1 month  Follow-Up Instructions: Return in about 1 month (around 02/27/2018).   Orders:  No orders of the defined types were placed in this encounter.  No orders of the defined types were placed in this encounter.     Procedures: No procedures performed   Clinical Data: No additional findings.   Subjective: Chief Complaint  Patient presents with  . Follow-up    01/03/18 R TKR F/U HAVING PAIN AND SWELLING AND SOME LOW BACK PAIN  Now at home and starting outpatient therapy next Tuesday.  Using a single-point cane.  Doing well with her exercises and range of motion.  Taking Dilaudid for pain.  Denies shortness of breath or chest pain.  No distal edema.  Does have history of chronic low back pain which is compromising her course HPI  Review of Systems  Constitutional: Positive for fatigue. Negative for fever.  HENT: Negative for ear pain.   Eyes: Negative for pain.  Respiratory: Negative for cough and shortness of breath.   Cardiovascular: Positive for leg swelling.  Gastrointestinal: Positive for constipation. Negative for diarrhea.  Genitourinary: Negative for difficulty urinating.  Musculoskeletal: Positive for back pain. Negative for neck pain.  Skin: Negative for rash.  Allergic/Immunologic: Negative for food allergies.  Neurological: Positive for weakness and  numbness.  Hematological: Bruises/bleeds easily.  Psychiatric/Behavioral: Positive for sleep disturbance.     Objective: Vital Signs: Ht 5\' 6"  (1.676 m)   Wt 220 lb (99.8 kg)   BMI 35.51 kg/m   Physical Exam  Ortho Exam awake alert and oriented x3.  Comfortable sitting.  Incision right knee healing without problem.  No calf pain.  No distal edema.  No instability.  Full extension and 90 to 95 degrees of flexion.  Specialty Comments:  No specialty comments available.  Imaging: No results found.   PMFS History: Patient Active Problem List   Diagnosis Date Noted  . S/P total knee arthroplasty, right 01/08/2018  . Primary localized osteoarthritis of right knee 01/03/2018  . Spinal stenosis of lumbar region 05/30/2017  . Postoperative anemia due to acute blood loss 10/11/2015  . Arthritis 10/11/2015  . Constipation 10/11/2015  . Headache 10/11/2015  . Osteoarthritis of left knee 10/07/2015  . Obesity 10/07/2015  . 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 Arthritis   . Depression    denies  . Headache    migraines    Family History  Problem Relation Age of Onset  . Parkinson's disease Mother   . COPD Mother   . Diabetes Sister     Past Surgical History:  Procedure Laterality Date  . ABDOMINAL HYSTERECTOMY    . ANKLE FUSION    .  BOWEL RESECTION    . KNEE ARTHROSCOPY    . KNEE SURGERY Bilateral    LEFT ARTHROCOPY  X3    ,  RT X1  . TONSILLECTOMY    . TOTAL KNEE ARTHROPLASTY Left 10/07/2015  . TOTAL KNEE ARTHROPLASTY Left 10/07/2015   Procedure: TOTAL KNEE ARTHROPLASTY;  Surgeon: Valeria Batman, MD;  Location: [facility] OR;  Service: Orthopedics;  Laterality: Left;  . TOTAL KNEE ARTHROPLASTY Right 01/03/2018  . TOTAL KNEE ARTHROPLASTY Right 01/03/2018   Procedure: RIGHT TOTAL KNEE ARTHROPLASTY;  Surgeon: Valeria Batman, MD;  Location: [facility] OR;  Service: Orthopedics;  Laterality: Right;   Social History    Occupational History  . Not on file  Tobacco Use  . Smoking status: Never Smoker  . Smokeless tobacco: Never Used  Substance and Sexual Activity  . Alcohol use: Yes    Alcohol/week: 0.6 oz    Types: 1 Glasses of wine per week    Comment: ONCE A week  . Drug use: No  . Sexual activity: Not Currently    Birth control/protection: Abstinence

## 2018-01-31 ENCOUNTER — Ambulatory Visit: Payer: BLUE CROSS/BLUE SHIELD | Admitting: Physical Therapy

## 2018-01-31 ENCOUNTER — Encounter: Payer: Self-pay | Admitting: Physical Therapy

## 2018-01-31 DIAGNOSIS — R2689 Other abnormalities of gait and mobility: Secondary | ICD-10-CM

## 2018-01-31 DIAGNOSIS — M25561 Pain in right knee: Secondary | ICD-10-CM

## 2018-01-31 DIAGNOSIS — M25661 Stiffness of right knee, not elsewhere classified: Secondary | ICD-10-CM

## 2018-01-31 NOTE — Therapy (Addendum)
[facility] [address]  [address] [place], [place], [address] Phone: [phone]   Fax:  [phone]  Physical Therapy Treatment  Patient Details  Name: Lauren Frye MRN: [id number] Date of Birth: 11-17-1950 Referring Provider: Norlene CampbellPeter Whitfield, MD   Encounter Date: 01/31/2018  PT End of Session - 01/31/18 1445    Visit Number  2    Number of Visits  18    Date for PT Re-Evaluation  03/22/18    PT Start Time  1358    PT Stop Time  1457    PT Time Calculation (min)  59 min    Activity Tolerance  Patient limited by pain;Patient tolerated treatment well    Behavior During Therapy  [facility] for tasks assessed/performed       Past Medical History:  Diagnosis Date  . Arthritis   . Depression    denies  . Headache    migraines    Past Surgical History:  Procedure Laterality Date  . ABDOMINAL HYSTERECTOMY    . ANKLE FUSION    . BOWEL RESECTION    . KNEE ARTHROSCOPY    . KNEE SURGERY Bilateral    LEFT ARTHROCOPY  X3    ,  RT X1  . TONSILLECTOMY    . TOTAL KNEE ARTHROPLASTY Left 10/07/2015  . TOTAL KNEE ARTHROPLASTY Left 10/07/2015   Procedure: TOTAL KNEE ARTHROPLASTY;  Surgeon: Valeria BatmanPeter W Whitfield, MD;  [facility];  Service: Orthopedics;  Laterality: Left;  . TOTAL KNEE ARTHROPLASTY Right 01/03/2018  . TOTAL KNEE ARTHROPLASTY Right 01/03/2018   Procedure: RIGHT TOTAL KNEE ARTHROPLASTY;  Surgeon: Valeria BatmanWhitfield, Peter W, MD;  [facility];  Service: Orthopedics;  Laterality: Right;    There were no vitals filed for this visit.  Subjective Assessment - 01/31/18 1400    Subjective  Pt reports that she is noticing an increase in ROM and a decrease in pain levels. Has successfully decreased pain medication frequency throughout the day. Continues to have difficulty getting into/out of a car.     Pertinent History  L TKA (10/2015)    Limitations  Sitting;Standing;House hold activities    Patient Stated Goals  To keep the knee  moving    Currently in Pain?  Yes    Pain Score  4     Pain Location  Knee    Pain Orientation  Right    Pain Type  Surgical pain    Aggravating Factors   Bending the knee, getting in/out of a car         [facility] PT Assessment - 01/31/18 0001      Assessment   Referring Provider  Norlene CampbellPeter Whitfield, MD    Onset Date/Surgical Date  01/03/18    Next MD Visit  02/25/2015      ROM / Strength   AROM / PROM / Strength  Strength      AROM   AROM Assessment Site  Knee    Right/Left Knee  Right;Left    Right Knee Extension  4    Right Knee Flexion  98      Strength   Strength Assessment Site  Knee;Hip    Right/Left Hip  Right;Left    Right Hip Flexion  4-/5    Right Hip Extension  4-/5    Right Hip External Rotation   4/5 + for R knee pain    Right Hip Internal Rotation  4/5    Right Hip ABduction  4+/5    Right Hip ADduction  4-/5    Left Hip Flexion  4+/5    Left Hip Extension  4/5    Left Hip External Rotation  4/5    Left Hip Internal Rotation  4+/5    Left Hip ABduction  4+/5    Left Hip ADduction  4+/5    Right/Left Knee  Right;Left    Right Knee Flexion  4+/5    Right Knee Extension  4/5    Left Knee Flexion  5/5    Left Knee Extension  5/5                   [facility] Adult PT Treatment/Exercise - 01/31/18 0001      Lumbar Exercises: Supine   Bridge  10 reps    Bridge Limitations  Modified to stager stance and allow the R knee to be less bent during exercise to prevent pain. VC to shift weight onto L LE to decrease WB through R knee.     Straight Leg Raise  10 reps    Straight Leg Raises Limitations  Right; 2#      Knee/Hip Exercises: Standing   Terminal Knee Extension  10 reps;Right;Strengthening;Theraband    Theraband Level (Terminal Knee Extension)  Level 4 (Blue)    Lateral Step Up  10 reps;Hand Hold: 0;Step Height: 6";Right    Forward Step Up  10 reps;Hand Hold: 0;Right;Step Height: 6"      Knee/Hip Exercises: Seated   Long Arc Quad  10  reps;Strengthening;Right    Long Arc Quad Limitations  Red TB around ankle, anchored under L LE    Hamstring Curl  10 reps;Right;Strengthening    Hamstring Limitations  Red TB around ankle; anchored under L LE positioned anterior to R LE    Sit to [employer]  10 reps;without UE support      Vasopneumatic   Number Minutes Vasopneumatic   15 minutes    Vasopnuematic Location   Knee    Vasopneumatic Pressure  Low    Vasopneumatic Temperature   Lowest      Manual Therapy   Manual Therapy  Other (comment)    Manual therapy comments  Patella/surgical scar mobilization x 3 min. Scar with good mobilty. Mod restricted patellar mobility secondary to inflammation                PT Short Term Goals - 01/31/18 1602      PT SHORT TERM GOAL #1   Title  Pt will be independent with initial HEP     Status  On-going      PT SHORT TERM GOAL #2   Title  Pt will ambulate with normal gait pattern with SPC     Status  On-going        PT Long Term Goals - 01/31/18 1601      PT LONG TERM GOAL #1   Title  Pt will be independent with advanced HEP program     Status  On-going    Target Date  03/22/18      PT LONG TERM GOAL #2   Title  Pt will demonstrate R knee AROM 0-120 or greater    Status  On-going    Target Date  03/22/18      PT LONG TERM GOAL #3   Title  Pt will report pain no greater than 4/10 at worst    Status  On-going    Target Date  03/22/18  PT LONG TERM GOAL #4   Title  Pt will ascend/descend 1 flight of stairs with normal gait pattern and LRAD to improve functional mobility     Status  On-going    Target Date  03/22/18      PT LONG TERM GOAL #5   Title  Pt will report abilty to garden with no limitations or increase in pain    Status  On-going    Target Date  03/22/18      Additional Long Term Goals   Additional Long Term Goals  Yes      PT LONG TERM GOAL #6   Title  Pt will have 5/5 global R LE strength to improve ability to perform functional activities  including climbing stairs    Status  New    Target Date  03/22/18            Plan - 01/31/18 1556    Clinical Impression Statement  Pt tolerated treatment session well today focusing on strengthening the R LE and improving functional mobility. Mild increase in pain/pulling in the lateral portion of the knee during bridging exercise that subsided with rest and exercise modification. Pt continues to have limitations in ROM, strength, and functional ability, and will continue to benefit from skilled physical therapy to address these deficits.     Rehab Potential  Good    PT Treatment/Interventions  ADLs/Self Care Home Management;Cryotherapy;Electrical Stimulation;Moist Heat;Ultrasound;DME Instruction;Gait training;Stair training;Functional mobility training;Therapeutic activities;Therapeutic exercise;Iontophoresis 4mg /ml Dexamethasone;Balance training;Neuromuscular re-education;Patient/family education;Manual techniques;Scar mobilization;Passive range of motion;Dry needling;Taping;Vasopneumatic Device    Consulted and Agree with Plan of Care  Patient       Patient will benefit from skilled therapeutic intervention in order to improve the following deficits and impairments:  Abnormal gait, Hypomobility, Impaired sensation, Pain, Difficulty walking, Decreased mobility, Decreased activity tolerance, Decreased range of motion, Decreased skin integrity, Decreased strength, Increased edema, Increased muscle spasms, Impaired flexibility  Visit Diagnosis: Acute pain of right knee  Stiffness of right knee, not elsewhere classified  Other abnormalities of gait and mobility     Problem List Patient Active Problem List   Diagnosis Date Noted  . S/P total knee arthroplasty, right 01/08/2018  . Primary localized osteoarthritis of right knee 01/03/2018  . Spinal stenosis of lumbar region 05/30/2017  . Postoperative anemia due to acute blood loss 10/11/2015  . Arthritis 10/11/2015  . Constipation  10/11/2015  . Headache 10/11/2015  . Osteoarthritis of left knee 10/07/2015  . Obesity 10/07/2015  . S/P total knee replacement using cement 10/07/2015    Mikal Plane, SPT 01/31/2018, 5:57 PM  [facility] [address]  [address] [place], [place], [address] Phone: [phone]   Fax:  [phone]  Name: Lauren Frye MRN: [id number] Date of Birth: 06-26-1951

## 2018-02-01 ENCOUNTER — Ambulatory Visit: Payer: BLUE CROSS/BLUE SHIELD

## 2018-02-01 ENCOUNTER — Other Ambulatory Visit (INDEPENDENT_AMBULATORY_CARE_PROVIDER_SITE_OTHER): Payer: Self-pay | Admitting: Orthopedic Surgery

## 2018-02-01 ENCOUNTER — Telehealth (INDEPENDENT_AMBULATORY_CARE_PROVIDER_SITE_OTHER): Payer: Self-pay | Admitting: Orthopaedic Surgery

## 2018-02-01 DIAGNOSIS — R2689 Other abnormalities of gait and mobility: Secondary | ICD-10-CM

## 2018-02-01 DIAGNOSIS — M25661 Stiffness of right knee, not elsewhere classified: Secondary | ICD-10-CM

## 2018-02-01 DIAGNOSIS — M25561 Pain in right knee: Secondary | ICD-10-CM

## 2018-02-01 MED ORDER — HYDROMORPHONE HCL 2 MG PO TABS: 2.0000 mg | ORAL_TABLET | ORAL | 0 refills | Status: DC | PRN

## 2018-02-01 NOTE — Telephone Encounter (Signed)
Patient called requesting prescription refill of Hydromorphone to be sent to [facility] [address] [place].  Patient states she is out of medication and needs the refill sent in today.

## 2018-02-01 NOTE — Telephone Encounter (Signed)
meds called into pharmacy

## 2018-02-01 NOTE — Therapy (Signed)
[facility] Outpatient Rehabilitation [facility] [address]  [address] [place], [place], [address] Phone: [phone]   Fax:  [phone]  Physical Therapy Treatment  Patient Details  Name: Lauren Frye MRN: [id number] Date of Birth: 09/15/1950 Referring Provider: [name], MD   Encounter Date: 02/01/2018  PT End of Session - 02/01/18 1406    Visit Number  3    Number of Visits  18    Date for PT Re-Evaluation  03/22/18    PT Start Time  1401    PT Stop Time  1455    PT Time Calculation (min)  54 min    Activity Tolerance  Patient limited by pain;Patient tolerated treatment well    Behavior During Therapy  [facility] for tasks assessed/performed       Past Medical History:  Diagnosis Date  . Arthritis   . Depression    denies  . Headache    migraines    Past Surgical History:  Procedure Laterality Date  . ABDOMINAL HYSTERECTOMY    . ANKLE FUSION    . BOWEL RESECTION    . KNEE ARTHROSCOPY    . KNEE SURGERY Bilateral    LEFT ARTHROCOPY  X3    ,  RT X1  . TONSILLECTOMY    . TOTAL KNEE ARTHROPLASTY Left 10/07/2015  . TOTAL KNEE ARTHROPLASTY Left 10/07/2015   Procedure: TOTAL KNEE ARTHROPLASTY;  Surgeon: Valeria Batman, MD;  Location: [facility] OR;  Service: Orthopedics;  Laterality: Left;  . TOTAL KNEE ARTHROPLASTY Right 01/03/2018  . TOTAL KNEE ARTHROPLASTY Right 01/03/2018   Procedure: RIGHT TOTAL KNEE ARTHROPLASTY;  Surgeon: Valeria Batman, MD;  Location: [facility] OR;  Service: Orthopedics;  Laterality: Right;    There were no vitals filed for this visit.  Subjective Assessment - 02/01/18 1410    Subjective  Pt. doing well today however did have some soreness following last visit.      Pertinent History  L TKA (10/2015)    Patient Stated Goals  To keep the knee moving    Currently in Pain?  Yes    Pain Score  3     Pain Location  Knee    Pain Orientation  Right    Multiple Pain Sites  No                       [facility] Adult PT  Treatment/Exercise - 02/01/18 1439      Lumbar Exercises: Supine   Bridge  -- limited tolerance even with altered LE positioning     Bridge Limitations  terminated due to poor tolerance with lateral knee pain     Straight Leg Raise  15 reps    Straight Leg Raises Limitations  Right; 2#      Knee/Hip Exercises: Aerobic   Nustep  Lvl 4, 7 min       Knee/Hip Exercises: Standing   Heel Raises  Both;10 reps      Knee/Hip Exercises: Seated   Long Arc Quad  Strengthening;Right;15 reps well tolerated     Long Arc Quad Limitations  Red TB around ankle, anchored under L LE    Hamstring Curl  Right;Strengthening;15 reps well tolerated     Hamstring Limitations  Red TB around ankle; anchored under L LE positioned anterior to R LE    Sit to Sand  10 reps;without UE support focusing on slow descending and avoiding knee valgus red TB  Knee/Hip Exercises: Sidelying   Clams  B Clam shell with red TB x 10 reps each      Vasopneumatic   Number Minutes Vasopneumatic   10 minutes    Vasopnuematic Location   Knee    Vasopneumatic Pressure  Low    Vasopneumatic Temperature   Lowest      Manual Therapy   Manual Therapy  Joint mobilization    Manual therapy comments  supine     Joint Mobilization  R patellar mobs all directions                PT Short Term Goals - 01/31/18 1602      PT SHORT TERM GOAL #1   Title  Pt will be independent with initial HEP     Status  On-going      PT SHORT TERM GOAL #2   Title  Pt will ambulate with normal gait pattern with SPC     Status  On-going        PT Long Term Goals - 01/31/18 1601      PT LONG TERM GOAL #1   Title  Pt will be independent with advanced HEP program     Status  On-going    Target Date  03/22/18      PT LONG TERM GOAL #2   Title  Pt will demonstrate R knee AROM 0-120 or greater    Status  On-going    Target Date  03/22/18      PT LONG TERM GOAL #3   Title  Pt will report pain no greater than 4/10 at worst     Status  On-going    Target Date  03/22/18      PT LONG TERM GOAL #4   Title  Pt will ascend/descend 1 flight of stairs with normal gait pattern and LRAD to improve functional mobility     Status  On-going    Target Date  03/22/18      PT LONG TERM GOAL #5   Title  Pt will report abilty to garden with no limitations or increase in pain    Status  On-going    Target Date  03/22/18      Additional Long Term Goals   Additional Long Term Goals  Yes      PT LONG TERM GOAL #6   Title  Pt will have 5/5 global R LE strength to improve ability to perform functional activities including climbing stairs    Status  New    Target Date  03/22/18            Plan - 02/01/18 1408    Clinical Impression Statement  Lauren Frye seen to start session noting increased R knee pain since last visit.  Did have intermittent complaint of R lateral knee pain with bridge, sit<>stand activities today.  Demo'd poor patellar mobility today with manual mobs and encouraged to perform patellar mobs at home as part of home program for improved mobility and ROM.  Pt. with mild R knee pain to end session today thus applied ice/compression to knee to decrease post-exercise soreness and swelling.  Will continue to progress.      PT Treatment/Interventions  ADLs/Self Care Home Management;Cryotherapy;Electrical Stimulation;Moist Heat;Ultrasound;DME Instruction;Gait training;Stair training;Functional mobility training;Therapeutic activities;Therapeutic exercise;Iontophoresis 4mg /ml Dexamethasone;Balance training;Neuromuscular re-education;Patient/family education;Manual techniques;Scar mobilization;Passive range of motion;Dry needling;Taping;Vasopneumatic Device    Consulted and Agree with Plan of Care  Patient       Patient will benefit from skilled  therapeutic intervention in order to improve the following deficits and impairments:  Abnormal gait, Hypomobility, Impaired sensation, Pain, Difficulty walking, Decreased mobility,  Decreased activity tolerance, Decreased range of motion, Decreased skin integrity, Decreased strength, Increased edema, Increased muscle spasms, Impaired flexibility  Visit Diagnosis: Acute pain of right knee  Stiffness of right knee, not elsewhere classified  Other abnormalities of gait and mobility     Problem List Patient Active Problem List   Diagnosis Date Noted  . S/P total knee arthroplasty, right 01/08/2018  . Primary localized osteoarthritis of right knee 01/03/2018  . Spinal stenosis of lumbar region 05/30/2017  . Postoperative anemia due to acute blood loss 10/11/2015  . Arthritis 10/11/2015  . Constipation 10/11/2015  . Headache 10/11/2015  . Osteoarthritis of left knee 10/07/2015  . Obesity 10/07/2015  . S/P total knee replacement using cement 10/07/2015    Kermit Balo, PTA 02/01/18 6:30 PM   [facility] Outpatient Rehabilitation [facility] [address]  [address] [place], [place], [address] Phone: [phone]   Fax:  [phone]  Name: Lauren Frye MRN: [id number] Date of Birth: 14-Aug-1950

## 2018-02-01 NOTE — Telephone Encounter (Signed)
PLEASE ADVISE.

## 2018-02-02 ENCOUNTER — Ambulatory Visit: Payer: BLUE CROSS/BLUE SHIELD | Attending: Orthopaedic Surgery

## 2018-02-02 DIAGNOSIS — M25661 Stiffness of right knee, not elsewhere classified: Secondary | ICD-10-CM | POA: Insufficient documentation

## 2018-02-02 DIAGNOSIS — M25561 Pain in right knee: Secondary | ICD-10-CM | POA: Diagnosis not present

## 2018-02-02 DIAGNOSIS — R2689 Other abnormalities of gait and mobility: Secondary | ICD-10-CM | POA: Diagnosis present

## 2018-02-02 NOTE — Therapy (Signed)
[facility] Outpatient Rehabilitation [facility] [address]  [address] [place], [place], [address] Phone: [phone]   Fax:  [phone]  Physical Therapy Treatment  Patient Details  Name: Lauren Frye MRN: [id number] Date of Birth: 03/15/1951 Referring Provider: [name], MD   Encounter Date: 02/02/2018  PT End of Session - 02/02/18 1404    Visit Number  4    Number of Visits  18    Date for PT Re-Evaluation  03/22/18    PT Start Time  1400    PT Stop Time  1455    PT Time Calculation (min)  55 min    Activity Tolerance  Patient limited by pain;Patient tolerated treatment well    Behavior During Therapy  [facility] for tasks assessed/performed       Past Medical History:  Diagnosis Date  . Arthritis   . Depression    denies  . Headache    migraines    Past Surgical History:  Procedure Laterality Date  . ABDOMINAL HYSTERECTOMY    . ANKLE FUSION    . BOWEL RESECTION    . KNEE ARTHROSCOPY    . KNEE SURGERY Bilateral    LEFT ARTHROCOPY  X3    ,  RT X1  . TONSILLECTOMY    . TOTAL KNEE ARTHROPLASTY Left 10/07/2015  . TOTAL KNEE ARTHROPLASTY Left 10/07/2015   Procedure: TOTAL KNEE ARTHROPLASTY;  Surgeon: Valeria Batman, MD;  Location: [facility] OR;  Service: Orthopedics;  Laterality: Left;  . TOTAL KNEE ARTHROPLASTY Right 01/03/2018  . TOTAL KNEE ARTHROPLASTY Right 01/03/2018   Procedure: RIGHT TOTAL KNEE ARTHROPLASTY;  Surgeon: Valeria Batman, MD;  Location: [facility] OR;  Service: Orthopedics;  Laterality: Right;    There were no vitals filed for this visit.  Subjective Assessment - 02/02/18 1402    Subjective  Pt. reporting L knee soreness following yesterday     Pertinent History  L TKA (10/2015)    Patient Stated Goals  To keep the knee moving    Currently in Pain?  Yes    Pain Score  4     Pain Location  Knee    Pain Orientation  Right    Pain Descriptors / Indicators  Throbbing    Pain Type  Surgical pain    Pain Onset  More than a month ago     Multiple Pain Sites  No                       [facility] Adult PT Treatment/Exercise - 02/02/18 1413      Knee/Hip Exercises: Aerobic   Recumbent Bike  Lvl 1, 7 min       Knee/Hip Exercises: Machines for Strengthening   Cybex Knee Flexion  B LE: 20# x 15 reps       Knee/Hip Exercises: Standing   Terminal Knee Extension  Right;15 reps;Theraband;Strengthening    Theraband Level (Terminal Knee Extension)  Level 4 (Blue)    Terminal Knee Extension Limitations  5" hold     Forward Step Up  15 reps;Left;Step Height: 6";Hand Hold: 1    Forward Step Up Limitations  red TB TKE    Functional Squat  10 reps;3 seconds good tolerance     Functional Squat Limitations  with sustained hip ER into red TB at knees       Knee/Hip Exercises: Seated   Other Seated Knee/Hip Exercises  R fieer leg press (1 bllack, 1 blue) x15  Knee/Hip Exercises: Supine   Short Arc Quad Sets  Right;10 reps    Short Arc Quad Sets Limitations  2#; 10" hold       Vasopneumatic   Number Minutes Vasopneumatic   10 minutes    Vasopnuematic Location   Knee    Vasopneumatic Pressure  Low    Vasopneumatic Temperature   Lowest      Manual Therapy   Manual Therapy  Passive ROM    Manual therapy comments  supine     Passive ROM  L ITB, glute, HS stretch x 30 sec              PT Education - 02/02/18 1611    Education Details  HEP update     Person(s) Educated  Patient    Methods  Explanation;Demonstration;Verbal cues;Handout    Comprehension  Verbalized understanding;Returned demonstration;Verbal cues required;Need further instruction       PT Short Term Goals - 01/31/18 1602      PT SHORT TERM GOAL #1   Title  Pt will be independent with initial HEP     Status  On-going      PT SHORT TERM GOAL #2   Title  Pt will ambulate with normal gait pattern with SPC     Status  On-going        PT Long Term Goals - 01/31/18 1601      PT LONG TERM GOAL #1   Title  Pt will be independent with  advanced HEP program     Status  On-going    Target Date  03/22/18      PT LONG TERM GOAL #2   Title  Pt will demonstrate R knee AROM 0-120 or greater    Status  On-going    Target Date  03/22/18      PT LONG TERM GOAL #3   Title  Pt will report pain no greater than 4/10 at worst    Status  On-going    Target Date  03/22/18      PT LONG TERM GOAL #4   Title  Pt will ascend/descend 1 flight of stairs with normal gait pattern and LRAD to improve functional mobility     Status  On-going    Target Date  03/22/18      PT LONG TERM GOAL #5   Title  Pt will report abilty to garden with no limitations or increase in pain    Status  On-going    Target Date  03/22/18      Additional Long Term Goals   Additional Long Term Goals  Yes      PT LONG TERM GOAL #6   Title  Pt will have 5/5 global R LE strength to improve ability to perform functional activities including climbing stairs    Status  New    Target Date  03/22/18            Plan - 02/02/18 1404    Clinical Impression Statement  Lauren Frye reporting soreness from yesterday's visit, which has improved today.  Is not performing bridges at home due to R knee pain.  With improved tolerance for closed chain LE/quad strengthening activities today however unable to tolerate patellar mobs due to L lateral knee pain.  Addressed ongoing tenderness in L lateral thigh musculature with STM and passive LE stretching with therapist today however pt. very ttp and with limited tolerance for manual massage in this area.  HEP updated.  Ended session  with ice/compression to L knee to decrease post-exercise swelling and pain per pt. request.      PT Treatment/Interventions  ADLs/Self Care Home Management;Cryotherapy;Electrical Stimulation;Moist Heat;Ultrasound;DME Instruction;Gait training;Stair training;Functional mobility training;Therapeutic activities;Therapeutic exercise;Iontophoresis 4mg /ml Dexamethasone;Balance training;Neuromuscular  re-education;Patient/family education;Manual techniques;Scar mobilization;Passive range of motion;Dry needling;Taping;Vasopneumatic Device    Consulted and Agree with Plan of Care  Patient       Patient will benefit from skilled therapeutic intervention in order to improve the following deficits and impairments:  Abnormal gait, Hypomobility, Impaired sensation, Pain, Difficulty walking, Decreased mobility, Decreased activity tolerance, Decreased range of motion, Decreased skin integrity, Decreased strength, Increased edema, Increased muscle spasms, Impaired flexibility  Visit Diagnosis: Acute pain of right knee  Stiffness of right knee, not elsewhere classified  Other abnormalities of gait and mobility     Problem List Patient Active Problem List   Diagnosis Date Noted  . S/P total knee arthroplasty, right 01/08/2018  . Primary localized osteoarthritis of right knee 01/03/2018  . Spinal stenosis of lumbar region 05/30/2017  . Postoperative anemia due to acute blood loss 10/11/2015  . Arthritis 10/11/2015  . Constipation 10/11/2015  . Headache 10/11/2015  . Osteoarthritis of left knee 10/07/2015  . Obesity 10/07/2015  . S/P total knee replacement using cement 10/07/2015    Kermit BaloMicah Lurie Mullane, PTA 02/02/18 4:12 PM   [facility] [place] [address]  [address] [place], [place], [address] Phone: [phone]   Fax:  [phone]  Name: Lauren Frye MRN: [id number] Date of Birth: 1950-10-06

## 2018-02-06 ENCOUNTER — Ambulatory Visit: Payer: BLUE CROSS/BLUE SHIELD

## 2018-02-06 ENCOUNTER — Encounter: Payer: Self-pay | Admitting: Internal Medicine

## 2018-02-06 DIAGNOSIS — M25561 Pain in right knee: Secondary | ICD-10-CM

## 2018-02-06 DIAGNOSIS — R2689 Other abnormalities of gait and mobility: Secondary | ICD-10-CM

## 2018-02-06 DIAGNOSIS — M25661 Stiffness of right knee, not elsewhere classified: Secondary | ICD-10-CM

## 2018-02-06 NOTE — Therapy (Signed)
[facility] Outpatient Rehabilitation [facility] [address]  [address] [place], [place], [address] Phone: [phone]   Fax:  [phone]  Physical Therapy Treatment  Patient Details  Name: Lauren Frye MRN: [id number] Date of Birth: 02-03-1951 Referring Provider: [name], MD   Encounter Date: 02/06/2018  PT End of Session - 02/06/18 1404    Visit Number  5    Number of Visits  18    Date for PT Re-Evaluation  03/22/18    PT Start Time  1400    PT Stop Time  1443    PT Time Calculation (min)  43 min    Activity Tolerance  Patient tolerated treatment well    Behavior During Therapy  [facility] for tasks assessed/performed       Past Medical History:  Diagnosis Date  . Arthritis   . Depression    denies  . Headache    migraines    Past Surgical History:  Procedure Laterality Date  . ABDOMINAL HYSTERECTOMY    . ANKLE FUSION    . BOWEL RESECTION    . KNEE ARTHROSCOPY    . KNEE SURGERY Bilateral    LEFT ARTHROCOPY  X3    ,  RT X1  . TONSILLECTOMY    . TOTAL KNEE ARTHROPLASTY Left 10/07/2015  . TOTAL KNEE ARTHROPLASTY Left 10/07/2015   Procedure: TOTAL KNEE ARTHROPLASTY;  Surgeon: Valeria Batman, MD;  Location: [facility] OR;  Service: Orthopedics;  Laterality: Left;  . TOTAL KNEE ARTHROPLASTY Right 01/03/2018  . TOTAL KNEE ARTHROPLASTY Right 01/03/2018   Procedure: RIGHT TOTAL KNEE ARTHROPLASTY;  Surgeon: Valeria Batman, MD;  Location: [facility] OR;  Service: Orthopedics;  Laterality: Right;    There were no vitals filed for this visit.  Subjective Assessment - 02/06/18 1402    Subjective  Pt. noting she still has occasional "catching", sensation at L knee.  Pt. noting getting in and out of car is getting easier now.      Pertinent History  L TKA (10/2015)    Patient Stated Goals  To keep the knee moving    Currently in Pain?  Yes    Pain Score  2     Pain Location  Knee    Pain Orientation  Right    Pain Descriptors / Indicators  Throbbing    Pain  Type  Surgical pain    Pain Onset  More than a month ago    Pain Frequency  Intermittent    Aggravating Factors   bending the kneel,     Multiple Pain Sites  No                       [facility] Adult PT Treatment/Exercise - 02/06/18 1409      Lumbar Exercises: Supine   Straight Leg Raise  15 reps    Straight Leg Raises Limitations  Right; 2#      Knee/Hip Exercises: Aerobic   Recumbent Bike  Lvl 2, 7 min       Knee/Hip Exercises: Standing   Heel Raises  Both;15 reps    Heel Raises Limitations  R wt. shift on ecc    Hip Abduction  Right;Left;10 reps;Stengthening;Knee straight    Abduction Limitations  yellow TB at ankle    Forward Step Up  10 reps;Left;Right;Step Height: 8";Hand Hold: 1    Forward Step Up Limitations  1 ski pole    Functional Squat  3 seconds x 12  reps     Functional Squat Limitations  cues to avoid knee valgus     Wall Squat  10 reps;3 seconds "mini"    Wall Squat Limitations  leaning on orange p-ball on wall       Knee/Hip Exercises: Seated   Long Arc Quad  Strengthening;Right;Left;15 reps    Long Arc Quad Weight  2 lbs.    Long [employer] Quad Limitations  adduction ball squeeze     Other Seated Knee/Hip Exercises  B fitter hip extension (1 black, 1 blue band) x 15 reps       Knee/Hip Exercises: Supine   Straight Leg Raises  --    Other Supine Knee/Hip Exercises  Straight leg bridge x 10 reps  heels on peanut p-ball             PT Education - 02/06/18 1445    Education Details  HEP update     Person(s) Educated  Patient    Methods  Explanation;Demonstration;Verbal cues;Handout    Comprehension  Verbalized understanding;Returned demonstration;Verbal cues required;Need further instruction       PT Short Term Goals - 01/31/18 1602      PT SHORT TERM GOAL #1   Title  Pt will be independent with initial HEP     Status  On-going      PT SHORT TERM GOAL #2   Title  Pt will ambulate with normal gait pattern with SPC     Status  On-going         PT Long Term Goals - 01/31/18 1601      PT LONG TERM GOAL #1   Title  Pt will be independent with advanced HEP program     Status  On-going    Target Date  03/22/18      PT LONG TERM GOAL #2   Title  Pt will demonstrate R knee AROM 0-120 or greater    Status  On-going    Target Date  03/22/18      PT LONG TERM GOAL #3   Title  Pt will report pain no greater than 4/10 at worst    Status  On-going    Target Date  03/22/18      PT LONG TERM GOAL #4   Title  Pt will ascend/descend 1 flight of stairs with normal gait pattern and LRAD to improve functional mobility     Status  On-going    Target Date  03/22/18      PT LONG TERM GOAL #5   Title  Pt will report abilty to garden with no limitations or increase in pain    Status  On-going    Target Date  03/22/18      Additional Long Term Goals   Additional Long Term Goals  Yes      PT LONG TERM GOAL #6   Title  Pt will have 5/5 global R LE strength to improve ability to perform functional activities including climbing stairs    Status  New    Target Date  03/22/18            Plan - 02/06/18 1405    Clinical Impression Statement  Darl PikesSusan doing well today.  Reports her R knee is feeling better today however feels L knee may be "giving me more trouble".  Did have improved tolerance for closed chain LE strengthening activities today.  Difficulty/pain at R lateral knee today with supine level activities today however feels overall R knee  pain has improved with therapy.  Progressing well toward goals.      PT Treatment/Interventions  ADLs/Self Care Home Management;Cryotherapy;Electrical Stimulation;Moist Heat;Ultrasound;DME Instruction;Gait training;Stair training;Functional mobility training;Therapeutic activities;Therapeutic exercise;Iontophoresis 4mg /ml Dexamethasone;Balance training;Neuromuscular re-education;Patient/family education;Manual techniques;Scar mobilization;Passive range of motion;Dry needling;Taping;Vasopneumatic  Device    Consulted and Agree with Plan of Care  Patient       Patient will benefit from skilled therapeutic intervention in order to improve the following deficits and impairments:  Abnormal gait, Hypomobility, Impaired sensation, Pain, Difficulty walking, Decreased mobility, Decreased activity tolerance, Decreased range of motion, Decreased skin integrity, Decreased strength, Increased edema, Increased muscle spasms, Impaired flexibility  Visit Diagnosis: Acute pain of right knee  Stiffness of right knee, not elsewhere classified  Other abnormalities of gait and mobility     Problem List Patient Active Problem List   Diagnosis Date Noted  . S/P total knee arthroplasty, right 01/08/2018  . Primary localized osteoarthritis of right knee 01/03/2018  . Spinal stenosis of lumbar region 05/30/2017  . Postoperative anemia due to acute blood loss 10/11/2015  . Arthritis 10/11/2015  . Constipation 10/11/2015  . Headache 10/11/2015  . Osteoarthritis of left knee 10/07/2015  . Obesity 10/07/2015  . S/P total knee replacement using cement 10/07/2015    Kermit Balo, PTA 02/06/18 5:56 PM    [facility] Outpatient Rehabilitation [facility] [address]  [address] [place], [place], [address] Phone: [phone]   Fax:  [phone]  Name: REBAKAH COKLEY MRN: [id number] Date of Birth: June 16, 1951

## 2018-02-08 ENCOUNTER — Other Ambulatory Visit (INDEPENDENT_AMBULATORY_CARE_PROVIDER_SITE_OTHER): Payer: Self-pay | Admitting: Orthopedic Surgery

## 2018-02-08 ENCOUNTER — Ambulatory Visit: Payer: BLUE CROSS/BLUE SHIELD

## 2018-02-08 ENCOUNTER — Telehealth (INDEPENDENT_AMBULATORY_CARE_PROVIDER_SITE_OTHER): Payer: Self-pay | Admitting: Orthopaedic Surgery

## 2018-02-08 DIAGNOSIS — M25661 Stiffness of right knee, not elsewhere classified: Secondary | ICD-10-CM

## 2018-02-08 DIAGNOSIS — M25561 Pain in right knee: Secondary | ICD-10-CM

## 2018-02-08 DIAGNOSIS — R2689 Other abnormalities of gait and mobility: Secondary | ICD-10-CM

## 2018-02-08 MED ORDER — METHOCARBAMOL 500 MG PO TABS: ORAL_TABLET | ORAL | 0 refills | Status: DC

## 2018-02-08 NOTE — Telephone Encounter (Signed)
Patient called [facility] prescription [facility] [address] [place].

## 2018-02-08 NOTE — Therapy (Signed)
Lauren Frye [address]  [place], [place], [address] Phone: [phone]   Fax:  [phone]  Physical Therapy Treatment  Patient Details  Name: Lauren Frye MRN: [id number] Date of Birth: 02-14-1951 Referring Provider: Joni Fears, MD   Encounter Date: 02/08/2018  PT End of Session - 02/08/18 1408    Visit Number  6    Number of Visits  18    Date for PT Re-Evaluation  03/22/18    PT Start Time  1401    PT Stop Time  1444    PT Time Calculation (min)  43 min    Activity Tolerance  Patient tolerated treatment well    Behavior During Therapy  [facility] for tasks assessed/performed       Past Medical History:  Diagnosis Date  . Arthritis   . Depression    denies  . Headache    migraines    Past Surgical History:  Procedure Laterality Date  . ABDOMINAL HYSTERECTOMY    . ANKLE FUSION    . BOWEL RESECTION    . KNEE ARTHROSCOPY    . KNEE SURGERY Bilateral    LEFT ARTHROCOPY  X3    ,  RT X1  . TONSILLECTOMY    . TOTAL KNEE ARTHROPLASTY Left 10/07/2015  . TOTAL KNEE ARTHROPLASTY Left 10/07/2015   Procedure: TOTAL KNEE ARTHROPLASTY;  Surgeon: Lauren Balding, MD;  Location: [place];  Service: Orthopedics;  Laterality: Left;  . TOTAL KNEE ARTHROPLASTY Right 01/03/2018  . TOTAL KNEE ARTHROPLASTY Right 01/03/2018   Procedure: RIGHT TOTAL KNEE ARTHROPLASTY;  Surgeon: Lauren Balding, MD;  Location: [place];  Service: Orthopedics;  Laterality: Right;    There were no vitals filed for this visit.  Subjective Assessment - 02/08/18 1407    Subjective  Pt. noting ~ 50% reduction in overall pain     Pertinent History  L TKA (10/2015)    Patient Stated Goals  To keep the knee moving    Currently in Pain?  Yes    Pain Score  1     Pain Location  Knee    Pain Orientation  Right    Pain Descriptors / Indicators  Throbbing    Pain Type  Surgical pain    Pain Onset  More than a month ago    Pain Frequency   Intermittent    Aggravating Factors   Bending the knee    Multiple Pain Sites  No                       [facility] Adult PT Treatment/Exercise - 02/08/18 1415      Ambulation/Gait   Ambulation/Gait  Yes    Ambulation/Gait Assistance  6: Modified independent (Device/Increase time)    Ambulation Distance (Feet)  100 Feet    Assistive device  Straight cane    Gait Pattern  Within Functional Limits;Step-through pattern    Ambulation Surface  Level;Indoor    Stairs  Yes    Stair Management Technique  One rail Right;Step to pattern;With cane    Number of Stairs  5    Height of Stairs  8      Knee/Hip Exercises: Aerobic   Nustep  Lvl 4, 7 min       Knee/Hip Exercises: Machines for Strengthening   Cybex Knee Flexion  B LE's: 30# x 10 reps     Cybex Leg Press  B LE's: 25# x  15 reps       Knee/Hip Exercises: Standing   Heel Raises  2 sets;15 reps    Heel Raises Limitations  R and L wt. shift with ecc     Knee Flexion  Right;Left;Strengthening;10 reps    Knee Flexion Limitations  yellow looped TB     Other Standing Knee Exercises  B side stepping with red looped TB at ankles 2 x 25 ft       Knee/Hip Exercises: Seated   Other Seated Knee/Hip Exercises  B leg press (2 black bands) x 10 reps     Sit to Sand  10 reps;without UE support from mat table to airex pad on floor               PT Short Term Goals - 02/08/18 1455      PT SHORT TERM GOAL #1   Title  Pt will be independent with initial HEP     Status  Achieved      PT SHORT TERM GOAL #2   Title  Pt will ambulate with normal gait pattern with SPC     Status  Achieved        PT Long Term Goals - 01/31/18 1601      PT LONG TERM GOAL #1   Title  Pt will be independent with advanced HEP program     Status  On-going    Target Date  03/22/18      PT LONG TERM GOAL #2   Title  Pt will demonstrate R knee AROM 0-120 or greater    Status  On-going    Target Date  03/22/18      PT LONG TERM GOAL #3    Title  Pt will report pain no greater than 4/10 at worst    Status  On-going    Target Date  03/22/18      PT LONG TERM GOAL #4   Title  Pt will ascend/descend 1 flight of stairs with normal gait pattern and LRAD to improve functional mobility     Status  On-going    Target Date  03/22/18      PT LONG TERM GOAL #5   Title  Pt will report abilty to garden with no limitations or increase in pain    Status  On-going    Target Date  03/22/18      Additional Long Term Goals   Additional Long Term Goals  Yes      PT LONG TERM GOAL #6   Title  Pt will have 5/5 global R LE strength to improve ability to perform functional activities including climbing stairs    Status  New    Target Date  03/22/18            Plan - 02/08/18 1409    Clinical Impression Statement  [name] reporting ~ 50% improvement in B knee pain since starting therapy.  Notes R knee has bothered her today without known trigger with continued low-level, lateral, "catching" pain intermittently.  Pt. tolerated addition of fitter hip extension, leg press, and progression of machine hamstring curl well today.  Has now met STG's and ambulating with normal gait pattern with SPC.  Notes she is no longer walking with SPC at home.  Able to navigate stairs pain free with step-to pattern and with mild R knee pain with alternating pattern.  Does still demo some B knee instability with descnending.  Will continue to benefit from further skilled  therapy to improve functional strength and ROM.      PT Treatment/Interventions  ADLs/Self Care Home Management;Cryotherapy;Electrical Stimulation;Moist Heat;Ultrasound;DME Instruction;Gait training;Stair training;Functional mobility training;Therapeutic activities;Therapeutic exercise;Iontophoresis 39m/ml Dexamethasone;Balance training;Neuromuscular re-education;Patient/family education;Manual techniques;Scar mobilization;Passive range of motion;Dry needling;Taping;Vasopneumatic Device    Consulted  and Agree with Plan of Care  Patient       Patient will benefit from skilled therapeutic intervention in order to improve the following deficits and impairments:  Abnormal gait, Hypomobility, Impaired sensation, Pain, Difficulty walking, Decreased mobility, Decreased activity tolerance, Decreased range of motion, Decreased skin integrity, Decreased strength, Increased edema, Increased muscle spasms, Impaired flexibility  Visit Diagnosis: Acute pain of right knee  Stiffness of right knee, not elsewhere classified  Other abnormalities of gait and mobility     Problem List Patient Active Problem List   Diagnosis Date Noted  . S/P total knee arthroplasty, right 01/08/2018  . Primary localized osteoarthritis of right knee 01/03/2018  . Spinal stenosis of lumbar region 05/30/2017  . Postoperative anemia due to acute blood loss 10/11/2015  . Arthritis 10/11/2015  . Constipation 10/11/2015  . Headache 10/11/2015  . Osteoarthritis of left knee 10/07/2015  . Obesity 10/07/2015  . S/P total knee replacement using cement 10/07/2015    MBess Harvest PTA 02/08/18 3:18 PM   [place] [address] [place] [address]Phone: [phone]  Fax:  [phone] Name: Lauren PARADISEMRN: [id number] of Birth: [birth year]

## 2018-02-08 NOTE — Telephone Encounter (Signed)
Sent to [facility]

## 2018-02-08 NOTE — Telephone Encounter (Signed)
LMOM RX escribed

## 2018-02-08 NOTE — Telephone Encounter (Signed)
Please advise 

## 2018-02-09 ENCOUNTER — Encounter: Payer: Self-pay | Admitting: Physical Therapy

## 2018-02-09 ENCOUNTER — Ambulatory Visit: Payer: BLUE CROSS/BLUE SHIELD | Admitting: Physical Therapy

## 2018-02-09 DIAGNOSIS — R2689 Other abnormalities of gait and mobility: Secondary | ICD-10-CM

## 2018-02-09 DIAGNOSIS — M25561 Pain in right knee: Secondary | ICD-10-CM | POA: Diagnosis not present

## 2018-02-09 DIAGNOSIS — M25661 Stiffness of right knee, not elsewhere classified: Secondary | ICD-10-CM

## 2018-02-09 NOTE — Therapy (Signed)
[facility] [address]  [address] [place], [place], [address] Phone: [phone]   Fax:  [phone]  Physical Therapy Treatment  Patient Details  Name: RONEKA GILPIN MRN: [id number] Date of Birth: August 31, 1950 Referring Provider: [name], MD   Encounter Date: 02/09/2018  PT End of Session - 02/09/18 1446    Visit Number  7    Number of Visits  18    Date for PT Re-Evaluation  03/22/18    PT Start Time  1359    PT Stop Time  1443    PT Time Calculation (min)  44 min    Activity Tolerance  Patient tolerated treatment well    Behavior During Therapy  [facility] for tasks assessed/performed       Past Medical History:  Diagnosis Date  . Arthritis   . Depression    denies  . Headache    migraines    Past Surgical History:  Procedure Laterality Date  . ABDOMINAL HYSTERECTOMY    . ANKLE FUSION    . BOWEL RESECTION    . KNEE ARTHROSCOPY    . KNEE SURGERY Bilateral    LEFT ARTHROCOPY  X3    ,  RT X1  . TONSILLECTOMY    . TOTAL KNEE ARTHROPLASTY Left 10/07/2015  . TOTAL KNEE ARTHROPLASTY Left 10/07/2015   Procedure: TOTAL KNEE ARTHROPLASTY;  Surgeon: Valeria Batman, MD;  Location: [facility] OR;  Service: Orthopedics;  Laterality: Left;  . TOTAL KNEE ARTHROPLASTY Right 01/03/2018  . TOTAL KNEE ARTHROPLASTY Right 01/03/2018   Procedure: RIGHT TOTAL KNEE ARTHROPLASTY;  Surgeon: Valeria Batman, MD;  Location: [facility] OR;  Service: Orthopedics;  Laterality: Right;    There were no vitals filed for this visit.  Subjective Assessment - 02/09/18 1403    Subjective  Pt noting continued decrease in pain levels today with more tightness around the joint. States that she is only using the cane when walking outside of her home to prevent a fall.     Pertinent History  L TKA (10/2015)    Limitations  Sitting;Standing;House hold activities    How long can you sit comfortably?  1 hour    How long can you walk comfortably?  30 minutes     Patient Stated Goals  To keep the knee moving    Currently in Pain?  Yes    Pain Score  3     Pain Location  Knee    Pain Orientation  Right    Pain Descriptors / Indicators  Throbbing;Aching    Pain Type  Surgical pain                       [facility] Adult PT Treatment/Exercise - 02/09/18 0001      Self-Care   Self-Care  Retrograde Massage    Retrograde Massage  Pt given instruction in self-massage and utilization of therapeutic tools including stick and tennis ball or similar objects to address restriction in ITB and TFL.       Exercises   Exercises  Knee/Hip      Knee/Hip Exercises: Stretches   ITB Stretch  Right;3 reps;30 seconds    ITB Stretch Limitations  With L UE on counter      Knee/Hip Exercises: Machines for Strengthening   Cybex Leg Press  B LE's; 25#; 2 x 12 reps      Knee/Hip Exercises: Standing   Heel Raises  15 reps;Both  Forward Step Up  10 reps;Right    Forward Step Up Limitations  At counter with 2 UE support      Manual Therapy   Manual Therapy  Myofascial release    Manual therapy comments  Supine in hooklying    Myofascial Release  To lateral aspect of knee and vastus lateralis; along the length of the IT band to the TFL. Pt expressed significant pain and trigger points felt in the middle of the ITB as well as the TFL musculature. No twitch response elicited today as pt was very TTP and PT was unable to address with adequate pressure. PT instructed pt in self-care techniques to decrease sensitivity and decrease TTP.                PT Short Term Goals - 02/08/18 1455      PT SHORT TERM GOAL #1   Title  Pt will be independent with initial HEP     Status  Achieved      PT SHORT TERM GOAL #2   Title  Pt will ambulate with normal gait pattern with SPC     Status  Achieved        PT Long Term Goals - 01/31/18 1601      PT LONG TERM GOAL #1   Title  Pt will be independent with advanced HEP program     Status  On-going     Target Date  03/22/18      PT LONG TERM GOAL #2   Title  Pt will demonstrate R knee AROM 0-120 or greater    Status  On-going    Target Date  03/22/18      PT LONG TERM GOAL #3   Title  Pt will report pain no greater than 4/10 at worst    Status  On-going    Target Date  03/22/18      PT LONG TERM GOAL #4   Title  Pt will ascend/descend 1 flight of stairs with normal gait pattern and LRAD to improve functional mobility     Status  On-going    Target Date  03/22/18      PT LONG TERM GOAL #5   Title  Pt will report abilty to garden with no limitations or increase in pain    Status  On-going    Target Date  03/22/18      Additional Long Term Goals   Additional Long Term Goals  Yes      PT LONG TERM GOAL #6   Title  Pt will have 5/5 global R LE strength to improve ability to perform functional activities including climbing stairs    Status  New    Target Date  03/22/18            Plan - 02/09/18 1500    Clinical Impression Statement  Darl PikesSusan demonstrates improved gait pattern when walking into today's session without using SPC. Today's session focused on manual therapy to address fascial restrictions that pt expressed discomfort with and to address pain. Pt instructed in self-care techniques address trigger points at home, and provided with information in trigger point dry needling if interested next session. Pt will continue to benefit from physical therapy to address her functional impairments and progress toward functional goals.     PT Treatment/Interventions  ADLs/Self Care Home Management;Cryotherapy;Electrical Stimulation;Moist Heat;Ultrasound;DME Instruction;Gait training;Stair training;Functional mobility training;Therapeutic activities;Therapeutic exercise;Iontophoresis 4mg /ml Dexamethasone;Balance training;Neuromuscular re-education;Patient/family education;Manual techniques;Scar mobilization;Passive range of motion;Dry needling;Taping;Vasopneumatic Device  PT Next  Visit Plan  Dry needling with pt consent    Consulted and Agree with Plan of Care  Patient       Patient will benefit from skilled therapeutic intervention in order to improve the following deficits and impairments:  Abnormal gait, Hypomobility, Impaired sensation, Pain, Difficulty walking, Decreased mobility, Decreased activity tolerance, Decreased range of motion, Decreased skin integrity, Decreased strength, Increased edema, Increased muscle spasms, Impaired flexibility  Visit Diagnosis: Acute pain of right knee  Stiffness of right knee, not elsewhere classified  Other abnormalities of gait and mobility     Problem List Patient Active Problem List   Diagnosis Date Noted  . S/P total knee arthroplasty, right 01/08/2018  . Primary localized osteoarthritis of right knee 01/03/2018  . Spinal stenosis of lumbar region 05/30/2017  . Postoperative anemia due to acute blood loss 10/11/2015  . Arthritis 10/11/2015  . Constipation 10/11/2015  . Headache 10/11/2015  . Osteoarthritis of left knee 10/07/2015  . Obesity 10/07/2015  . S/P total knee replacement using cement 10/07/2015    Mikal Plane, SPT 02/09/2018, 3:02 PM  [facility] [address]  [address] [place], [place], [address] Phone: [phone]   Fax:  [phone]  Name: LILIAN FUHS MRN: [id number] Date of Birth: [birth year]

## 2018-02-09 NOTE — Patient Instructions (Addendum)

## 2018-02-13 ENCOUNTER — Encounter: Payer: Self-pay | Admitting: Physical Therapy

## 2018-02-13 ENCOUNTER — Ambulatory Visit: Payer: BLUE CROSS/BLUE SHIELD | Admitting: Physical Therapy

## 2018-02-13 DIAGNOSIS — M25661 Stiffness of right knee, not elsewhere classified: Secondary | ICD-10-CM

## 2018-02-13 DIAGNOSIS — M25561 Pain in right knee: Secondary | ICD-10-CM

## 2018-02-13 DIAGNOSIS — R2689 Other abnormalities of gait and mobility: Secondary | ICD-10-CM

## 2018-02-13 NOTE — Therapy (Signed)
[facility] [address]  [address] [place], [place], [address] Phone: [phone]   Fax:  [phone]  Physical Therapy Treatment  Patient Details  Name: Lauren Frye MRN: [id number] Date of Birth: 07-12-1950 Referring Provider: Norlene CampbellPeter Whitfield, Frye   Encounter Date: 02/13/2018  PT End of Session - 02/13/18 1101    Visit Number  8    Number of Visits  18    Date for PT Re-Evaluation  03/22/18    PT Start Time  1102    PT Stop Time  1147    PT Time Calculation (min)  45 min    Activity Tolerance  Patient tolerated treatment well    Behavior During Therapy  [facility] for tasks assessed/performed       Past Medical History:  Diagnosis Date  . Arthritis   . Depression    denies  . Headache    migraines    Past Surgical History:  Procedure Laterality Date  . ABDOMINAL HYSTERECTOMY    . ANKLE FUSION    . BOWEL RESECTION    . KNEE ARTHROSCOPY    . KNEE SURGERY Bilateral    LEFT ARTHROCOPY  X3    ,  RT X1  . TONSILLECTOMY    . TOTAL KNEE ARTHROPLASTY Left 10/07/2015  . TOTAL KNEE ARTHROPLASTY Left 10/07/2015   Procedure: TOTAL KNEE ARTHROPLASTY;  Surgeon: Lauren Frye;  [facility];  Service: Orthopedics;  Laterality: Left;  . TOTAL KNEE ARTHROPLASTY Right 01/03/2018  . TOTAL KNEE ARTHROPLASTY Right 01/03/2018   Procedure: RIGHT TOTAL KNEE ARTHROPLASTY;  Surgeon: Lauren BatmanWhitfield, Lauren Frye, Frye;  [facility];  Service: Orthopedics;  Laterality: Right;    There were no vitals filed for this visit.  Subjective Assessment - 02/13/18 1104    Subjective  Pt noting decreased pain levels today but increased swelling on the lateral part of the knee. Pt states that she put some ice on it this morning that helped with the swelling.     Pertinent History  L TKA (10/2015)    Limitations  Sitting;Standing;House hold activities    Currently in Pain?  Yes    Pain Score  2     Pain Location  Knee    Pain Orientation  Right     Pain Descriptors / Indicators  Pressure;Tightness    Pain Type  Surgical pain                       [facility] Adult PT Treatment/Exercise - 02/13/18 0001      Exercises   Exercises  Knee/Hip      Knee/Hip Exercises: Aerobic   Nustep  L5 x 6 min      Knee/Hip Exercises: Standing   Hip Flexion  Both;10 reps;Knee straight    Hip Flexion Limitations  Red TB at ankle; 1 UE support    Hip ADduction  Both;10 reps    Hip ADduction Limitations  Red TB at ankle; 1 UE support    Hip Abduction  Both;10 reps    Abduction Limitations  Red TB at ankle; 1 UE support    Hip Extension  Both;10 reps    Extension Limitations  2 UE support for balance    Other Standing Knee Exercises  Bil side stepping; 10 reps in each direction with Red TB at ankle     Other Standing Knee Exercises  Monster walking forward/ backward 2 x 10 reps in  each direction with Red TB wrapped just below knees.       Manual Therapy   Manual Therapy  Soft tissue mobilization;Myofascial release    Manual therapy comments  Supine    Myofascial Release  To entire length of lateral quad. Significant trigger points found in distal/ proximal quad musculature with pt not tolerating increased pressure to address. Good response noted in middle of quad musculature with some relief noted by pt       Trigger Point Dry Needling - 02/13/18 1102    Consent Given?  Yes    Education Handout Provided  Yes    Muscles Treated Lower Body  Quadriceps    Quadriceps Response  Twitch response elicited;Palpable increased muscle length   R mid & distal VL            PT Short Term Goals - 02/08/18 1455      PT SHORT TERM GOAL #1   Title  Pt will be independent with initial HEP     Status  Achieved      PT SHORT TERM GOAL #2   Title  Pt will ambulate with normal gait pattern with SPC     Status  Achieved        PT Long Term Goals - 01/31/18 1601      PT LONG TERM GOAL #1   Title  Pt will be independent with advanced  HEP program     Status  On-going    Target Date  03/22/18      PT LONG TERM GOAL #2   Title  Pt will demonstrate R knee AROM 0-120 or greater    Status  On-going    Target Date  03/22/18      PT LONG TERM GOAL #3   Title  Pt will report pain no greater than 4/10 at worst    Status  On-going    Target Date  03/22/18      PT LONG TERM GOAL #4   Title  Pt will ascend/descend 1 flight of stairs with normal gait pattern and LRAD to improve functional mobility     Status  On-going    Target Date  03/22/18      PT LONG TERM GOAL #5   Title  Pt will report abilty to garden with no limitations or increase in pain    Status  On-going    Target Date  03/22/18      Additional Long Term Goals   Additional Long Term Goals  Yes      PT LONG TERM GOAL #6   Title  Pt will have 5/5 global R LE strength to improve ability to perform functional activities including climbing stairs    Status  New    Target Date  03/22/18            Plan - 02/13/18 1506    Clinical Impression Statement  Lauren Frye noting decreased pain in lateral quad musculature and lateral part of knee after dry needling during today's session. She stated that the "tight" feeling that she was feeling during the beginning of the session is completely gone. Functional activities today focused on strengthening hip musculature to provide support to the knee and improve functional mobility. She will continue to benefit from physical therapy to progress toward her goals and improve her R knee ROM.     Rehab Potential  Good    PT Treatment/Interventions  ADLs/Self Care Home Management;Cryotherapy;Electrical Stimulation;Moist Heat;Ultrasound;DME Instruction;Gait training;Stair training;Functional  mobility training;Therapeutic activities;Therapeutic exercise;Iontophoresis 4mg /ml Dexamethasone;Balance training;Neuromuscular re-education;Patient/family education;Manual techniques;Scar mobilization;Passive range of motion;Dry  needling;Taping;Vasopneumatic Device    Consulted and Agree with Plan of Care  Patient       Patient will benefit from skilled therapeutic intervention in order to improve the following deficits and impairments:  Abnormal gait, Hypomobility, Impaired sensation, Pain, Difficulty walking, Decreased mobility, Decreased activity tolerance, Decreased range of motion, Decreased skin integrity, Decreased strength, Increased edema, Increased muscle spasms, Impaired flexibility  Visit Diagnosis: Acute pain of right knee  Stiffness of right knee, not elsewhere classified  Other abnormalities of gait and mobility     Problem List Patient Active Problem List   Diagnosis Date Noted  . S/P total knee arthroplasty, right 01/08/2018  . Primary localized osteoarthritis of right knee 01/03/2018  . Spinal stenosis of lumbar region 05/30/2017  . Postoperative anemia due to acute blood loss 10/11/2015  . Arthritis 10/11/2015  . Constipation 10/11/2015  . Headache 10/11/2015  . Osteoarthritis of left knee 10/07/2015  . Obesity 10/07/2015  . S/P total knee replacement using cement 10/07/2015    Mikal Plane, SPT  02/13/2018, 3:08 PM  [facility] [address]  [address] [place], [place], [address] Phone: [phone]   Fax:  [phone]  Name: ELSPETH BLUCHER MRN: [id number] Date of Birth: 1951-04-20

## 2018-02-16 ENCOUNTER — Ambulatory Visit: Payer: BLUE CROSS/BLUE SHIELD

## 2018-02-16 DIAGNOSIS — M25561 Pain in right knee: Secondary | ICD-10-CM | POA: Diagnosis not present

## 2018-02-16 DIAGNOSIS — R2689 Other abnormalities of gait and mobility: Secondary | ICD-10-CM

## 2018-02-16 DIAGNOSIS — M25661 Stiffness of right knee, not elsewhere classified: Secondary | ICD-10-CM

## 2018-02-16 NOTE — Therapy (Signed)
[facility] [address]  [address] [place], [place], [address] Phone: [phone]   Fax:  [phone]  Physical Therapy Treatment  Patient Details  Name: Lauren Frye MRN: [id number] Date of Birth: 1951-05-16 Referring Provider: [name], MD   Encounter Date: 02/16/2018  PT End of Session - 02/16/18 1407    Visit Number  9    Number of Visits  18    Date for PT Re-Evaluation  03/22/18    PT Start Time  1404    PT Stop Time  1443    PT Time Calculation (min)  39 min    Activity Tolerance  Patient tolerated treatment well    Behavior During Therapy  [facility] for tasks assessed/performed       Past Medical History:  Diagnosis Date  . Arthritis   . Depression    denies  . Headache    migraines    Past Surgical History:  Procedure Laterality Date  . ABDOMINAL HYSTERECTOMY    . ANKLE FUSION    . BOWEL RESECTION    . KNEE ARTHROSCOPY    . KNEE SURGERY Bilateral    LEFT ARTHROCOPY  X3    ,  RT X1  . TONSILLECTOMY    . TOTAL KNEE ARTHROPLASTY Left 10/07/2015  . TOTAL KNEE ARTHROPLASTY Left 10/07/2015   Procedure: TOTAL KNEE ARTHROPLASTY;  Surgeon: Valeria Batman, MD;  Location: [facility] OR;  Service: Orthopedics;  Laterality: Left;  . TOTAL KNEE ARTHROPLASTY Right 01/03/2018  . TOTAL KNEE ARTHROPLASTY Right 01/03/2018   Procedure: RIGHT TOTAL KNEE ARTHROPLASTY;  Surgeon: Valeria Batman, MD;  Location: [facility] OR;  Service: Orthopedics;  Laterality: Right;    There were no vitals filed for this visit.  Subjective Assessment - 02/16/18 1406    Subjective  Pt. noting some soreness after DN however notes benefit from DN     Pertinent History  L TKA (10/2015)    Patient Stated Goals  To keep the knee moving    Currently in Pain?  Yes    Pain Score  4     Pain Location  Knee    Pain Orientation  Right    Pain Descriptors / Indicators  --   "stiffness"   Pain Type  Surgical pain    Pain Onset  More than a month ago     Aggravating Factors   bending knee     Multiple Pain Sites  No         [facility] PT Assessment - 02/16/18 1429      AROM   AROM Assessment Site  Knee    Right/Left Knee  Right    Right Knee Extension  2    Right Knee Flexion  106                   [facility] Adult PT Treatment/Exercise - 02/16/18 1417      Knee/Hip Exercises: Aerobic   Recumbent Bike  Lvl 2, 7 min       Knee/Hip Exercises: Standing   Lateral Step Up  Step Height: 8";Right;Left;Hand Hold: 2    Lateral Step Up Limitations  x 12 reps     Step Down  Left;10 reps;Step Height: 6";Hand Hold: 1    Step Down Limitations  with L step down     Wall Squat  15 reps;3 seconds    Wall Squat Limitations  leaning on orange p-ball     SLS  L SLS 2 x 20 sec     SLS with Vectors  L SLS 2 x 20 sec with opposite LE toe touch to cones with light chair support      Knee/Hip Exercises: Seated   Hamstring Curl  Right;10 reps    Hamstring Limitations  green TB      Knee/Hip Exercises: Supine   Bridges with Ball Squeeze  10 reps   poor toleranc e     Manual Therapy   Manual Therapy  Passive ROM    Manual therapy comments  Supine    Passive ROM  B manual ITB, glute stretch with therapist x 30 sec each way                PT Short Term Goals - 02/08/18 1455      PT SHORT TERM GOAL #1   Title  Pt will be independent with initial HEP     Status  Achieved      PT SHORT TERM GOAL #2   Title  Pt will ambulate with normal gait pattern with SPC     Status  Achieved        PT Long Term Goals - 01/31/18 1601      PT LONG TERM GOAL #1   Title  Pt will be independent with advanced HEP program     Status  On-going    Target Date  03/22/18      PT LONG TERM GOAL #2   Title  Pt will demonstrate R knee AROM 0-120 or greater    Status  On-going    Target Date  03/22/18      PT LONG TERM GOAL #3   Title  Pt will report pain no greater than 4/10 at worst    Status  On-going    Target Date  03/22/18      PT  LONG TERM GOAL #4   Title  Pt will ascend/descend 1 flight of stairs with normal gait pattern and LRAD to improve functional mobility     Status  On-going    Target Date  03/22/18      PT LONG TERM GOAL #5   Title  Pt will report abilty to garden with no limitations or increase in pain    Status  On-going    Target Date  03/22/18      Additional Long Term Goals   Additional Long Term Goals  Yes      PT LONG TERM GOAL #6   Title  Pt will have 5/5 global R LE strength to improve ability to perform functional activities including climbing stairs    Status  New    Target Date  03/22/18            Plan - 02/16/18 1408    Clinical Impression Statement  Darl PikesSusan primary complaint today was R knee "stiffness", noting she no longer has significant R knee pain with daily activities.  Does continue to have poor tolerance for bridging activity due to R lateral knee pain in session.  ROM progressed today to 2-106 dg today and pt. with visible improvement in eccentric step-down control.  Seems to be progressing well toward goals.      PT Treatment/Interventions  ADLs/Self Care Home Management;Cryotherapy;Electrical Stimulation;Moist Heat;Ultrasound;DME Instruction;Gait training;Stair training;Functional mobility training;Therapeutic activities;Therapeutic exercise;Iontophoresis 4mg /ml Dexamethasone;Balance training;Neuromuscular re-education;Patient/family education;Manual techniques;Scar mobilization;Passive range of motion;Dry needling;Taping;Vasopneumatic Device    Consulted and Agree with Plan of Care  Patient  Patient will benefit from skilled therapeutic intervention in order to improve the following deficits and impairments:  Abnormal gait, Hypomobility, Impaired sensation, Pain, Difficulty walking, Decreased mobility, Decreased activity tolerance, Decreased range of motion, Decreased skin integrity, Decreased strength, Increased edema, Increased muscle spasms, Impaired  flexibility  Visit Diagnosis: Acute pain of right knee  Stiffness of right knee, not elsewhere classified  Other abnormalities of gait and mobility     Problem List Patient Active Problem List   Diagnosis Date Noted  . S/P total knee arthroplasty, right 01/08/2018  . Primary localized osteoarthritis of right knee 01/03/2018  . Spinal stenosis of lumbar region 05/30/2017  . Postoperative anemia due to acute blood loss 10/11/2015  . Arthritis 10/11/2015  . Constipation 10/11/2015  . Headache 10/11/2015  . Osteoarthritis of left knee 10/07/2015  . Obesity 10/07/2015  . S/P total knee replacement using cement 10/07/2015    Kermit BaloMicah Masa Lubin, PTA 02/16/18 6:27 PM   [facility] [address]  [address] [place], [place], [address] Phone: [phone]   Fax:  [phone]  Name: Benson SettingSusan J Limpert MRN: [id number] Date of Birth: 29-Mar-1951

## 2018-02-20 ENCOUNTER — Ambulatory Visit: Payer: BLUE CROSS/BLUE SHIELD | Admitting: Physical Therapy

## 2018-02-20 DIAGNOSIS — M25561 Pain in right knee: Secondary | ICD-10-CM

## 2018-02-20 DIAGNOSIS — R2689 Other abnormalities of gait and mobility: Secondary | ICD-10-CM

## 2018-02-20 DIAGNOSIS — M25661 Stiffness of right knee, not elsewhere classified: Secondary | ICD-10-CM

## 2018-02-20 NOTE — Therapy (Signed)
[facility] Outpatient Rehabilitation [facility] [address]  [address] [place], [place], [address] Phone: [phone]   Fax:  [phone]  Physical Therapy Treatment  Patient Details  Name: Lauren CONRADT MRN: [id number] Date of Birth: 1950/07/22 Referring Provider: [name], MD   Encounter Date: 02/20/2018  PT End of Session - 02/20/18 1400    Visit Number  10    Number of Visits  18    Date for PT Re-Evaluation  03/22/18    PT Start Time  1400    PT Stop Time  1456    PT Time Calculation (min)  56 min    Activity Tolerance  Patient tolerated treatment well    Behavior During Therapy  [facility] for tasks assessed/performed       Past Medical History:  Diagnosis Date  . Arthritis   . Depression    denies  . Headache    migraines    Past Surgical History:  Procedure Laterality Date  . ABDOMINAL HYSTERECTOMY    . ANKLE FUSION    . BOWEL RESECTION    . KNEE ARTHROSCOPY    . KNEE SURGERY Bilateral    LEFT ARTHROCOPY  X3    ,  RT X1  . TONSILLECTOMY    . TOTAL KNEE ARTHROPLASTY Left 10/07/2015  . TOTAL KNEE ARTHROPLASTY Left 10/07/2015   Procedure: TOTAL KNEE ARTHROPLASTY;  Surgeon: Valeria Batman, MD;  Location: [facility] OR;  Service: Orthopedics;  Laterality: Left;  . TOTAL KNEE ARTHROPLASTY Right 01/03/2018  . TOTAL KNEE ARTHROPLASTY Right 01/03/2018   Procedure: RIGHT TOTAL KNEE ARTHROPLASTY;  Surgeon: Valeria Batman, MD;  Location: [facility] OR;  Service: Orthopedics;  Laterality: Right;    There were no vitals filed for this visit.  Subjective Assessment - 02/20/18 1401    Subjective  Pt stating "my knee is not behaving today". Feels like the bridges from last session was the trigger for the flare-up of her knee pain as well as her back pain - refuses to do bridges again in future sessions stating she "will not return to PT if she has to do bridges".    Pertinent History  L TKA (10/2015)    Patient Stated Goals  To keep the knee moving    Currently in Pain?  Yes    Pain Score  5     Pain Location  Knee    Pain Orientation  Right    Pain Onset  More than a month ago         [facility] PT Assessment - 02/20/18 1400      Assessment   Medical Diagnosis  Chronic Pain of L knee s/p L TKR    Referring Provider  [name], MD    Onset Date/Surgical Date  01/03/18    Next MD Visit  02/25/2015                   [facility] Adult PT Treatment/Exercise - 02/20/18 1400      Exercises   Exercises  Knee/Hip      Knee/Hip Exercises: Aerobic   Recumbent Bike  L2 x 6 min      Knee/Hip Exercises: Standing   Terminal Knee Extension  Right;20 reps;Theraband    Theraband Level (Terminal Knee Extension)  Level 4 (Blue)    Terminal Knee Extension Limitations  5" hold     Hip Abduction  Right;Left;15 reps    Abduction Limitations  Fitter hip abduction - 1 black  band, 2 pole A    Hip Extension  Right;Left;15 reps;Stengthening    Extension Limitations  Fitter hip extension - 1 black band, 2 pole A      Knee/Hip Exercises: Seated   Hamstring Curl  Right;15 reps    Hamstring Limitations  red TB      Modalities   Modalities  Vasopneumatic      Vasopneumatic   Number Minutes Vasopneumatic   10 minutes    Vasopnuematic Location   Knee    Vasopneumatic Pressure  High    Vasopneumatic Temperature   Lowest      Manual Therapy   Manual Therapy  Soft tissue mobilization;Myofascial release    Manual therapy comments  L sidelying    Soft tissue mobilization  R lateral HS - poor tolerance initially, much imporved after DN    Myofascial Release  limited tolerance to manual TPR initially but good tolerance to MFR along length of R lateral HS after DN       Trigger Point Dry Needling - 02/20/18 1400    Consent Given?  Yes    Muscles Treated Lower Body  Hamstring    Hamstring Response  Twitch response elicited;Palpable increased muscle length   R lateral HS            PT Short Term Goals - 02/08/18 1455      PT  SHORT TERM GOAL #1   Title  Pt will be independent with initial HEP     Status  Achieved      PT SHORT TERM GOAL #2   Title  Pt will ambulate with normal gait pattern with SPC     Status  Achieved        PT Long Term Goals - 02/20/18 1406      PT LONG TERM GOAL #1   Title  Pt will be independent with advanced HEP program     Status  On-going      PT LONG TERM GOAL #2   Title  Pt will demonstrate R knee AROM 0-120 or greater    Status  On-going      PT LONG TERM GOAL #3   Title  Pt will report pain no greater than 4/10 at worst    Status  On-going      PT LONG TERM GOAL #4   Title  Pt will ascend/descend 1 flight of stairs with normal gait pattern and LRAD to improve functional mobility     Status  On-going      PT LONG TERM GOAL #5   Title  Pt will report abilty to garden with no limitations or increase in pain    Status  On-going      PT LONG TERM GOAL #6   Title  Pt will have 5/5 global R LE strength to improve ability to perform functional activities including climbing stairs    Status  On-going            Plan - 02/20/18 1407    Clinical Impression Statement  Darl PikesSusan reporting a bad weekend due to increased R posterior/lateral knee pain following last therapy session which she attributes to the bridges performed last session. Pt very TTP over R lateral HS to the degree that pt unable to initially tolerate manual therapy to this area, but able to get good relief with DN to the point where the pt was able to tolerate manual STM and MFR - pt reporting no pain in HS following  DN and manual therapy with good tolerance for therapeutic exercises during remainder of session. Visit concluded with vasopneumatic compression to promote decreased swelling and further reduction in pain.    Rehab Potential  Good    PT Treatment/Interventions  ADLs/Self Care Home Management;Cryotherapy;Electrical Stimulation;Moist Heat;Ultrasound;DME Instruction;Gait training;Stair  training;Functional mobility training;Therapeutic activities;Therapeutic exercise;Iontophoresis 4mg /ml Dexamethasone;Balance training;Neuromuscular re-education;Patient/family education;Manual techniques;Scar mobilization;Passive range of motion;Dry needling;Taping;Vasopneumatic Device    PT Next Visit Plan  FOTO & MD progress note    Consulted and Agree with Plan of Care  Patient       Patient will benefit from skilled therapeutic intervention in order to improve the following deficits and impairments:  Abnormal gait, Hypomobility, Impaired sensation, Pain, Difficulty walking, Decreased mobility, Decreased activity tolerance, Decreased range of motion, Decreased skin integrity, Decreased strength, Increased edema, Increased muscle spasms, Impaired flexibility  Visit Diagnosis: Acute pain of right knee  Stiffness of right knee, not elsewhere classified  Other abnormalities of gait and mobility     Problem List Patient Active Problem List   Diagnosis Date Noted  . S/P total knee arthroplasty, right 01/08/2018  . Primary localized osteoarthritis of right knee 01/03/2018  . Spinal stenosis of lumbar region 05/30/2017  . Postoperative anemia due to acute blood loss 10/11/2015  . Arthritis 10/11/2015  . Constipation 10/11/2015  . Headache 10/11/2015  . Osteoarthritis of left knee 10/07/2015  . Obesity 10/07/2015  . S/P total knee replacement using cement 10/07/2015    Marry GuanJoAnne M Kreis, PT, MPT 02/20/2018, 6:27 PM  [facility] [address]  [address] [place], [place], [address] Phone: [phone]   Fax:  [phone]  Name: Benson SettingSusan J Porr MRN: [id number] Date of Birth: [birth year]

## 2018-02-23 ENCOUNTER — Ambulatory Visit: Payer: BLUE CROSS/BLUE SHIELD

## 2018-02-23 ENCOUNTER — Telehealth (INDEPENDENT_AMBULATORY_CARE_PROVIDER_SITE_OTHER): Payer: Self-pay | Admitting: Orthopaedic Surgery

## 2018-02-23 ENCOUNTER — Other Ambulatory Visit (INDEPENDENT_AMBULATORY_CARE_PROVIDER_SITE_OTHER): Payer: Self-pay | Admitting: Radiology

## 2018-02-23 DIAGNOSIS — M25561 Pain in right knee: Secondary | ICD-10-CM | POA: Diagnosis not present

## 2018-02-23 DIAGNOSIS — R2689 Other abnormalities of gait and mobility: Secondary | ICD-10-CM

## 2018-02-23 DIAGNOSIS — M25661 Stiffness of right knee, not elsewhere classified: Secondary | ICD-10-CM

## 2018-02-23 MED ORDER — METHOCARBAMOL 500 MG PO TABS: ORAL_TABLET | ORAL | 0 refills | Status: DC

## 2018-02-23 NOTE — Telephone Encounter (Signed)
SENT IN NEW RX

## 2018-02-23 NOTE — Telephone Encounter (Signed)
Please advise 

## 2018-02-23 NOTE — Therapy (Signed)
[facility] [address]  [place], [place], [address] Phone: [phone]   Fax:  [phone]  Physical Therapy Treatment  Patient Details  Name: Lauren Frye MRN: [id number] Date of Birth: 06-28-1951 Referring Provider: Joni Fears, MD   Encounter Date: 02/23/2018  PT End of Session - 02/23/18 1416    Visit Number  11    Number of Visits  18    Date for PT Re-Evaluation  03/22/18    PT Start Time  1401    PT Stop Time  1455    PT Time Calculation (min)  54 min    Activity Tolerance  Patient tolerated treatment well    Behavior During Therapy  [facility] for tasks assessed/performed       Past Medical History:  Diagnosis Date  . Arthritis   . Depression    denies  . Headache    migraines    Past Surgical History:  Procedure Laterality Date  . ABDOMINAL HYSTERECTOMY    . ANKLE FUSION    . BOWEL RESECTION    . KNEE ARTHROSCOPY    . KNEE SURGERY Bilateral    LEFT ARTHROCOPY  X3    ,  RT X1  . TONSILLECTOMY    . TOTAL KNEE ARTHROPLASTY Left 10/07/2015  . TOTAL KNEE ARTHROPLASTY Left 10/07/2015   Procedure: TOTAL KNEE ARTHROPLASTY;  Surgeon: Garald Balding, MD;  Location: [place];  Service: Orthopedics;  Laterality: Left;  . TOTAL KNEE ARTHROPLASTY Right 01/03/2018  . TOTAL KNEE ARTHROPLASTY Right 01/03/2018   Procedure: RIGHT TOTAL KNEE ARTHROPLASTY;  Surgeon: Garald Balding, MD;  Location: [place];  Service: Orthopedics;  Laterality: Right;    There were no vitals filed for this visit.  Subjective Assessment - 02/23/18 1819    Subjective  Noting some improvement in pain levels.      Pertinent History  L TKA (10/2015)    Patient Stated Goals  To keep the knee moving    Currently in Pain?  Yes    Pain Score  3     Pain Location  Knee    Pain Orientation  Right    Pain Descriptors / Indicators  Aching    Pain Type  Surgical pain    Pain Onset  More than a month ago    Pain Frequency  Intermittent     Multiple Pain Sites  No         [facility] PT Assessment - 02/23/18 1410      AROM   Right/Left Knee  Right    Right Knee Extension  0   0 dg quad lag with SLR   Right Knee Flexion  106      Strength   Strength Assessment Site  Knee;Hip    Right/Left Hip  Right;Left    Right Hip Flexion  4+/5    Right Hip Extension  4/5    Right Hip External Rotation   4/5    Right Hip Internal Rotation  4/5    Right Hip ABduction  4+/5    Right Hip ADduction  4/5    Left Hip Flexion  4+/5    Left Hip Extension  4+/5    Left Hip External Rotation  4+/5    Left Hip Internal Rotation  4+/5    Left Hip ABduction  4+/5    Left Hip ADduction  4+/5    Right/Left Knee  Right;Left    Right Knee  Flexion  4+/5    Right Knee Extension  5/5    Left Knee Flexion  5/5    Left Knee Extension  5/5                   [facility] Adult PT Treatment/Exercise - 02/23/18 1415      Ambulation/Gait   Stairs  Yes    Stair Management Technique  One rail Right;Alternating pattern    Number of Stairs  14    Height of Stairs  8    Gait Comments  Pt. able to ascend/descend stairs x 14 steps with min rail use reciprocally and with some visible R quad instability and report of mild R knee pain 3/10      Knee/Hip Exercises: Aerobic   Recumbent Bike  L2 x 6 min      Knee/Hip Exercises: Standing   Hip Flexion  Both;10 reps;Knee straight    Hip Flexion Limitations  Red TB at ankle; 2 ski poles     Hip ADduction  Right;Left;10 reps;Strengthening    Hip ADduction Limitations  red TB at ankle; 2 ski poles     Hip Abduction  Right;Left;10 reps;Knee straight    Abduction Limitations  red TB at ankle; 2 ski poles     Hip Extension  Right;Left;10 reps;Knee straight;Stengthening    Extension Limitations  Red TB at ankle; 2 ski poles       Knee/Hip Exercises: Supine   Straight Leg Raises  Right;15 reps;Strengthening      Vasopneumatic   Number Minutes Vasopneumatic   10 minutes    Vasopnuematic Location   Knee     Vasopneumatic Pressure  High    Vasopneumatic Temperature   Lowest               PT Short Term Goals - 02/08/18 1455      PT SHORT TERM GOAL #1   Title  Pt will be independent with initial HEP     Status  Achieved      PT SHORT TERM GOAL #2   Title  Pt will ambulate with normal gait pattern with SPC     Status  Achieved        PT Long Term Goals - 02/23/18 1413      PT LONG TERM GOAL #1   Title  Pt will be independent with advanced HEP program     Status  Partially Met   met for current      PT LONG TERM GOAL #2   Title  Pt will demonstrate R knee AROM 0-120 or greater    Status  Partially Met   0-106 dg      PT LONG TERM GOAL #3   Title  Pt will report pain no greater than 4/10 at worst    Status  On-going   Pt. noting R knee pain at worst of 6/10      PT LONG TERM GOAL #4   Title  Pt will ascend/descend 1 flight of stairs with normal gait pattern and LRAD to improve functional mobility     Status  Partially Met   Still with visible quad weakness on descending however now step-over-step with light rail use      PT LONG TERM GOAL #5   Title  Pt will report abilty to garden with no limitations or increase in pain    Status  On-going   Has not attempted due to hot weather  PT LONG TERM GOAL #6   Title  Pt will have 5/5 global R LE strength to improve ability to perform functional activities including climbing stairs    Status  Partially Met   met for R quad 5/5           Plan - 02/23/18 1430    Clinical Impression Statement  Pt. R knee AROM progressing to 0-106 dg today partially meeting goal.  Pt. demonstrating improved in LE strength with MMT today partially meeting goal with remaining deficits in R hip extension, IR, ER, and adduction still 4/5.  Pt. now navigating stairs with reciprocal pattern and only slight R quad instability on descending with report of mild R knee pain.  Pt. greatest complaint at this point is occasional R lateral/medial  knee catching pain, which seems to be less frequent over past few weeks.  Pt. has not yet returned to gardening due to weather and still noting R knee pain rising to 4/72 at worst with certain movements.  Lauren Frye verbalized that she feels she is improving with therapy and is on track to meet remaining goals in POC.      PT Treatment/Interventions  ADLs/Self Care Home Management;Cryotherapy;Electrical Stimulation;Moist Heat;Ultrasound;DME Instruction;Gait training;Stair training;Functional mobility training;Therapeutic activities;Therapeutic exercise;Iontophoresis 59m/ml Dexamethasone;Balance training;Neuromuscular re-education;Patient/family education;Manual techniques;Scar mobilization;Passive range of motion;Dry needling;Taping;Vasopneumatic Device    Consulted and Agree with Plan of Care  Patient       Patient will benefit from skilled therapeutic intervention in order to improve the following deficits and impairments:  Abnormal gait, Hypomobility, Impaired sensation, Pain, Difficulty walking, Decreased mobility, Decreased activity tolerance, Decreased range of motion, Decreased skin integrity, Decreased strength, Increased edema, Increased muscle spasms, Impaired flexibility  Visit Diagnosis: Acute pain of right knee  Stiffness of right knee, not elsewhere classified  Other abnormalities of gait and mobility     Problem List Patient Active Problem List   Diagnosis Date Noted  . S/P total knee arthroplasty, right 01/08/2018  . Primary localized osteoarthritis of right knee 01/03/2018  . Spinal stenosis of lumbar region 05/30/2017  . Postoperative anemia due to acute blood loss 10/11/2015  . Arthritis 10/11/2015  . Constipation 10/11/2015  . Headache 10/11/2015  . Osteoarthritis of left knee 10/07/2015  . Obesity 10/07/2015  . S/P total knee replacement using cement 10/07/2015    MBess Harvest PTA 02/24/18 9:00 AM    [facility] [place] [address]Phone: [phone]  Fax:  [phone] Name: Lauren BODENHEIMERMRN: [id number] of Birth: 104-Mar-1952

## 2018-02-23 NOTE — Telephone Encounter (Signed)
Patient called requesting a prescription ref[facility] of [facility] [address] [place]  Patient states she is out of pills.  Patient states she only received 30 pills with her last prescription and she takes 3 per day.

## 2018-02-23 NOTE — Telephone Encounter (Signed)
Ok to renew-prescribe 60

## 2018-02-24 ENCOUNTER — Ambulatory Visit (INDEPENDENT_AMBULATORY_CARE_PROVIDER_SITE_OTHER): Payer: BLUE CROSS/BLUE SHIELD | Admitting: Orthopaedic Surgery

## 2018-02-24 ENCOUNTER — Telehealth (INDEPENDENT_AMBULATORY_CARE_PROVIDER_SITE_OTHER): Payer: Self-pay | Admitting: Orthopaedic Surgery

## 2018-02-24 ENCOUNTER — Encounter (INDEPENDENT_AMBULATORY_CARE_PROVIDER_SITE_OTHER): Payer: Self-pay | Admitting: Orthopaedic Surgery

## 2018-02-24 VITALS — BP 118/78 | HR 96 | Ht 66.0 in | Wt 220.0 lb

## 2018-02-24 DIAGNOSIS — Z96651 Presence of right artificial knee joint: Secondary | ICD-10-CM

## 2018-02-24 MED ORDER — DICLOFENAC EPOLAMINE 1.3 % TD PTCH: 1.0000 | MEDICATED_PATCH | Freq: Two times a day (BID) | TRANSDERMAL | 0 refills | Status: DC | PRN

## 2018-02-24 NOTE — Telephone Encounter (Signed)
Patient here today for appt. Patient states [facility] did not have her medication rx robaxin last night. Please resend.

## 2018-02-24 NOTE — Telephone Encounter (Signed)
SPOKE WITH PHARMACIST YESTERDAY AND NOTIFIED PT THIS AM

## 2018-02-24 NOTE — Progress Notes (Signed)
Office Visit Note   Patient: Lauren Frye           Date of Birth: Nov 11, 1950           MRN: [id number] Visit Date: 02/24/2018              Requested by: No referring provider defined for this encounter. PCP: Patient, No Pcp Per   Assessment & Plan: Visit Diagnoses:  1. History of total right knee replacement     Plan: 2 months status post primary right total knee replacement.  Still having some issues with weakness and uses her cane.  I would like her to continue with her exercises.  I will apply samples of Flector patches and reevaluate in 1 month.  I would still consider her disabled.  I think the biggest issue is weakness that over time and with further exercises that she will continue to improve.  Follow-Up Instructions: Return in about 1 month (around 03/27/2018).   Orders:  No orders of the defined types were placed in this encounter.  Meds ordered this encounter  Medications  . diclofenac (FLECTOR) 1.3 % PTCH    Sig: Place 1 patch onto the skin 2 (two) times daily as needed (Apply patch to right knee every 12 hours prn).    Dispense:  4 patch    Refill:  0      Procedures: No procedures performed   Clinical Data: No additional findings.   Subjective: Chief Complaint  Patient presents with  . Follow-up    R KNEE ARTHROSCOPY SURGERY IN JAN/2019 STILL HAVING NUMBNESS, WEAKNESS AND SWELLING AND HAS KNOT ON AREA OF KNEE. HAVING TROUBLE SLEEPING  Right total knee replacement 2 months ago.  No related fever chills shortness of breath.  Still having some issues with discomfort of her right leg at the end of the day.  Has been to physical therapy with full extension and flexion to at least 106 degrees.  Still using a cane.  He has localized distal quadriceps rather than in the knee.  HPI  Review of Systems  Constitutional: Positive for fatigue. Negative for fever.  HENT: Negative for ear pain.   Eyes: Negative for pain.  Respiratory: Negative for cough and  shortness of breath.   Cardiovascular: Positive for leg swelling.  Gastrointestinal: Negative for constipation and diarrhea.  Genitourinary: Negative for difficulty urinating.  Musculoskeletal: Positive for back pain. Negative for neck pain.  Skin: Negative for rash.  Neurological: Positive for weakness and numbness.  Hematological: Bruises/bleeds easily.  Psychiatric/Behavioral: Positive for sleep disturbance.     Objective: Vital Signs: BP 118/78 (BP Location: Left Arm, Patient Position: Sitting, Cuff Size: Normal)   Pulse 96   Ht 5\' 6"  (1.676 m)   Wt 220 lb (99.8 kg)   BMI 35.51 kg/m   Physical Exam  Constitutional: She is oriented to person, place, and time. She appears well-developed and well-nourished.  HENT:  Mouth/Throat: Oropharynx is clear and moist.  Eyes: Pupils are equal, round, and reactive to light. EOM are normal.  Pulmonary/Chest: Effort normal.  Neurological: She is alert and oriented to person, place, and time.  Skin: Skin is warm and dry.  Psychiatric: She has a normal mood and affect. Her behavior is normal.    Ortho Exam right knee incision healing without problem.  No opening with varus or valgus stress.  Full quick extension.  No obvious effusion.  Knee was not hot red or swollen.  Flexed about 105 degrees with  a goniometer.  No popliteal mass.  No pain in the right calf.  No distal edema.  Neurovascular exam intact.  Painless range of motion right hip. no significant localized area of tenderness  Specialty Comments:  No specialty comments available.  Imaging: No results found.   PMFS History: Patient Active Problem List   Diagnosis Date Noted  . S/P total knee arthroplasty, right 01/08/2018  . Primary localized osteoarthritis of right knee 01/03/2018  . Spinal stenosis of lumbar region 05/30/2017  . Postoperative anemia due to acute blood loss 10/11/2015  . Arthritis 10/11/2015  . Constipation 10/11/2015  . Headache 10/11/2015  .  Osteoarthritis of left knee 10/07/2015  . Obesity 10/07/2015  . 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 Arthritis   . Depression    denies  . Headache    migraines    Family History  Problem Relation Age of Onset  . Parkinson's disease Mother   . COPD Mother   . Diabetes Sister     Past Surgical History:  Procedure Laterality Date  . ABDOMINAL HYSTERECTOMY    . ANKLE FUSION    . BOWEL RESECTION    . KNEE ARTHROSCOPY    . KNEE SURGERY Bilateral    LEFT ARTHROCOPY  X3    ,  RT X1  . TONSILLECTOMY    . TOTAL KNEE ARTHROPLASTY Left 10/07/2015  . TOTAL KNEE ARTHROPLASTY Left 10/07/2015   Procedure: TOTAL KNEE ARTHROPLASTY;  Surgeon: Valeria Batman, MD;  Location: [facility] OR;  Service: Orthopedics;  Laterality: Left;  . TOTAL KNEE ARTHROPLASTY Right 01/03/2018  . TOTAL KNEE ARTHROPLASTY Right 01/03/2018   Procedure: RIGHT TOTAL KNEE ARTHROPLASTY;  Surgeon: Valeria Batman, MD;  Location: [facility] OR;  Service: Orthopedics;  Laterality: Right;   Social History   Occupational History  . Not on file  Tobacco Use  . Smoking status: Never Smoker  . Smokeless tobacco: Never Used  Substance and Sexual Activity  . Alcohol use: Yes    Alcohol/week: 1.0 standard drinks    Types: 1 Glasses of wine per week    Comment: ONCE A week  . Drug use: No  . Sexual activity: Not Currently    Birth control/protection: Abstinence

## 2018-02-27 ENCOUNTER — Ambulatory Visit: Payer: BLUE CROSS/BLUE SHIELD | Admitting: Physical Therapy

## 2018-02-27 ENCOUNTER — Encounter: Payer: Self-pay | Admitting: Physical Therapy

## 2018-02-27 DIAGNOSIS — M25661 Stiffness of right knee, not elsewhere classified: Secondary | ICD-10-CM

## 2018-02-27 DIAGNOSIS — R2689 Other abnormalities of gait and mobility: Secondary | ICD-10-CM

## 2018-02-27 DIAGNOSIS — M25561 Pain in right knee: Secondary | ICD-10-CM

## 2018-02-27 NOTE — Therapy (Signed)
[facility] [address]  [place], [place], [address] Phone: [phone]   Fax:  [phone]  Physical Therapy Treatment  Patient Details  Name: Lauren Frye MRN: [id number] Date of Birth: 27-May-1951 Referring Provider: Joni Fears, MD   Encounter Date: 02/27/2018  PT End of Session - 02/27/18 1400    Visit Number  12    Number of Visits  18    Date for PT Re-Evaluation  03/22/18    Authorization Type  BCBS    PT Start Time  1400    PT Stop Time  1450    PT Time Calculation (min)  50 min    Activity Tolerance  Patient tolerated treatment well    Behavior During Therapy  [facility] for tasks assessed/performed       Past Medical History:  Diagnosis Date  . Arthritis   . Depression    denies  . Headache    migraines    Past Surgical History:  Procedure Laterality Date  . ABDOMINAL HYSTERECTOMY    . ANKLE FUSION    . BOWEL RESECTION    . KNEE ARTHROSCOPY    . KNEE SURGERY Bilateral    LEFT ARTHROCOPY  X3    ,  RT X1  . TONSILLECTOMY    . TOTAL KNEE ARTHROPLASTY Left 10/07/2015  . TOTAL KNEE ARTHROPLASTY Left 10/07/2015   Procedure: TOTAL KNEE ARTHROPLASTY;  Surgeon: Garald Balding, MD;  Location: [facility];  Service: Orthopedics;  Laterality: Left;  . TOTAL KNEE ARTHROPLASTY Right 01/03/2018  . TOTAL KNEE ARTHROPLASTY Right 01/03/2018   Procedure: RIGHT TOTAL KNEE ARTHROPLASTY;  Surgeon: Garald Balding, MD;  Location: [facility];  Service: Orthopedics;  Laterality: Right;    There were no vitals filed for this visit.  Subjective Assessment - 02/27/18 1402    Subjective  Saw MD on 02/24/18 - states he was please with her progress but wrote her out of work for another month at her request. Also started her on patches to help with the ongoing inflammation/swelling in her knee.    Pertinent History  L TKA (10/2015)    Patient Stated Goals  To keep the knee moving    Currently in Pain?  Yes    Pain Score  4      Pain Location  Knee    Pain Orientation  Right    Pain Descriptors / Indicators  Tightness    Pain Type  Surgical pain         [facility] PT Assessment - 02/27/18 1400      Assessment   Medical Diagnosis  Chronic Pain of R knee s/p R TKR    Referring Provider  Joni Fears, MD    Onset Date/Surgical Date  01/03/18    Next MD Visit  03/27/18                   [facility] Adult PT Treatment/Exercise - 02/27/18 1400      Exercises   Exercises  Knee/Hip      Knee/Hip Exercises: Stretches   Quad Stretch  Right;30 seconds;3 Lexicographer Limitations  prone with strap, hip extended with lower thigh on rolled yoga mat    Gastroc Stretch  Right;30 seconds;3 reps    Gastroc Stretch Limitations  standing at wall      Knee/Hip Exercises: Aerobic   Recumbent Bike  L2 x 6 min      Knee/Hip  Exercises: Standing   Forward Lunges  Right;Left;10 reps;3 seconds   each   Forward Lunges Limitations  back of chair for support    Step Down  Right;15 reps;Step Height: 6";Hand Hold: 2    Step Down Limitations  eccentric lowering with L heel touch - cues for posterior weight shift to avoid knee in front of toes    SLS with Vectors  R/L SLS on blue foam oval with opp toe touch to 3 balance pebbles x 5 revolutions, 1 pole A for balance    Other Standing Knee Exercises  B side-stepping & fwd/back monster walk with red TB at ankles 2 x 51f      Vasopneumatic   Number Minutes Vasopneumatic   10 minutes   sitting in chair as pt w/ limited tolerance for supine   Vasopnuematic Location   Knee    Vasopneumatic Pressure  High    Vasopneumatic Temperature   Lowest               PT Short Term Goals - 02/08/18 1455      PT SHORT TERM GOAL #1   Title  Pt will be independent with initial HEP     Status  Achieved      PT SHORT TERM GOAL #2   Title  Pt will ambulate with normal gait pattern with SPC     Status  Achieved        PT Long Term Goals - 02/23/18 1413      PT LONG  TERM GOAL #1   Title  Pt will be independent with advanced HEP program     Status  Partially Met   met for current      PT LONG TERM GOAL #2   Title  Pt will demonstrate R knee AROM 0-120 or greater    Status  Partially Met   0-106 dg      PT LONG TERM GOAL #3   Title  Pt will report pain no greater than 4/10 at worst    Status  On-going   Pt. noting R knee pain at worst of 6/10      PT LONG TERM GOAL #4   Title  Pt will ascend/descend 1 flight of stairs with normal gait pattern and LRAD to improve functional mobility     Status  Partially Met   Still with visible quad weakness on descending however now step-over-step with light rail use      PT LONG TERM GOAL #5   Title  Pt will report abilty to garden with no limitations or increase in pain    Status  On-going   Has not attempted due to hot weather      PT LONG TERM GOAL #6   Title  Pt will have 5/5 global R LE strength to improve ability to perform functional activities including climbing stairs    Status  Partially Met   met for R quad 5/5           Plan - 02/27/18 1406    Clinical Impression Statement  SManuela Schwartzreporting MD pleased with her progress but delayed her return to work at her request. Therapeutic exercises targeting proximal LE strengthening and R knee stretching/ROM wtih fatigue noted requiring frequent rest breaks.    Rehab Potential  Good    PT Treatment/Interventions  ADLs/Self Care Home Management;Cryotherapy;Electrical Stimulation;Moist Heat;Ultrasound;DME Instruction;Gait training;Stair training;Functional mobility training;Therapeutic activities;Therapeutic exercise;Iontophoresis 474mml Dexamethasone;Balance training;Neuromuscular re-education;Patient/family education;Manual techniques;Scar mobilization;Passive range of motion;Dry needling;Taping;Vasopneumatic  Device    Consulted and Agree with Plan of Care  Patient       Patient will benefit from skilled therapeutic intervention in order to improve  the following deficits and impairments:  Abnormal gait, Hypomobility, Impaired sensation, Pain, Difficulty walking, Decreased mobility, Decreased activity tolerance, Decreased range of motion, Decreased skin integrity, Decreased strength, Increased edema, Increased muscle spasms, Impaired flexibility  Visit Diagnosis: Acute pain of right knee  Stiffness of right knee, not elsewhere classified  Other abnormalities of gait and mobility     Problem List Patient Active Problem List   Diagnosis Date Noted  . S/P total knee arthroplasty, right 01/08/2018  . Primary localized osteoarthritis of right knee 01/03/2018  . Spinal stenosis of lumbar region 05/30/2017  . Postoperative anemia due to acute blood loss 10/11/2015  . Arthritis 10/11/2015  . Constipation 10/11/2015  . Headache 10/11/2015  . Osteoarthritis of left knee 10/07/2015  . Obesity 10/07/2015  . S/P total knee replacement using cement 10/07/2015    Percival Spanish, PT, MPT 02/27/2018, 3:47 PM  [facility] [address]  [place], [place], [address] Phone: [phone]   Fax:  [phone]  Name: Lauren Frye MRN: [id number] Date of Birth: 1950/09/21

## 2018-02-28 ENCOUNTER — Telehealth (INDEPENDENT_AMBULATORY_CARE_PROVIDER_SITE_OTHER): Payer: Self-pay | Admitting: *Deleted

## 2018-02-28 ENCOUNTER — Telehealth (INDEPENDENT_AMBULATORY_CARE_PROVIDER_SITE_OTHER): Payer: Self-pay | Admitting: Orthopaedic Surgery

## 2018-02-28 NOTE — Telephone Encounter (Signed)
NOTIFIED PT THAT OUR FAX MACHINE WAS NOT WORKING 02/24/18 AND 02/27/18 DUE TO THE THUNDERSTORMS, BUT WAS UP WORKING AND TO PLEASE REFAX PAPERWORK. PT STATED SHE WOULD GET IT REFAXED TO [employer]

## 2018-02-28 NOTE — Telephone Encounter (Signed)
Yes, the form and the check are at the front desk for pick up.

## 2018-02-28 NOTE — Telephone Encounter (Signed)
Received fax and sent to [employer] to be completed. Notified pt we received fax and sent to [employer] to fill out and send back to [place]

## 2018-02-28 NOTE — Telephone Encounter (Signed)
Patient called stating that [facility] faxed paperwork for her FMLA extension on Friday, 02/24/18 and again on Monday 02/27/18.  Patient states the paperwork needs to be filled out by Dr. Cleophas DunkerWhitfield giving the reason for patient to be out longer than her approved time.  Patient states she was due to go back to work on 8/26 but Dr. Cleophas DunkerWhitfield extended her leave for another month.  Patient is requesting a return call.

## 2018-02-28 NOTE — Telephone Encounter (Signed)
Thank you,  I talked to her today, did she sign the authorization form?

## 2018-02-28 NOTE — Telephone Encounter (Signed)
Disability paper work completed and at front desk with check, Lauren Frye is aware

## 2018-02-28 NOTE — Telephone Encounter (Signed)
Could you please complete patient's disability paper work ASAP if possible? Patient's disability has been extended. Patient is having a difficult time reach you. Thank you.

## 2018-03-02 ENCOUNTER — Ambulatory Visit: Payer: BLUE CROSS/BLUE SHIELD

## 2018-03-02 DIAGNOSIS — R2689 Other abnormalities of gait and mobility: Secondary | ICD-10-CM

## 2018-03-02 DIAGNOSIS — M25661 Stiffness of right knee, not elsewhere classified: Secondary | ICD-10-CM

## 2018-03-02 DIAGNOSIS — M25561 Pain in right knee: Secondary | ICD-10-CM | POA: Diagnosis not present

## 2018-03-02 NOTE — Therapy (Signed)
[facility] [address]  [place], [place], [address] Phone: [phone]   Fax:  [phone]  Physical Therapy Treatment  Patient Details  Name: Lauren Frye MRN: [id number] Date of Birth: Dec 03, 1950 Referring Provider: Joni Fears, MD   Encounter Date: 03/02/2018  PT End of Session - 03/02/18 1104    Visit Number  13    Number of Visits  18    Date for PT Re-Evaluation  03/22/18    Authorization Type  BCBS    PT Start Time  1057    PT Stop Time  1154    PT Time Calculation (min)  57 min    Activity Tolerance  Patient tolerated treatment well    Behavior During Therapy  [facility] for tasks assessed/performed       Past Medical History:  Diagnosis Date  . Arthritis   . Depression    denies  . Headache    migraines    Past Surgical History:  Procedure Laterality Date  . ABDOMINAL HYSTERECTOMY    . ANKLE FUSION    . BOWEL RESECTION    . KNEE ARTHROSCOPY    . KNEE SURGERY Bilateral    LEFT ARTHROCOPY  X3    ,  RT X1  . TONSILLECTOMY    . TOTAL KNEE ARTHROPLASTY Left 10/07/2015  . TOTAL KNEE ARTHROPLASTY Left 10/07/2015   Procedure: TOTAL KNEE ARTHROPLASTY;  Surgeon: Garald Balding, MD;  Location: [facility];  Service: Orthopedics;  Laterality: Left;  . TOTAL KNEE ARTHROPLASTY Right 01/03/2018  . TOTAL KNEE ARTHROPLASTY Right 01/03/2018   Procedure: RIGHT TOTAL KNEE ARTHROPLASTY;  Surgeon: Garald Balding, MD;  Location: [facility];  Service: Orthopedics;  Laterality: Right;    There were no vitals filed for this visit.  Subjective Assessment - 03/02/18 1101    Subjective  Pt. noting some LBP on Tuesday and subsided Wednesday.      Pertinent History  L TKA (10/2015)    Patient Stated Goals  To keep the knee moving    Currently in Pain?  Yes    Pain Score  2     Pain Location  Knee    Pain Orientation  Right;Lateral    Pain Descriptors / Indicators  Tightness    Pain Type  Surgical pain    Multiple  Pain Sites  Yes    Pain Score  4    Pain Location  Back    Pain Orientation  Lower    Pain Descriptors / Indicators  Sharp;Stabbing                       [facility] Adult PT Treatment/Exercise - 03/02/18 1108      Knee/Hip Exercises: Stretches   Quad Stretch  Right;1 rep;60 seconds    Quad Stretch Limitations  prone with strap, hip extended with lower thigh on rolled yoga mat      Knee/Hip Exercises: Aerobic   Recumbent Bike  L2 x 6 min      Knee/Hip Exercises: Machines for Strengthening   Cybex Knee Flexion  B con/R ecc 20# x 10 rpes     Cybex Leg Press  B LE's; 35#; 15 reps      Knee/Hip Exercises: Standing   Forward Step Up  10 reps;Step Height: 8";Right;Hand Hold: 1    Forward Step Up Limitations  red TB TKE     Functional Squat  15 reps;3 seconds  Functional Squat Limitations  TRX    Wall Squat  15 reps;5 seconds    Wall Squat Limitations  leaning on orange p-ball       Knee/Hip Exercises: Seated   Sit to Sand  15 reps;without UE support   With red TB at knees      Vasopneumatic   Number Minutes Vasopneumatic   10 minutes    Vasopnuematic Location   Knee    Vasopneumatic Pressure  Medium    Vasopneumatic Temperature   Lowest             PT Education - 03/02/18 1158    Education Details  HEP updated; yellow looped TB issued to pt.     Person(s) Educated  Patient    Methods  Explanation;Demonstration;Verbal cues;Handout    Comprehension  Verbalized understanding;Returned demonstration;Verbal cues required;Need further instruction       PT Short Term Goals - 02/08/18 1455      PT SHORT TERM GOAL #1   Title  Pt will be independent with initial HEP     Status  Achieved      PT SHORT TERM GOAL #2   Title  Pt will ambulate with normal gait pattern with SPC     Status  Achieved        PT Long Term Goals - 02/23/18 1413      PT LONG TERM GOAL #1   Title  Pt will be independent with advanced HEP program     Status  Partially Met   met  for current      PT LONG TERM GOAL #2   Title  Pt will demonstrate R knee AROM 0-120 or greater    Status  Partially Met   0-106 dg      PT LONG TERM GOAL #3   Title  Pt will report pain no greater than 4/10 at worst    Status  On-going   Pt. noting R knee pain at worst of 6/10      PT LONG TERM GOAL #4   Title  Pt will ascend/descend 1 flight of stairs with normal gait pattern and LRAD to improve functional mobility     Status  Partially Met   Still with visible quad weakness on descending however now step-over-step with light rail use      PT LONG TERM GOAL #5   Title  Pt will report abilty to garden with no limitations or increase in pain    Status  On-going   Has not attempted due to hot weather      PT LONG TERM GOAL #6   Title  Pt will have 5/5 global R LE strength to improve ability to perform functional activities including climbing stairs    Status  Partially Met   met for R quad 5/5           Plan - 03/02/18 1105    Clinical Impression Statement  Pt. noting some increased lower back pain since last visit, which she notes, has improved somewhat today.  Tolerated all LE strengthening and ROM activities today well without complaint of LBP and HEP updated with additional quad stretching and hamstring strengthening activities.  Ended visit with ice/compression to R knee to decrease post-exercise swelling and pain.      PT Treatment/Interventions  ADLs/Self Care Home Management;Cryotherapy;Electrical Stimulation;Moist Heat;Ultrasound;DME Instruction;Gait training;Stair training;Functional mobility training;Therapeutic activities;Therapeutic exercise;Iontophoresis 11m/ml Dexamethasone;Balance training;Neuromuscular re-education;Patient/family education;Manual techniques;Scar mobilization;Passive range of motion;Dry needling;Taping;Vasopneumatic Device    Consulted  and Agree with Plan of Care  Patient       Patient will benefit from skilled therapeutic intervention in order  to improve the following deficits and impairments:  Abnormal gait, Hypomobility, Impaired sensation, Pain, Difficulty walking, Decreased mobility, Decreased activity tolerance, Decreased range of motion, Decreased skin integrity, Decreased strength, Increased edema, Increased muscle spasms, Impaired flexibility  Visit Diagnosis: Acute pain of right knee  Stiffness of right knee, not elsewhere classified  Other abnormalities of gait and mobility     Problem List Patient Active Problem List   Diagnosis Date Noted  . S/P total knee arthroplasty, right 01/08/2018  . Primary localized osteoarthritis of right knee 01/03/2018  . Spinal stenosis of lumbar region 05/30/2017  . Postoperative anemia due to acute blood loss 10/11/2015  . Arthritis 10/11/2015  . Constipation 10/11/2015  . Headache 10/11/2015  . Osteoarthritis of left knee 10/07/2015  . Obesity 10/07/2015  . S/P total knee replacement using cement 10/07/2015    Bess Harvest, PTA 03/02/18 12:10 PM   [facility] [address]  [place], [place], [address] Phone: [phone]   Fax:  [phone]  Name: Lauren Frye MRN: [id number] Date of Birth: 1950/07/19

## 2018-03-07 ENCOUNTER — Encounter: Payer: Self-pay | Admitting: Physical Therapy

## 2018-03-07 ENCOUNTER — Ambulatory Visit: Payer: BLUE CROSS/BLUE SHIELD | Attending: Orthopaedic Surgery | Admitting: Physical Therapy

## 2018-03-07 DIAGNOSIS — M25661 Stiffness of right knee, not elsewhere classified: Secondary | ICD-10-CM | POA: Diagnosis present

## 2018-03-07 DIAGNOSIS — R2689 Other abnormalities of gait and mobility: Secondary | ICD-10-CM | POA: Diagnosis present

## 2018-03-07 DIAGNOSIS — M25561 Pain in right knee: Secondary | ICD-10-CM | POA: Diagnosis present

## 2018-03-07 NOTE — Therapy (Signed)
[facility] [address]  [place], [place], [address] Phone: [phone]   Fax:  [phone]  Physical Therapy Treatment  Patient Details  Name: Lauren Frye MRN: [id number] Date of Birth: 08/31/1950 Referring Provider: Joni Fears, MD   Encounter Date: 03/07/2018  PT End of Session - 03/07/18 1625    Visit Number  14    Number of Visits  18    Date for PT Re-Evaluation  03/22/18    Authorization Type  BCBS    PT Start Time  1625   Pt arrived late   PT Stop Time  1711    PT Time Calculation (min)  46 min    Activity Tolerance  Patient tolerated treatment well    Behavior During Therapy  [facility] for tasks assessed/performed       Past Medical History:  Diagnosis Date  . Arthritis   . Depression    denies  . Headache    migraines    Past Surgical History:  Procedure Laterality Date  . ABDOMINAL HYSTERECTOMY    . ANKLE FUSION    . BOWEL RESECTION    . KNEE ARTHROSCOPY    . KNEE SURGERY Bilateral    LEFT ARTHROCOPY  X3    ,  RT X1  . TONSILLECTOMY    . TOTAL KNEE ARTHROPLASTY Left 10/07/2015  . TOTAL KNEE ARTHROPLASTY Left 10/07/2015   Procedure: TOTAL KNEE ARTHROPLASTY;  Surgeon: Garald Balding, MD;  Location: [place];  Service: Orthopedics;  Laterality: Left;  . TOTAL KNEE ARTHROPLASTY Right 01/03/2018  . TOTAL KNEE ARTHROPLASTY Right 01/03/2018   Procedure: RIGHT TOTAL KNEE ARTHROPLASTY;  Surgeon: Garald Balding, MD;  Location: [place];  Service: Orthopedics;  Laterality: Right;    There were no vitals filed for this visit.  Subjective Assessment - 03/07/18 1627    Subjective  Pt reporting her knee has been "extremely tight" today but denies pain.    Pertinent History  L TKA (10/2015)    Patient Stated Goals  To keep the knee moving    Currently in Pain?  No/denies    Pain Score  0-No pain         [facility] PT Assessment - 03/07/18 1625      AROM   Right Knee Extension  0    Right Knee  Flexion  109                   [facility] Adult PT Treatment/Exercise - 03/07/18 1625      Exercises   Exercises  Knee/Hip      Knee/Hip Exercises: Aerobic   Recumbent Bike  L3 x 5 min      Knee/Hip Exercises: Machines for Strengthening   Cybex Knee Extension  B LE 15# x10; B con/R ecc 10# x10    Cybex Knee Flexion  B con/R ecc 20# x15      Knee/Hip Exercises: Standing   Forward Lunges  Right;Left;10 reps;3 seconds   each   Forward Lunges Limitations  alternating with TRX fro support    Side Lunges  Right;Left;10 reps;3 seconds    Side Lunges Limitations  TRX      Knee/Hip Exercises: Seated   Other Seated Knee/Hip Exercises  R Fitter leg press (2 black) x 15 reps       Modalities   Modalities  Vasopneumatic      Vasopneumatic   Number Minutes Vasopneumatic   10 minutes  sitting in chair as pt w/ limited tolerance for supine   Vasopnuematic Location   Knee    Vasopneumatic Pressure  High    Vasopneumatic Temperature   Lowest      Manual Therapy   Manual Therapy  Soft tissue mobilization;Joint mobilization    Joint Mobilization  R patellar mobs all directions; R knee A/P mobs for increased flexion    Soft tissue mobilization  R knee scar tissue mobs               PT Short Term Goals - 02/08/18 1455      PT SHORT TERM GOAL #1   Title  Pt will be independent with initial HEP     Status  Achieved      PT SHORT TERM GOAL #2   Title  Pt will ambulate with normal gait pattern with SPC     Status  Achieved        PT Long Term Goals - 03/07/18 1908      PT LONG TERM GOAL #1   Title  Pt will be independent with advanced HEP program     Status  Partially Met      PT LONG TERM GOAL #2   Title  Pt will demonstrate R knee AROM 0-120 or greater    Status  Partially Met      PT LONG TERM GOAL #3   Title  Pt will report pain no greater than 4/10 at worst    Status  On-going      PT LONG TERM GOAL #4   Title  Pt will ascend/descend 1 flight of stairs  with normal gait pattern and LRAD to improve functional mobility     Status  Partially Met      PT LONG TERM GOAL #5   Title  Pt will report abilty to garden with no limitations or increase in pain    Status  On-going      PT LONG TERM GOAL #6   Title  Pt will have 5/5 global R LE strength to improve ability to perform functional activities including climbing stairs    Status  Partially Met            Plan - 03/07/18 1700    Clinical Impression Statement  R knee more stiff and swollen today with pt unable to identify triggering event/activity. Despite this, R knee AROM continues to gradually improve, currently 0-109. Increased unilateral open closed chain strengthening focus today to reduce reliance on L LE for support/stability with increased fatigue noted. Treatment concluded with vasopneumatic ice/compression to address increased swelling and decrease pain.    PT Treatment/Interventions  ADLs/Self Care Home Management;Cryotherapy;Electrical Stimulation;Moist Heat;Ultrasound;DME Instruction;Gait training;Stair training;Functional mobility training;Therapeutic activities;Therapeutic exercise;Iontophoresis 69m/ml Dexamethasone;Balance training;Neuromuscular re-education;Patient/family education;Manual techniques;Scar mobilization;Passive range of motion;Dry needling;Taping;Vasopneumatic Device    Consulted and Agree with Plan of Care  Patient       Patient will benefit from skilled therapeutic intervention in order to improve the following deficits and impairments:  Abnormal gait, Hypomobility, Impaired sensation, Pain, Difficulty walking, Decreased mobility, Decreased activity tolerance, Decreased range of motion, Decreased skin integrity, Decreased strength, Increased edema, Increased muscle spasms, Impaired flexibility  Visit Diagnosis: Acute pain of right knee  Stiffness of right knee, not elsewhere classified  Other abnormalities of gait and mobility     Problem  List Patient Active Problem List   Diagnosis Date Noted  . S/P total knee arthroplasty, right 01/08/2018  . Primary localized osteoarthritis of  right knee 01/03/2018  . Spinal stenosis of lumbar region 05/30/2017  . Postoperative anemia due to acute blood loss 10/11/2015  . Arthritis 10/11/2015  . Constipation 10/11/2015  . Headache 10/11/2015  . Osteoarthritis of left knee 10/07/2015  . Obesity 10/07/2015  . S/P total knee replacement using cement 10/07/2015    Percival Spanish, PT, MPT 03/07/2018, 7:09 PM  [facility] [address]  [place], [place], [address] Phone: [phone]   Fax:  [phone]  Name: Lauren Frye MRN: [id number] Date of Birth: [birth year]

## 2018-03-09 ENCOUNTER — Ambulatory Visit: Payer: BLUE CROSS/BLUE SHIELD

## 2018-03-09 DIAGNOSIS — M25561 Pain in right knee: Secondary | ICD-10-CM | POA: Diagnosis not present

## 2018-03-09 DIAGNOSIS — R2689 Other abnormalities of gait and mobility: Secondary | ICD-10-CM

## 2018-03-09 DIAGNOSIS — M25661 Stiffness of right knee, not elsewhere classified: Secondary | ICD-10-CM

## 2018-03-09 NOTE — Therapy (Signed)
[facility] [address]  [place], [place], [address] Phone: [phone]   Fax:  [phone]  Physical Therapy Treatment  Patient Details  Name: Lauren Frye MRN: [id number] Date of Birth: Oct 14, 1950 Referring Provider: Joni Fears, MD   Encounter Date: 03/09/2018  PT End of Session - 03/09/18 1104    Visit Number  15    Number of Visits  18    Date for PT Re-Evaluation  03/22/18    Authorization Type  BCBS    PT Start Time  1100    PT Stop Time  1145    PT Time Calculation (min)  45 min    Activity Tolerance  Patient tolerated treatment well    Behavior During Therapy  [facility] for tasks assessed/performed       Past Medical History:  Diagnosis Date  . Arthritis   . Depression    denies  . Headache    migraines    Past Surgical History:  Procedure Laterality Date  . ABDOMINAL HYSTERECTOMY    . ANKLE FUSION    . BOWEL RESECTION    . KNEE ARTHROSCOPY    . KNEE SURGERY Bilateral    LEFT ARTHROCOPY  X3    ,  RT X1  . TONSILLECTOMY    . TOTAL KNEE ARTHROPLASTY Left 10/07/2015  . TOTAL KNEE ARTHROPLASTY Left 10/07/2015   Procedure: TOTAL KNEE ARTHROPLASTY;  Surgeon: Garald Balding, MD;  Location: [place];  Service: Orthopedics;  Laterality: Left;  . TOTAL KNEE ARTHROPLASTY Right 01/03/2018  . TOTAL KNEE ARTHROPLASTY Right 01/03/2018   Procedure: RIGHT TOTAL KNEE ARTHROPLASTY;  Surgeon: Garald Balding, MD;  Location: [place];  Service: Orthopedics;  Laterality: Right;    There were no vitals filed for this visit.  Subjective Assessment - 03/09/18 1233    Subjective  Pt. reporting continued R knee tightness today.  no new complaints.      Pertinent History  L TKA (10/2015)    Patient Stated Goals  To keep the knee moving    Currently in Pain?  No/denies    Pain Score  0-No pain    Multiple Pain Sites  No                       [facility] Adult PT Treatment/Exercise - 03/09/18 1111       Knee/Hip Exercises: Aerobic   Recumbent Bike  L3 x 6 min      Knee/Hip Exercises: Machines for Strengthening   Cybex Knee Extension  B LE con/ecc: 20# x 15 reps     Cybex Knee Flexion  B con/ecc: 25# x 10 reps       Knee/Hip Exercises: Standing   Heel Raises  Both;Right;15 reps;3 seconds    Heel Raises Limitations  B con/ R ecc     Knee Flexion  Right;15 reps;Strengthening    Knee Flexion Limitations  red looped TB     Forward Lunges  Right;Left;10 reps;3 seconds    Forward Lunges Limitations  alternating with TRX for support    SLS  R/L SLS on foam 2 x 20 sec    intermittent counter support     Knee/Hip Exercises: Seated   Sit to Sand  10 reps;with UE support   from ~ 12" box with airex and B UE support on TRX     Manual Therapy   Manual Therapy  Soft tissue mobilization;Joint mobilization  Joint Mobilization  R patellar mobs all directions - some discomfort with sup/infer mobs     Soft tissue mobilization  R knee scar tissue massage     Myofascial Release  TPR to R mid quad - palpable TP - limited tolerance due to pain     Passive ROM  R manual overpressure into mod thomas stretch position for R quad/hip flexor                PT Short Term Goals - 02/08/18 1455      PT SHORT TERM GOAL #1   Title  Pt will be independent with initial HEP     Status  Achieved      PT SHORT TERM GOAL #2   Title  Pt will ambulate with normal gait pattern with SPC     Status  Achieved        PT Long Term Goals - 03/07/18 1908      PT LONG TERM GOAL #1   Title  Pt will be independent with advanced HEP program     Status  Partially Met      PT LONG TERM GOAL #2   Title  Pt will demonstrate R knee AROM 0-120 or greater    Status  Partially Met      PT LONG TERM GOAL #3   Title  Pt will report pain no greater than 4/10 at worst    Status  On-going      PT LONG TERM GOAL #4   Title  Pt will ascend/descend 1 flight of stairs with normal gait pattern and LRAD to improve  functional mobility     Status  Partially Met      PT LONG TERM GOAL #5   Title  Pt will report abilty to garden with no limitations or increase in pain    Status  On-going      PT LONG TERM GOAL #6   Title  Pt will have 5/5 global R LE strength to improve ability to perform functional activities including climbing stairs    Status  Partially Met            Plan - 03/09/18 1115    Clinical Impression Statement  Lauren Frye reporting continued R knee "tightness" which improved with therex today.  Manual TPR/STM addressing TP in R quad with noted reduction in TP however, pt. with limited tolerance for TPR due to discomfort thus discontinued.  Lauren Frye tolerated progression of SLS activities well today with addition of compliant surface.  Notes her greatest difficulty remains descending stairs and "bending the knee back all the way".  Deferred ice to end session as pt. with plans to ice at home.      PT Treatment/Interventions  ADLs/Self Care Home Management;Cryotherapy;Electrical Stimulation;Moist Heat;Ultrasound;DME Instruction;Gait training;Stair training;Functional mobility training;Therapeutic activities;Therapeutic exercise;Iontophoresis 70m/ml Dexamethasone;Balance training;Neuromuscular re-education;Patient/family education;Manual techniques;Scar mobilization;Passive range of motion;Dry needling;Taping;Vasopneumatic Device    Consulted and Agree with Plan of Care  Patient       Patient will benefit from skilled therapeutic intervention in order to improve the following deficits and impairments:  Abnormal gait, Hypomobility, Impaired sensation, Pain, Difficulty walking, Decreased mobility, Decreased activity tolerance, Decreased range of motion, Decreased skin integrity, Decreased strength, Increased edema, Increased muscle spasms, Impaired flexibility  Visit Diagnosis: Acute pain of right knee  Stiffness of right knee, not elsewhere classified  Other abnormalities of gait and  mobility     Problem List Patient Active Problem List   Diagnosis Date  Noted  . S/P total knee arthroplasty, right 01/08/2018  . Primary localized osteoarthritis of right knee 01/03/2018  . Spinal stenosis of lumbar region 05/30/2017  . Postoperative anemia due to acute blood loss 10/11/2015  . Arthritis 10/11/2015  . Constipation 10/11/2015  . Headache 10/11/2015  . Osteoarthritis of left knee 10/07/2015  . Obesity 10/07/2015  . S/P total knee replacement using cement 10/07/2015    Bess Harvest, PTA 03/09/18 12:39 PM   [facility] [address]  [place], [place], [address] Phone: [phone]   Fax:  [phone]  Name: Lauren Frye MRN: [id number] Date of Birth: 1950/08/24

## 2018-03-14 ENCOUNTER — Ambulatory Visit: Payer: BLUE CROSS/BLUE SHIELD

## 2018-03-21 ENCOUNTER — Encounter: Payer: Self-pay | Admitting: Physical Therapy

## 2018-03-21 ENCOUNTER — Ambulatory Visit: Payer: BLUE CROSS/BLUE SHIELD | Admitting: Physical Therapy

## 2018-03-21 DIAGNOSIS — M25561 Pain in right knee: Secondary | ICD-10-CM

## 2018-03-21 DIAGNOSIS — R2689 Other abnormalities of gait and mobility: Secondary | ICD-10-CM

## 2018-03-21 DIAGNOSIS — M25661 Stiffness of right knee, not elsewhere classified: Secondary | ICD-10-CM

## 2018-03-21 NOTE — Therapy (Addendum)
[facility] [address]  [place], [place], [address] Phone: [phone]   Fax:  [phone]  Physical Therapy Treatment  Patient Details  Name: Lauren Frye MRN: [id number] Date of Birth: 20-Feb-1951 Referring Provider: Joni Fears, MD   Encounter Date: 03/21/2018  PT End of Session - 03/21/18 1610    Visit Number  16    Number of Visits  18    Date for PT Re-Evaluation  03/22/18    Authorization Type  BCBS    PT Start Time  1610    PT Stop Time  1651    PT Time Calculation (min)  41 min    Activity Tolerance  Patient tolerated treatment well    Behavior During Therapy  [facility] for tasks assessed/performed       Past Medical History:  Diagnosis Date  . Arthritis   . Depression    denies  . Headache    migraines    Past Surgical History:  Procedure Laterality Date  . ABDOMINAL HYSTERECTOMY    . ANKLE FUSION    . BOWEL RESECTION    . KNEE ARTHROSCOPY    . KNEE SURGERY Bilateral    LEFT ARTHROCOPY  X3    ,  RT X1  . TONSILLECTOMY    . TOTAL KNEE ARTHROPLASTY Left 10/07/2015  . TOTAL KNEE ARTHROPLASTY Left 10/07/2015   Procedure: TOTAL KNEE ARTHROPLASTY;  Surgeon: Garald Balding, MD;  Location: [facility];  Service: Orthopedics;  Laterality: Left;  . TOTAL KNEE ARTHROPLASTY Right 01/03/2018  . TOTAL KNEE ARTHROPLASTY Right 01/03/2018   Procedure: RIGHT TOTAL KNEE ARTHROPLASTY;  Surgeon: Garald Balding, MD;  Location: [facility];  Service: Orthopedics;  Laterality: Right;    There were no vitals filed for this visit.  Subjective Assessment - 03/21/18 1612    Subjective  Pt reports she went back to work last week and states "it has been a rough couple of days" with the transition. Pt denies pain, but states her "knee is really stiff".    Pertinent History  L TKA (10/2015)    Patient Stated Goals  To keep the knee moving    Currently in Pain?  No/denies         [facility] PT Assessment - 03/21/18 1610       Assessment   Medical Diagnosis  Chronic Pain of R knee s/p R TKR    Referring Provider  Joni Fears, MD    Onset Date/Surgical Date  01/03/18    Next MD Visit  03/27/18      Observation/Other Assessments   Focus on Therapeutic Outcomes (FOTO)   Knee - 69% (31% limitation)      AROM   Right Knee Extension  0    Right Knee Flexion  108      Strength   Right Hip Flexion  5/5    Right Hip Extension  4+/5    Right Hip External Rotation   5/5    Right Hip Internal Rotation  5/5    Right Hip ABduction  5/5    Right Hip ADduction  5/5    Left Hip Flexion  5/5    Left Hip Extension  4+/5    Left Hip External Rotation  5/5    Left Hip Internal Rotation  5/5    Left Hip ABduction  5/5    Left Hip ADduction  5/5    Right Knee Flexion  5/5  Right Knee Extension  5/5    Left Knee Flexion  5/5    Left Knee Extension  5/5      Ambulation/Gait   Ambulation/Gait Assistance  7: Independent    Assistive device  None    Gait Pattern  Within Functional Limits    Ambulation Surface  Level;Indoor    Stair Management Technique  One rail Right;Alternating pattern    Number of Stairs  14    Height of Stairs  7    Gait Comments  Symmertrical reciprocal stair negotiation on both ascent and descent with good quad control - pt noting occasional catching senstation in back of knee on descent but no instablity noted.                   [facility] Adult PT Treatment/Exercise - 03/21/18 1610      Exercises   Exercises  Knee/Hip      Knee/Hip Exercises: Aerobic   Recumbent Bike  L3 x 6 min      Knee/Hip Exercises: Standing   Hip Extension  Right;10 reps;Knee straight;Stengthening    Extension Limitations  looped red TB      Knee/Hip Exercises: Supine   Patellar Mobs  WNL               PT Short Term Goals - 02/08/18 1455      PT SHORT TERM GOAL #1   Title  Pt will be independent with initial HEP     Status  Achieved      PT SHORT TERM GOAL #2   Title  Pt will  ambulate with normal gait pattern with SPC     Status  Achieved        PT Long Term Goals - 03/21/18 1614      PT LONG TERM GOAL #1   Title  Pt will be independent with advanced HEP program     Status  Achieved      PT LONG TERM GOAL #2   Title  Pt will demonstrate R knee AROM 0-120 or greater    Status  Partially Met      PT LONG TERM GOAL #3   Title  Pt will report pain no greater than 4/10 at worst    Status  Achieved      PT LONG TERM GOAL #4   Title  Pt will ascend/descend 1 flight of stairs with normal gait pattern and LRAD to improve functional mobility     Status  Achieved      PT LONG TERM GOAL #5   Title  Pt will report abilty to garden with no limitations or increase in pain    Status  Achieved      PT LONG TERM GOAL #6   Title  Pt will have 5/5 global R LE strength to improve ability to perform functional activities including climbing stairs    Status  Achieved            Plan - 03/21/18 1614    Clinical Impression Statement  Lauren Frye pleased with her progress s/p R TKR, noting only frustration with ongoing swelling and resultant tightness limiting flexion ROM - current R knee AROM 0-108 dg. Pain well controlled with typical daily activities inlcuding return to work last week. She is now ambulating with normal gait pattern w/o AD including symmetrical reciprocal stair negotiation with good control on both ascent and descent. She is independent with ongoing HEP and feels comfortable transitioning to HEP  at this time, but will place her on hold pending f/u appt with MD on 03/27/18 in the event that MD feels further therapy is indicated.    PT Treatment/Interventions  ADLs/Self Care Home Management;Cryotherapy;Electrical Stimulation;Moist Heat;Ultrasound;DME Instruction;Gait training;Stair training;Functional mobility training;Therapeutic activities;Therapeutic exercise;Iontophoresis 32m/ml Dexamethasone;Balance training;Neuromuscular re-education;Patient/family  education;Manual techniques;Scar mobilization;Passive range of motion;Dry needling;Taping;Vasopneumatic Device    PT Next Visit Plan  30 day hold    Consulted and Agree with Plan of Care  Patient       Patient will benefit from skilled therapeutic intervention in order to improve the following deficits and impairments:  Abnormal gait, Hypomobility, Impaired sensation, Pain, Difficulty walking, Decreased mobility, Decreased activity tolerance, Decreased range of motion, Decreased skin integrity, Decreased strength, Increased edema, Increased muscle spasms, Impaired flexibility  Visit Diagnosis: Acute pain of right knee  Stiffness of right knee, not elsewhere classified  Other abnormalities of gait and mobility     Problem List Patient Active Problem List   Diagnosis Date Noted  . S/P total knee arthroplasty, right 01/08/2018  . Primary localized osteoarthritis of right knee 01/03/2018  . Spinal stenosis of lumbar region 05/30/2017  . Postoperative anemia due to acute blood loss 10/11/2015  . Arthritis 10/11/2015  . Constipation 10/11/2015  . Headache 10/11/2015  . Osteoarthritis of left knee 10/07/2015  . Obesity 10/07/2015  . S/P total knee replacement using cement 10/07/2015    JPercival Spanish PT, MPT 03/21/2018, 6:16 PM  [facility] [place] [address]Phone: [phone]  Fax:  [phone] Name: Lauren SLEVINMRN: [id number] of Birth: 1September 26, 1952 PHYSICAL THERAPY DISCHARGE SUMMARY  Visits from Start of Care: 16  Current functional level related to goals / functional outcomes:   Refer to above clinical impression for status as of last visit on 03/21/18. Pt was placed on hold for 30 days and has not needed to return to PT, therefore will proceed with discharge from PT for this episode.   Remaining deficits:   As above.   Education / Equipment:   HEP  Plan: Patient  agrees to discharge.  Patient goals were mostly met. Patient is being discharged due to being pleased with the current functional level.  ?????     JPercival Spanish PT, MPT 05/09/18, 11:25 AM  [facility] [place] [address]Phone: [phone]  Fax:  [phone]

## 2018-03-27 ENCOUNTER — Ambulatory Visit (INDEPENDENT_AMBULATORY_CARE_PROVIDER_SITE_OTHER): Payer: BLUE CROSS/BLUE SHIELD | Admitting: Orthopaedic Surgery

## 2018-03-27 ENCOUNTER — Encounter (INDEPENDENT_AMBULATORY_CARE_PROVIDER_SITE_OTHER): Payer: Self-pay | Admitting: Orthopaedic Surgery

## 2018-03-27 VITALS — BP 113/91 | HR 91 | Ht 66.0 in | Wt 220.0 lb

## 2018-03-27 DIAGNOSIS — Z96651 Presence of right artificial knee joint: Secondary | ICD-10-CM

## 2018-03-27 NOTE — Progress Notes (Signed)
Office Visit Note   Patient: Lauren Frye           Date of Birth: May 11, 1951           MRN: [id number] Visit Date: 03/27/2018              Requested by: No referring provider defined for this encounter. PCP: Patient, No Pcp Per   Assessment & Plan: Visit Diagnoses:  1. History of total right knee replacement     Plan: Approaching 3 months status post primary right total knee replacement and doing well.  Has completed a course of physical therapy.  Still having some mild pain and some swelling.  Will urged continuation of exercises and follow-up in 6 months.  No significant complication  Follow-Up Instructions: Return in about 6 months (around 09/25/2018).   Orders:  No orders of the defined types were placed in this encounter.  No orders of the defined types were placed in this encounter.     Procedures: No procedures performed   Clinical Data: No additional findings.   Subjective: Chief Complaint  Patient presents with  . Follow-up    01/03/2018 R TKR STILL HAVING PAIN AND SWELLING  Has completed a course of physical therapy and happy with her progress.  Still having some swelling of her knee similar to what she had with her left total knee replacement.  Still has occasional pain but does not use any ambulatory aid.  Walks without a limp.  Does not have any compromise of activities.  No fever or chills  HPI  Review of Systems  Constitutional: Negative for fatigue and fever.  HENT: Negative for ear pain.   Eyes: Negative for pain.  Respiratory: Negative for cough and shortness of breath.   Cardiovascular: Positive for leg swelling.  Gastrointestinal: Negative for constipation and diarrhea.  Genitourinary: Negative for difficulty urinating.  Musculoskeletal: Positive for back pain. Negative for neck pain.  Skin: Negative for rash.  Allergic/Immunologic: Negative for food allergies.  Neurological: Positive for weakness and numbness.  Hematological:  Bruises/bleeds easily.  Psychiatric/Behavioral: Positive for sleep disturbance.     Objective: Vital Signs: BP (!) 113/91 (BP Location: Left Arm, Patient Position: Sitting, Cuff Size: Normal)   Pulse 91   Ht 5\' 6"  (1.676 m)   Wt 220 lb (99.8 kg)   BMI 35.51 kg/m   Physical Exam  Ortho Exam right knee incision healing without problem.  Full extension to 105 degrees of flexion.  No instability.  Negative anterior drawer sign.  Some swelling compared to her left knee.  Possibly small effusion.  Appears to have good strength.  Neurovascular exam intact  Specialty Comments:  No specialty comments available.  Imaging: No results found.   PMFS History: Patient Active Problem List   Diagnosis Date Noted  . S/P total knee arthroplasty, right 01/08/2018  . Primary localized osteoarthritis of right knee 01/03/2018  . Spinal stenosis of lumbar region 05/30/2017  . Postoperative anemia due to acute blood loss 10/11/2015  . Arthritis 10/11/2015  . Constipation 10/11/2015  . Headache 10/11/2015  . Osteoarthritis of left knee 10/07/2015  . Obesity 10/07/2015  . 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 Arthritis   . Depression    denies  . Headache    migraines    Family History  Problem Relation Age of Onset  . Parkinson's disease Mother   . COPD Mother   . Diabetes Sister  Past Surgical History:  Procedure Laterality Date  . ABDOMINAL HYSTERECTOMY    . ANKLE FUSION    . BOWEL RESECTION    . KNEE ARTHROSCOPY    . KNEE SURGERY Bilateral    LEFT ARTHROCOPY  X3    ,  RT X1  . TONSILLECTOMY    . TOTAL KNEE ARTHROPLASTY Left 10/07/2015  . TOTAL KNEE ARTHROPLASTY Left 10/07/2015   Procedure: TOTAL KNEE ARTHROPLASTY;  Surgeon: Valeria Batman, MD;  Location: [facility] OR;  Service: Orthopedics;  Laterality: Left;  . TOTAL KNEE ARTHROPLASTY Right 01/03/2018  . TOTAL KNEE ARTHROPLASTY Right 01/03/2018   Procedure: RIGHT TOTAL  KNEE ARTHROPLASTY;  Surgeon: Valeria Batman, MD;  Location: [facility] OR;  Service: Orthopedics;  Laterality: Right;   Social History   Occupational History  . Not on file  Tobacco Use  . Smoking status: Never Smoker  . Smokeless tobacco: Never Used  Substance and Sexual Activity  . Alcohol use: Yes    Alcohol/week: 1.0 standard drinks    Types: 1 Glasses of wine per week    Comment: ONCE A week  . Drug use: No  . Sexual activity: Not Currently    Birth control/protection: Abstinence

## 2018-04-18 ENCOUNTER — Other Ambulatory Visit (INDEPENDENT_AMBULATORY_CARE_PROVIDER_SITE_OTHER): Payer: Self-pay | Admitting: Orthopedic Surgery

## 2018-04-18 ENCOUNTER — Telehealth (INDEPENDENT_AMBULATORY_CARE_PROVIDER_SITE_OTHER): Payer: Self-pay | Admitting: Orthopaedic Surgery

## 2018-04-18 MED ORDER — METHOCARBAMOL 500 MG PO TABS: ORAL_TABLET | ORAL | 0 refills | Status: DC

## 2018-04-18 NOTE — Telephone Encounter (Signed)
Patient requests a refill on her muscle relaxer (Robaxin) sent to [facility] [place]. Patient did not know name of medication.

## 2018-04-18 NOTE — Telephone Encounter (Signed)
Please advise. Thank you

## 2018-04-18 NOTE — Telephone Encounter (Signed)
Escribed

## 2018-04-18 NOTE — Telephone Encounter (Signed)
I called patient 

## 2018-06-15 ENCOUNTER — Telehealth (INDEPENDENT_AMBULATORY_CARE_PROVIDER_SITE_OTHER): Payer: Self-pay | Admitting: Orthopaedic Surgery

## 2018-06-15 ENCOUNTER — Other Ambulatory Visit: Payer: Self-pay | Admitting: *Deleted

## 2018-06-15 MED ORDER — IBUPROFEN 800 MG PO TABS: 800.0000 mg | ORAL_TABLET | Freq: Three times a day (TID) | ORAL | 0 refills | Status: DC | PRN

## 2018-06-15 NOTE — Telephone Encounter (Signed)
Ok to refill 

## 2018-06-15 NOTE — Telephone Encounter (Signed)
Patient requests a refill on Ibuprofen 800mg  [facility] [address] [facility] [place]

## 2018-06-15 NOTE — Telephone Encounter (Signed)
I called patient, RX sent to pharm

## 2018-06-15 NOTE — Telephone Encounter (Signed)
ok 

## 2018-08-08 ENCOUNTER — Encounter (INDEPENDENT_AMBULATORY_CARE_PROVIDER_SITE_OTHER): Payer: Self-pay | Admitting: Orthopaedic Surgery

## 2018-08-08 ENCOUNTER — Ambulatory Visit (INDEPENDENT_AMBULATORY_CARE_PROVIDER_SITE_OTHER): Payer: BLUE CROSS/BLUE SHIELD | Admitting: Orthopaedic Surgery

## 2018-08-08 VITALS — BP 122/89 | HR 87 | Ht 66.0 in | Wt 222.0 lb

## 2018-08-08 DIAGNOSIS — M545 Low back pain, unspecified: Secondary | ICD-10-CM

## 2018-08-08 DIAGNOSIS — G8929 Other chronic pain: Secondary | ICD-10-CM | POA: Diagnosis not present

## 2018-08-08 MED ORDER — BUPIVACAINE HCL 0.5 % IJ SOLN
2.0000 mL | INTRAMUSCULAR | Status: AC | PRN
  Administered 2018-08-08: 2 mL via INTRA_ARTICULAR

## 2018-08-08 MED ORDER — LIDOCAINE HCL 1 % IJ SOLN
2.0000 mL | INTRAMUSCULAR | Status: AC | PRN
  Administered 2018-08-08: 2 mL

## 2018-08-08 MED ORDER — METHYLPREDNISOLONE ACETATE 40 MG/ML IJ SUSP
80.0000 mg | INTRAMUSCULAR | Status: AC | PRN
  Administered 2018-08-08: 80 mg

## 2018-08-08 NOTE — Progress Notes (Signed)
Office Visit Note   Patient: Lauren Frye           Date of Birth: 01-12-1951           MRN: [id number] Visit Date: 08/08/2018              Requested by: No referring provider defined for this encounter. PCP: Patient, No Pcp Per   Assessment & Plan: Visit Diagnoses:  1. Chronic bilateral low back pain without sciatica     Plan: Right total knee replacement in July 2019 and doing quite well..  Working full-time and happy with the results.  Had a recent exacerbation of left hip pain.  Has had prior issues with good result with local cortisone injection over the greater trochanter.  Will urged her to continue with exercises for both of her lower extremities and inject the greater trochanter left with cortisone.  We will set up physical therapy for back exercises and plan to see her back as needed  Follow-Up Instructions: Return if symptoms worsen or fail to improve.   Orders:  Orders Placed This Encounter  Procedures  . Ambulatory referral to Physical Therapy   No orders of the defined types were placed in this encounter.     Procedures: Large Joint Inj: L greater trochanter on 08/08/2018 5:02 PM Indications: pain and diagnostic evaluation Details: 25 G 1.5 in needle, lateral approach  Arthrogram: No  Medications: 2 mL lidocaine 1 %; 2 mL bupivacaine 0.5 %; 80 mg methylPREDNISolone acetate 40 MG/ML Procedure, treatment alternatives, risks and benefits explained, specific risks discussed. Consent was given by the patient. Immediately prior to procedure a time out was called to verify the correct patient, procedure, equipment, support staff and site/side marked as required. Patient was prepped and draped in the usual sterile fashion.       Clinical Data: No additional findings.   Subjective: Chief Complaint  Patient presents with  . Right Knee - Follow-up  . Lower Back - Pain  . Post-op Follow-up    Right Total Knee 01/03/18 overall doing well  . Back Pain    lower  back pain constant, difficulty standing due to pain with radiating lateral left hip pain x 2 weeks, no groin pain. previous left hip injection approximatley 1 year ago with relief until now. Patient has stopped going to gym due to pain in back. Aleve, 800mg  ibuprofen with no relief. no new injury. previous back injection with Dr. Alvester Morin no relief.  Had recent exacerbation of lateral left hip pain consistent with her prior history of greater trochanteric bursitis.  Doing well in terms of her right knee and very happy now status post knee replacement in July.  No fever chills shortness of breath or chest pain.  Working full-time in a sitting position  HPI  Review of Systems   Objective: Vital Signs: BP 122/89 (BP Location: Left Arm, Patient Position: Sitting, Cuff Size: Normal)   Pulse 87   Ht 5\' 6"  (1.676 m)   Wt 222 lb (100.7 kg)   BMI 35.83 kg/m   Physical Exam Constitutional:      Appearance: She is well-developed.  Eyes:     Pupils: Pupils are equal, round, and reactive to light.  Pulmonary:     Effort: Pulmonary effort is normal.  Skin:    General: Skin is warm and dry.  Neurological:     Mental Status: She is alert and oriented to person, place, and time.  Psychiatric:  Behavior: Behavior normal.     Ortho Exam pain directly over the greater trochanter left hip.  No pain range of motion of either hip.  Straight leg raise negative.  Doing quite well in terms of her right knee without instability.  Incision healing nicely.  Good strength.  With full extension and flexion but 100 degrees  Specialty Comments:  No specialty comments available.  Imaging: No results found.   PMFS History: Patient Active Problem List   Diagnosis Date Noted  . S/P total knee arthroplasty, right 01/08/2018  . Primary localized osteoarthritis of right knee 01/03/2018  . Spinal stenosis of lumbar region 05/30/2017  . Postoperative anemia due to acute blood loss 10/11/2015  . Arthritis  10/11/2015  . Constipation 10/11/2015  . Headache 10/11/2015  . Osteoarthritis of left knee 10/07/2015  . Obesity 10/07/2015  . 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 Arthritis   . Depression    denies  . Headache    migraines    Family History  Problem Relation Age of Onset  . Parkinson's disease Mother   . COPD Mother   . Diabetes Sister     Past Surgical History:  Procedure Laterality Date  . ABDOMINAL HYSTERECTOMY    . ANKLE FUSION    . BOWEL RESECTION    . KNEE ARTHROSCOPY    . KNEE SURGERY Bilateral    LEFT ARTHROCOPY  X3    ,  RT X1  . TONSILLECTOMY    . TOTAL KNEE ARTHROPLASTY Left 10/07/2015  . TOTAL KNEE ARTHROPLASTY Left 10/07/2015   Procedure: TOTAL KNEE ARTHROPLASTY;  Surgeon: Valeria Batman, MD;  Location: [facility] OR;  Service: Orthopedics;  Laterality: Left;  . TOTAL KNEE ARTHROPLASTY Right 01/03/2018  . TOTAL KNEE ARTHROPLASTY Right 01/03/2018   Procedure: RIGHT TOTAL KNEE ARTHROPLASTY;  Surgeon: Valeria Batman, MD;  Location: [facility] OR;  Service: Orthopedics;  Laterality: Right;   Social History   Occupational History  . Not on file  Tobacco Use  . Smoking status: Never Smoker  . Smokeless tobacco: Never Used  Substance and Sexual Activity  . Alcohol use: Yes    Alcohol/week: 1.0 standard drinks    Types: 1 Glasses of wine per week    Comment: ONCE A week  . Drug use: No  . Sexual activity: Not Currently    Birth control/protection: Abstinence     Valeria Batman, MD   Note - This record has been created using [employer].  Chart creation errors have been sought, but may not always  have been located. Such creation errors do not reflect on  the standard of medical care.

## 2018-08-29 ENCOUNTER — Ambulatory Visit: Payer: BLUE CROSS/BLUE SHIELD | Attending: Orthopaedic Surgery | Admitting: Physical Therapy

## 2018-08-29 ENCOUNTER — Encounter: Payer: Self-pay | Admitting: Physical Therapy

## 2018-08-29 ENCOUNTER — Other Ambulatory Visit: Payer: Self-pay

## 2018-08-29 DIAGNOSIS — G8929 Other chronic pain: Secondary | ICD-10-CM | POA: Insufficient documentation

## 2018-08-29 DIAGNOSIS — M545 Low back pain: Secondary | ICD-10-CM | POA: Diagnosis not present

## 2018-08-29 DIAGNOSIS — M6281 Muscle weakness (generalized): Secondary | ICD-10-CM | POA: Diagnosis present

## 2018-08-29 DIAGNOSIS — M25552 Pain in left hip: Secondary | ICD-10-CM | POA: Diagnosis present

## 2018-08-29 DIAGNOSIS — R29898 Other symptoms and signs involving the musculoskeletal system: Secondary | ICD-10-CM

## 2018-08-29 NOTE — Therapy (Signed)
[facility] [address]  [address] [place], [place], [address] Phone: [phone]   Fax:  [phone]  Physical Therapy Evaluation  Patient Details  Name: Lauren Frye MRN: [id number] Date of Birth: 03/02/1951 Referring Provider (PT): [name], MD   Encounter Date: 08/29/2018  PT End of Session - 08/29/18 1820    Visit Number  1    Number of Visits  5    Date for PT Re-Evaluation  09/26/18    Authorization Type  [employer]    PT Start Time  1616    PT Stop Time  1654    PT Time Calculation (min)  38 min    Activity Tolerance  Patient tolerated treatment well;Patient limited by pain    Behavior During Therapy  [facility] for tasks assessed/performed       Past Medical History:  Diagnosis Date  . Arthritis   . Depression    denies  . Headache    migraines    Past Surgical History:  Procedure Laterality Date  . ABDOMINAL HYSTERECTOMY    . ANKLE FUSION    . BOWEL RESECTION    . KNEE ARTHROSCOPY    . KNEE SURGERY Bilateral    LEFT ARTHROCOPY  X3    ,  RT X1  . TONSILLECTOMY    . TOTAL KNEE ARTHROPLASTY Left 10/07/2015  . TOTAL KNEE ARTHROPLASTY Left 10/07/2015   Procedure: TOTAL KNEE ARTHROPLASTY;  Surgeon: Valeria Batman, MD;  Location: [facility] OR;  Service: Orthopedics;  Laterality: Left;  . TOTAL KNEE ARTHROPLASTY Right 01/03/2018  . TOTAL KNEE ARTHROPLASTY Right 01/03/2018   Procedure: RIGHT TOTAL KNEE ARTHROPLASTY;  Surgeon: Valeria Batman, MD;  Location: [facility] OR;  Service: Orthopedics;  Laterality: Right;    There were no vitals filed for this visit.   Subjective Assessment - 08/29/18 1617    Subjective  Patient reports B LBP for 2 years duration without causative event. Denies N/T or radiation. LBP worse with getting in and out of the car, stairs, STS, sitting upright, rolling in bed, getting out of bed, lifting heavy items. Better with sitting in recliner, heat. Patient would like to return to gym activities  but has avoided going d/t fear of hurting her back as well as pain. Has also received steroid injections to B hips with some improvement in R hip, L hip pain still remains. Patient reports that there are no clear easing factors and that when pain begins, it is constant and radiates down her lateral LE.     Pertinent History  HA, L TKA 2017, R TKA 2019, ankle fusion    Limitations  Sitting;Lifting;Standing;Walking;House hold activities    How long can you sit comfortably?  1 hour    How long can you stand comfortably?  1-1.5 hours    How long can you walk comfortably?  15 min    Diagnostic tests  none recent    Patient Stated Goals  learn exercises to do and to return back to the gym    Currently in Pain?  Yes    Pain Score  7     Pain Location  Back    Pain Orientation  Right;Left;Lower    Pain Descriptors / Indicators  Aching    Pain Type  Chronic pain         [facility] PT Assessment - 08/29/18 1626      Assessment   Medical Diagnosis  Chronic B LBP without sciatica  Referring Provider (PT)  [name], MD    Onset Date/Surgical Date  08/29/16    Next MD Visit  Not scheduled    Prior Therapy  Yes- for TKA      Precautions   Precautions  None      Balance Screen   Has the patient fallen in the past 6 months  No    Has the patient had a decrease in activity level because of a fear of falling?   No    Is the patient reluctant to leave their home because of a fear of falling?   No      Home Environment   Living Environment  Private residence    Living Arrangements  Alone    Home Access  Stairs to enter    Entrance Stairs-Number of Steps  5 to enter front door, 2 in the garage    Entrance Stairs-Rails  Right;Left    Home Layout  One level    Home Equipment  [place] - single point;Emergency planning/management officer - 2 wheels      Prior Function   Level of Independence  Independent    Vocation  Full time employment    Chief Operating Officer- sitting at computer    Leisure  going to  gym; sitting for stain glass work       Cognition   Overall Cognitive Status  Within Functional Limits for tasks assessed      Observation/Other Assessments   Focus on Therapeutic Outcomes (FOTO)   Lumbar: 49 (51% limited, 40% predicted)      Sensation   Light Touch  Appears Intact      Coordination   Gross Motor Movements are Fluid and Coordinated  Yes      Posture/Postural Control   Posture/Postural Control  Postural limitations    Postural Limitations  Rounded Shoulders;Forward head;Posterior pelvic tilt      ROM / Strength   AROM / PROM / Strength  AROM;Strength      AROM   AROM Assessment Site  Lumbar    Lumbar Flexion  distal shin    Lumbar Extension  moderately limited   moderate pain in L lateral hip   Lumbar - Right Side Bend  distal thihg    Lumbar - Left Side Bend  distal thigh    Lumbar - Right Rotation  WFL    Lumbar - Left Rotation  WFL   L LB "pulling" moderate pain     Strength   Strength Assessment Site  Hip;Knee;Ankle    Right/Left Hip  Right;Left    Right Hip Flexion  4-/5   R LB & hip pain   Right Hip ABduction  4/5    Right Hip ADduction  4+/5    Left Hip Flexion  4/5    Left Hip ABduction  4/5    Left Hip ADduction  4+/5    Right/Left Knee  Right;Left    Right Knee Flexion  4+/5    Right Knee Extension  4+/5    Left Knee Flexion  4+/5    Left Knee Extension  4+/5    Right/Left Ankle  Right;Left    Right Ankle Dorsiflexion  4+/5    Left Ankle Dorsiflexion  4+/5      Flexibility   Soft Tissue Assessment /Muscle Length  yes    ITB  B mildly tight    Piriformis  B atleast mildly tight; stretch on R limited by knee flexion  Palpation   Palpation comment  very TTP along B lumbar paraspinals, superior glute, lateral glute, along B TFL and ITB      Bed Mobility   Bed Mobility  Rolling Right;Rolling Left    Rolling Right  --   difficulty and increased time required   Rolling Left  --   difficulty and increased time required      Transfers   Transfers  Supine to Sit    Supine to Sit  4: Min guard   d/t pain     Ambulation/Gait   Gait Pattern  Step-through pattern;Decreased weight shift to right;Decreased stance time - right;Decreased hip/knee flexion - right;Decreased step length - right    Ambulation Surface  Level;Indoor    Gait velocity  WFL                Objective measurements completed on examination: See above findings.              PT Education - 08/29/18 1820    Education Details  prognosis, POC, HEP    Person(s) Educated  Patient    Methods  Explanation;Demonstration;Tactile cues;Verbal cues;Handout    Comprehension  Verbalized understanding;Returned demonstration       PT Short Term Goals - 08/29/18 1822      PT SHORT TERM GOAL #1   Title  Pt will be independent with initial HEP     Time  2    Period  Weeks    Status  New    Target Date  09/12/18        PT Long Term Goals - 08/29/18 1822      PT LONG TERM GOAL #1   Title  Pt will be independent with advanced HEP program     Time  4    Period  Weeks    Status  New    Target Date  09/26/18      PT LONG TERM GOAL #2   Title  Patient to demonstrate lumbar AROM WFL and without pain limiting.     Time  4    Period  Weeks    Status  New    Target Date  09/26/18      PT LONG TERM GOAL #3   Title  Patient to demonstrate B hip strength >=4+/5.    Time  4    Period  Weeks    Status  New    Target Date  09/26/18      PT LONG TERM GOAL #4   Title  Patient to recall and demonstrate proper lifting mechanics to lift 10lb box from ground.     Time  4    Period  Weeks    Status  New    Target Date  09/26/18      PT LONG TERM GOAL #5   Title  Patient to return to light gym activities without pain limiting.     Time  4    Period  Weeks    Status  New    Target Date  09/26/18             Plan - 08/29/18 1820    Clinical Impression Statement  Patient is a 67y/o F presenting to OPPT with c/o chronic B LBP  and L hip pain without causative event. No c/o N/T or radiation from LB. Aggravating factors include with getting in and out of the car, stairs, STS, sitting upright, rolling in bed, getting out of bed,  lifting heavy items. L hip pain is mildly better from recent steroid injection, and does not have clear aggravating/easing factors. Patient notes intermittent radiation of pain down lateral LE. Patient today with painful lumbar AROM with extension and L rotation, decreased B proximal hip strength, tightness in TFL and piriformis, extremely TTP along B lumbar paraspinals, superior glute, lateral glute, along B TFL and ITB, and considerable pain and difficulty with bed mobility and transfers. Patient educated on and received gentle stretching and strengthening HEP and reported understanding. Would benefit from skilled PT services 1x/week for 4 weeks to address aforementioned impairments.     Clinical Presentation  Stable    Clinical Decision Making  Low    Rehab Potential  Good    Clinical Impairments Affecting Rehab Potential  HA, L TKA 2017, R TKA 2019, ankle fusion    PT Frequency  1x / week    PT Duration  4 weeks    PT Treatment/Interventions  ADLs/Self Care Home Management;Cryotherapy;Electrical Stimulation;Iontophoresis /ml Dexamethasone;Functional mobility training;Stair training;Gait training;DME Instruction;Ultrasound;Traction;Moist Heat;Therapeutic activities;Therapeutic exercise;Balance training;Neuromuscular re-education;Patient/family education;Passive range of motion;Manual techniques;Dry needling;Energy conservation;Splinting;Taping;Vasopneumatic Device;Vestibular    PT Next Visit Plan  reassess HEP; address lifting and body mechanics, abdominal bracing    Consulted and Agree with Plan of Care  Patient       Patient will benefit from skilled therapeutic intervention in order to improve the following deficits and impairments:  Decreased activity tolerance, Decreased strength, Pain,  Increased muscle spasms, Decreased mobility, Difficulty walking, Improper body mechanics, Decreased range of motion, Impaired flexibility, Postural dysfunction  Visit Diagnosis: Chronic bilateral low back pain without sciatica  Pain in left hip  Muscle weakness (generalized)  Other symptoms and signs involving the musculoskeletal system     Problem List Patient Active Problem List   Diagnosis Date Noted  . S/P total knee arthroplasty, right 01/08/2018  . Primary localized osteoarthritis of right knee 01/03/2018  . Spinal stenosis of lumbar region 05/30/2017  . Postoperative anemia due to acute blood loss 10/11/2015  . Arthritis 10/11/2015  . Constipation 10/11/2015  . Headache 10/11/2015  . Osteoarthritis of left knee 10/07/2015  . Obesity 10/07/2015  . S/P total knee replacement using cement 10/07/2015     Anette Guarneri, PT, DPT 08/29/18 6:26 PM   [facility] [address]  [address] [place], [place], [address] Phone: [phone]   Fax:  [phone]  Name: LEVONIA WOLFLEY MRN: [id number] Date of Birth: 21-Apr-1951

## 2018-09-06 ENCOUNTER — Ambulatory Visit: Payer: BLUE CROSS/BLUE SHIELD

## 2018-09-13 ENCOUNTER — Encounter: Payer: Self-pay | Admitting: Physical Therapy

## 2018-09-13 ENCOUNTER — Other Ambulatory Visit: Payer: Self-pay

## 2018-09-13 ENCOUNTER — Ambulatory Visit: Payer: BLUE CROSS/BLUE SHIELD | Attending: Orthopaedic Surgery | Admitting: Physical Therapy

## 2018-09-13 DIAGNOSIS — G8929 Other chronic pain: Secondary | ICD-10-CM | POA: Diagnosis present

## 2018-09-13 DIAGNOSIS — M25552 Pain in left hip: Secondary | ICD-10-CM | POA: Insufficient documentation

## 2018-09-13 DIAGNOSIS — M545 Low back pain, unspecified: Secondary | ICD-10-CM

## 2018-09-13 DIAGNOSIS — M6281 Muscle weakness (generalized): Secondary | ICD-10-CM | POA: Diagnosis present

## 2018-09-13 DIAGNOSIS — R29898 Other symptoms and signs involving the musculoskeletal system: Secondary | ICD-10-CM | POA: Diagnosis present

## 2018-09-13 NOTE — Therapy (Addendum)
[facility] [address]  [place], [place], [address] Phone: [phone]   Fax:  [phone]  Physical Therapy Treatment  Patient Details  Name: Lauren Frye MRN: [id number] Date of Birth: [birth year] Referring Provider (PT): Joni Fears, MD   Encounter Date: 09/13/2018  PT End of Session - 09/13/18 1657    Visit Number  2    Number of Visits  5    Date for PT Re-Evaluation  09/26/18    Authorization Type  BC[employer]    PT Start Time  1615    PT Stop Time  1655    PT Time Calculation (min)  40 min    Activity Tolerance  Patient tolerated treatment well;Patient limited by pain    Behavior During Therapy  [facility] for tasks assessed/performed       Past Medical History:  Diagnosis Date  . Arthritis   . Depression    denies  . Headache    migraines    Past Surgical History:  Procedure Laterality Date  . ABDOMINAL HYSTERECTOMY    . ANKLE FUSION    . BOWEL RESECTION    . KNEE ARTHROSCOPY    . KNEE SURGERY Bilateral    LEFT ARTHROCOPY  X3    ,  RT X1  . TONSILLECTOMY    . TOTAL KNEE ARTHROPLASTY Left 10/07/2015  . TOTAL KNEE ARTHROPLASTY Left 10/07/2015   Procedure: TOTAL KNEE ARTHROPLASTY;  Surgeon: Garald Balding, MD;  Location: [facility];  Service: Orthopedics;  Laterality: Left;  . TOTAL KNEE ARTHROPLASTY Right 01/03/2018  . TOTAL KNEE ARTHROPLASTY Right 01/03/2018   Procedure: RIGHT TOTAL KNEE ARTHROPLASTY;  Surgeon: Garald Balding, MD;  Location: [facility];  Service: Orthopedics;  Laterality: Right;    There were no vitals filed for this visit.  Subjective Assessment - 09/13/18 1614    Subjective  Reports that she feels like the exercises are making her hurt worse. Does not know if a certain one exercise is bothering her most.     Pertinent History  HA, L TKA 2017, R TKA 2019, ankle fusion    Diagnostic tests  none recent    Patient Stated Goals  learn exercises to do and to return back to the gym     Currently in Pain?  Yes    Pain Score  2     Pain Location  Back    Pain Orientation  Right;Left;Lower    Pain Descriptors / Indicators  Sore    Pain Type  Chronic pain    Multiple Pain Sites  Yes    Pain Score  2    Pain Location  Hip    Pain Orientation  Left    Pain Descriptors / Indicators  Sore    Pain Type  Acute pain                       [facility] Adult PT Treatment/Exercise - 09/13/18 0001      Self-Care   Self-Care  Other Self-Care Comments    Other Self-Care Comments   edu on self- STM with ball on wall massage to L glute      Exercises   Exercises  Lumbar;Knee/Hip      Lumbar Exercises: Aerobic   Nustep  L2 x 6 min LEs only      Lumbar Exercises: Supine   Bridge with clamshell  10 reps    Bridge with [employer]  Limitations  red TB around knees   cues to avoid pushing into pain     Lumbar Exercises: Sidelying   Clam  Right;Left;10 reps    Clam Limitations  with red TB   cues to avoid rolling hips back     Knee/Hip Exercises: Stretches   ITB Stretch  Left;1 rep;30 seconds    ITB Stretch Limitations  with strap to tolerance      Knee/Hip Exercises: Machines for Strengthening   Cybex Knee Extension  15# x15 B LEs   cues for rhythmic breathing and core contraction   Cybex Knee Flexion  20# x15 B LEs      Knee/Hip Exercises: Seated   Other Seated Knee/Hip Exercises  TrA contraction 10x5"      Manual Therapy   Manual Therapy  Soft tissue mobilization;Myofascial release    Soft tissue mobilization  STM to L piriformis, superior glute, lateral glute- TTP throughout and with palpable trigger points    Myofascial Release  manual TPR to L piriformis and superior glute             PT Education - 09/13/18 1656    Education Details  update to HEP; red TB administered    Person(s) Educated  Patient    Methods  Explanation;Demonstration;Tactile cues;Verbal cues;Handout    Comprehension  Verbalized understanding;Returned demonstration        PT Short Term Goals - 09/13/18 1700      PT SHORT TERM GOAL #1   Title  Pt will be independent with initial HEP     Time  2    Period  Weeks    Status  On-going    Target Date  09/12/18        PT Long Term Goals - 09/13/18 1700      PT LONG TERM GOAL #1   Title  Pt will be independent with advanced HEP program     Time  4    Period  Weeks    Status  On-going      PT LONG TERM GOAL #2   Title  Patient to demonstrate lumbar AROM WFL and without pain limiting.     Time  4    Period  Weeks    Status  On-going      PT LONG TERM GOAL #3   Title  Patient to demonstrate B hip strength >=4+/5.    Time  4    Period  Weeks    Status  On-going      PT LONG TERM GOAL #4   Title  Patient to recall and demonstrate proper lifting mechanics to lift 10lb box from ground.     Time  4    Period  Weeks    Status  On-going      PT LONG TERM GOAL #5   Title  Patient to return to light gym activities without pain limiting.     Time  4    Period  Weeks    Status  On-going            Plan - 09/13/18 1657    Clinical Impression Statement  Patient reports that she has had more LB and L hip pain recently, believes it is d/t the HEP. Notes that she also has been working more overtime- current job requires a mainly sitting, some walking. Tolerated STM and manual TPR to L piriformis and glute- reported decreased tightness. Educated patient on use of ball on wall massage to  L glute for pain relief. Able to progress clamshells with red band and introduced bridge with red TB. Patient apprehensive about performing bridge as she has previously had trouble with this exercise, however, able to perform this pain-free. Introduced TrA contraction with cues for proper palpation and activation of muscle. Updated HEP with exercises that were well-tolerated today. Patient reported understanding. Patient reporting improvement in pain levels at end of session.     Rehab Potential  Good    Clinical  Impairments Affecting Rehab Potential  HA, L TKA 2017, R TKA 2019, ankle fusion    PT Frequency  1x / week    PT Duration  4 weeks    PT Treatment/Interventions  ADLs/Self Care Home Management;Cryotherapy;Electrical Stimulation;Iontophoresis 32m/ml Dexamethasone;Functional mobility training;Stair training;Gait training;DME Instruction;Ultrasound;Traction;Moist Heat;Therapeutic activities;Therapeutic exercise;Balance training;Neuromuscular re-education;Patient/family education;Passive range of motion;Manual techniques;Dry needling;Energy conservation;Splinting;Taping;Vasopneumatic Device;Vestibular    PT Next Visit Plan  address lifting and body mechanics, abdominal bracing    Consulted and Agree with Plan of Care  Patient       Patient will benefit from skilled therapeutic intervention in order to improve the following deficits and impairments:  Decreased activity tolerance, Decreased strength, Pain, Increased muscle spasms, Decreased mobility, Difficulty walking, Improper body mechanics, Decreased range of motion, Impaired flexibility, Postural dysfunction  Visit Diagnosis: Chronic bilateral low back pain without sciatica  Pain in left hip  Muscle weakness (generalized)  Other symptoms and signs involving the musculoskeletal system     Problem List Patient Active Problem List   Diagnosis Date Noted  . S/P total knee arthroplasty, right 01/08/2018  . Primary localized osteoarthritis of right knee 01/03/2018  . Spinal stenosis of lumbar region 05/30/2017  . Postoperative anemia due to acute blood loss 10/11/2015  . Arthritis 10/11/2015  . Constipation 10/11/2015  . Headache 10/11/2015  . Osteoarthritis of left knee 10/07/2015  . Obesity 10/07/2015  . S/P total knee replacement using cement 10/07/2015     YJanene Harvey PT, DPT 09/13/18 5:01 PM   [facility] [address] [place]  [id number]: [phone]  Fax:  [phone] Name: SBRANDY ZUBAMRN: [id number] of Birth: 1Aug 30, 1952 PHYSICAL THERAPY DISCHARGE SUMMARY  Visits from Start of Care: 2  Current functional level related to goals / functional outcomes: Unable to assess; patient did not return d/t concern about COVID-19   Remaining deficits: Unable to assess   Education / Equipment: HEP  Plan: Patient agrees to discharge.  Patient goals were not met. Patient is being discharged due to not returning since the last visit.  ?????     YJanene Harvey PT, DPT 10/16/18 1:52 PM

## 2018-09-25 ENCOUNTER — Ambulatory Visit (INDEPENDENT_AMBULATORY_CARE_PROVIDER_SITE_OTHER): Payer: BLUE CROSS/BLUE SHIELD | Admitting: Orthopaedic Surgery

## 2018-09-27 ENCOUNTER — Encounter: Payer: BLUE CROSS/BLUE SHIELD | Admitting: Physical Therapy

## 2018-11-20 ENCOUNTER — Ambulatory Visit (INDEPENDENT_AMBULATORY_CARE_PROVIDER_SITE_OTHER): Payer: BLUE CROSS/BLUE SHIELD | Admitting: Family Medicine

## 2018-11-20 ENCOUNTER — Encounter: Payer: Self-pay | Admitting: Family Medicine

## 2018-11-20 VITALS — HR 95 | Ht 66.0 in | Wt 234.0 lb

## 2018-11-20 DIAGNOSIS — R000 Tachycardia, unspecified: Secondary | ICD-10-CM | POA: Diagnosis not present

## 2018-11-20 NOTE — Progress Notes (Addendum)
Chief Complaint  Patient presents with  . New Patient (Initial Visit)    Discuss increased heart rate       New Patient Visit SUBJECTIVE: HPI: Lauren Frye is an 68 y.o.female who is being seen for establishing care. Due to COVID-19 pandemic, we are interacting via web portal for an electronic face-to-face visit. I verified patient's ID using 2 identifiers. Patient agreed to proceed with visit via this method. Patient is at home, I am at office. Patient and I are present for visit.   2 mo has been having issues with racing heart beat. Lasts several min. No triggers. Occasional assoc lightheadedness. No cardiac hx. Famhx of HTN, one sister had open heart surgery, unknown why. No CP or SOB. 10 yrs ago had similar issue, but due to excess caffeine, no changes recently. No changes in meds or oral intake. No areas of easy bruising/bleeding that started since this happened. Does not follow with cards. Does not feel palpitations. No alcohol or illicit drug use.   Allergies  Allergen Reactions  . Codeine Anaphylaxis, Hives, Itching and Swelling    Possible swelling in throat  . Penicillins Anaphylaxis    Has patient had a PCN reaction causing immediate rash, facial/tongue/throat swelling, SOB or lightheadedness with hypotension: Unknown Has patient had a PCN reaction causing severe rash involving mucus membranes or skin necrosis: Unknown Has patient had a PCN reaction that required hospitalization: Unknown--treated in ER Has patient had a PCN reaction occurring within the last 10 years: No CHILDHOOD REACTION. If all of the above answers are "NO", then may proceed with Cephalosporin use.   Marland Kitchen. Propoxyphene Hives, Itching and Swelling    POSSIBLE swelling in throat  . Prednisone Swelling    Undefined as to specifics    Past Medical History:  Diagnosis Date  . Arthritis   . Depression    denies  . Headache    migraines   Past Surgical History:  Procedure Laterality Date  . ABDOMINAL  HYSTERECTOMY    . ANKLE FUSION    . BOWEL RESECTION    . KNEE ARTHROSCOPY    . KNEE SURGERY Bilateral    LEFT ARTHROCOPY  X3    ,  RT X1  . TONSILLECTOMY    . TOTAL KNEE ARTHROPLASTY Left 10/07/2015  . TOTAL KNEE ARTHROPLASTY Left 10/07/2015   Procedure: TOTAL KNEE ARTHROPLASTY;  Surgeon: Valeria BatmanPeter W Whitfield, MD;  [facility];  Service: Orthopedics;  Laterality: Left;  . TOTAL KNEE ARTHROPLASTY Right 01/03/2018  . TOTAL KNEE ARTHROPLASTY Right 01/03/2018   Procedure: RIGHT TOTAL KNEE ARTHROPLASTY;  Surgeon: Valeria BatmanWhitfield, Peter W, MD;  [facility];  Service: Orthopedics;  Laterality: Right;   Family History  Problem Relation Age of Onset  . Parkinson's disease Mother   . COPD Mother   . Diabetes Sister    Allergies  Allergen Reactions  . Codeine Anaphylaxis, Hives, Itching and Swelling    Possible swelling in throat  . Penicillins Anaphylaxis    Has patient had a PCN reaction causing immediate rash, facial/tongue/throat swelling, SOB or lightheadedness with hypotension: Unknown Has patient had a PCN reaction causing severe rash involving mucus membranes or skin necrosis: Unknown Has patient had a PCN reaction that required hospitalization: Unknown--treated in ER Has patient had a PCN reaction occurring within the last 10 years: No CHILDHOOD REACTION. If all of the above answers are "NO", then may proceed with Cephalosporin use.   Marland Kitchen. Propoxyphene Hives, Itching and Swelling    POSSIBLE  swelling in throat  . Prednisone Swelling    Undefined as to specifics    Current Outpatient Medications:  .  FERROUS GLUCONATE IRON PO, Take 240 mg by mouth daily., Disp: , Rfl:  .  ibuprofen (ADVIL,MOTRIN) 800 MG tablet, Take 1 tablet (800 mg total) by mouth every 8 (eight) hours as needed. (Patient taking differently: Take 800 mg by mouth every 8 (eight) hours as needed. Takes when knee hurts), Disp: 30 tablet, Rfl: 0 .  Multiple Vitamins-Minerals (STRESS TAB NF PO), Take 1 tablet by mouth  daily., Disp: , Rfl:  .  Probiotic Product (PROBIOTIC ADVANCED PO), Take 3 capsules by mouth daily., Disp: , Rfl:   No LMP recorded. Patient has had a hysterectomy.  ROS Cardiovascular: Denies chest pain  Respiratory: Denies dyspnea   OBJECTIVE: No conversational dyspnea Age appropriate judgment and insight Nml affect and mood  ASSESSMENT/PLAN: Tachycardia - Plan: TSH, T4, free, CBC  Ck labs. If neg, will get Holter.  Patient should return for labs. The patient voiced understanding and agreement to the plan.   Jilda Roche Wilder, DO 11/20/18  2:49 PM

## 2018-11-28 ENCOUNTER — Other Ambulatory Visit: Payer: Self-pay

## 2018-11-28 ENCOUNTER — Other Ambulatory Visit (INDEPENDENT_AMBULATORY_CARE_PROVIDER_SITE_OTHER): Payer: BLUE CROSS/BLUE SHIELD

## 2018-11-28 DIAGNOSIS — R000 Tachycardia, unspecified: Secondary | ICD-10-CM | POA: Diagnosis not present

## 2018-11-29 ENCOUNTER — Other Ambulatory Visit: Payer: Self-pay | Admitting: Family Medicine

## 2018-11-29 DIAGNOSIS — R000 Tachycardia, unspecified: Secondary | ICD-10-CM

## 2018-11-29 LAB — CBC
HCT: 40.6 % (ref 36.0–46.0)
Hemoglobin: 14 g/dL (ref 12.0–15.0)
MCHC: 34.4 g/dL (ref 30.0–36.0)
MCV: 91.4 fl (ref 78.0–100.0)
Platelets: 286 10*3/uL (ref 150.0–400.0)
RBC: 4.44 Mil/uL (ref 3.87–5.11)
RDW: 13.3 % (ref 11.5–15.5)
WBC: 10.2 10*3/uL (ref 4.0–10.5)

## 2018-11-29 LAB — T4, FREE: Free T4: 0.83 ng/dL (ref 0.60–1.60)

## 2018-11-29 LAB — TSH: TSH: 1.67 u[IU]/mL (ref 0.35–4.50)

## 2018-12-04 ENCOUNTER — Telehealth: Payer: Self-pay | Admitting: *Deleted

## 2018-12-04 NOTE — Telephone Encounter (Signed)
Copied from CRM [id number]. Topic: Referral - Status >> Dec 04, 2018 10:01 AM Crist Infante wrote: Reason for CRM: pt following up on request for holter monitor.  Pt states her heart rate is all over the place.  This weekend went from 51 to over 100. Pt would like asap.

## 2018-12-04 NOTE — Telephone Encounter (Signed)
Have checked with [facility] and she will send the order to Cardiology.

## 2018-12-05 ENCOUNTER — Telehealth: Payer: Self-pay | Admitting: *Deleted

## 2018-12-05 NOTE — Telephone Encounter (Signed)
    COVID-19 Pre-Screening Questions:  . In the past 7 to 10 days have you had a cough,  shortness of breath, headache, congestion, fever (100 or greater) body aches, chills, sore throat, or sudden loss of taste or sense of smell? SOB with racing heart only, this is why she is getting heart monitor. . Have you been around anyone with known Covid 19? NO . Have you been around anyone who is awaiting Covid 19 test results in the past 7 to 10 days? NO . Have you been around anyone who has been exposed to Covid 19, or has mentioned symptoms of Covid 19 within the past 7 to 10 days? NO  If you have any concerns/questions about symptoms patients report during screening (either on the phone or at threshold). Contact the provider seeing the patient or DOD for further guidance.  If neither are available contact a member of the leadership team.

## 2018-12-05 NOTE — Telephone Encounter (Signed)

## 2018-12-06 ENCOUNTER — Ambulatory Visit (INDEPENDENT_AMBULATORY_CARE_PROVIDER_SITE_OTHER): Payer: BLUE CROSS/BLUE SHIELD

## 2018-12-06 ENCOUNTER — Other Ambulatory Visit: Payer: Self-pay

## 2018-12-06 ENCOUNTER — Other Ambulatory Visit: Payer: Self-pay | Admitting: Family Medicine

## 2018-12-06 DIAGNOSIS — R000 Tachycardia, unspecified: Secondary | ICD-10-CM

## 2018-12-21 ENCOUNTER — Telehealth: Payer: Self-pay | Admitting: Family Medicine

## 2018-12-21 NOTE — Telephone Encounter (Signed)
The patient stated she mailed back the heart monitor one week ago and has not received her results yet

## 2018-12-21 NOTE — Telephone Encounter (Signed)
A cardiologist still has to read it. If she doesn't hear anything in the next week or so, please reach out. Usually I am notified when final read is done. Ty.

## 2018-12-21 NOTE — Telephone Encounter (Signed)
Informed the patient and she verbalized understanding.

## 2019-06-06 ENCOUNTER — Other Ambulatory Visit: Payer: Self-pay

## 2019-06-06 ENCOUNTER — Ambulatory Visit: Payer: Self-pay

## 2019-06-06 ENCOUNTER — Ambulatory Visit (INDEPENDENT_AMBULATORY_CARE_PROVIDER_SITE_OTHER): Payer: BC Managed Care – PPO | Admitting: Orthopedic Surgery

## 2019-06-06 DIAGNOSIS — M4317 Spondylolisthesis, lumbosacral region: Secondary | ICD-10-CM

## 2019-06-06 DIAGNOSIS — M25551 Pain in right hip: Secondary | ICD-10-CM

## 2019-06-06 DIAGNOSIS — M25552 Pain in left hip: Secondary | ICD-10-CM

## 2019-06-06 DIAGNOSIS — M7062 Trochanteric bursitis, left hip: Secondary | ICD-10-CM | POA: Diagnosis not present

## 2019-06-08 ENCOUNTER — Encounter: Payer: Self-pay | Admitting: Orthopedic Surgery

## 2019-06-08 NOTE — Progress Notes (Signed)
Office Visit Note   Patient: Lauren Frye           Date of Birth: Dec 16, 1950           MRN: [id number] Visit Date: 06/06/2019 Requested by: Shelda Pal, [address] [place],  [place] [address] PCP: Shelda Pal, DO  Subjective: Chief Complaint  Patient presents with  . Back Pain    HPI: Lauren Frye is a 68 y.o. female who presents to the office complaining of bilateral hip and low back pain.  She notes significant lumbar back pain that has been significantly worse in the last week.  She notes the pain wakes her up at night.  She has been taking Aleve as needed with minimal relief and has had to take a friend's pain medication in order to tolerate it once or twice.  She has been previously treated by Dr. Durward Fortes for stenosis.  She has tried injections with Dr. Ernestina Patches in the past without relief, but this was 2 years ago.  She denies any red flag symptoms of back pain or any radicular pain that travels past her buttocks.  Patient denies any history of back surgery.  She has had ESI's but they did not provide her relief for even 1 day.  She has half hour walking endurance.  Patient also notes bilateral hip pain.  She localizes this to the lateral hip near the trochanteric bursa's.  She denies any groin pain or radicular pain in her groin or past her groin.  She has had previous trochanteric bursa injections by Dr. Durward Fortes that have provided about 6 months of relief each time.  Her last injection was over 6 months ago..                ROS:  All systems reviewed are negative as they relate to the chief complaint within the history of present illness.  Patient denies fevers or chills.  Assessment & Plan: Visit Diagnoses:  1. Spondylolisthesis at L5-S1 level   2. Greater trochanteric pain syndrome of both lower extremities     Plan: Patient is a 68 year old female who presents with history of back pain and with bilateral hip pain.  Based on  history and physical impression is bilateral trochanteric bursitis is the cause of her hip pain and increased spondylolisthesis is the cause of her back pain.  The spondylolisthesis seems to have progressed on today's x-rays compared with the comparison MRI from 2018.  Discussed options available to patient including injections versus surgery versus more conservative management.  For the hip pain patient requests injection.  Patient had left trochanteric bursa injection under ultrasound and tolerated the procedure well without any complications.  As for the back pain, offered to refer patient for epidural steroid injections but patient declined.  She will follow-up as needed.  Follow-Up Instructions: No follow-ups on file.   Orders:  Orders Placed This Encounter  Procedures  . XR Lumbar Spine 2-3 Views   No orders of the defined types were placed in this encounter.     Procedures: Large Joint Inj: L greater trochanter on 06/17/2019 8:41 AM Indications: pain and diagnostic evaluation Details: 18 G 3.5 in needle, ultrasound-guided lateral approach  Arthrogram: No  Medications: 5 mL lidocaine 1 %; 80 mg methylPREDNISolone acetate 80 MG/ML; 4 mL bupivacaine 0.25 % Outcome: tolerated well, no immediate complications Procedure, treatment alternatives, risks and benefits explained, specific risks discussed. Consent was given by the patient.  Immediately prior to procedure a time out was called to verify the correct patient, procedure, equipment, support staff and site/side marked as required. Patient was prepped and draped in the usual sterile fashion.       Clinical Data: No additional findings.  Objective: Vital Signs: There were no vitals taken for this visit.  Physical Exam:  Constitutional: Patient appears well-developed HEENT:  Head: Normocephalic Eyes:EOM are normal Neck: Normal range of motion Cardiovascular: Normal rate Pulmonary/chest: Effort normal Neurologic: Patient is  alert Skin: Skin is warm Psychiatric: Patient has normal mood and affect  Ortho Exam:  Back exam Significant tenderness through the distal lumbar spine Significant tenderness over the left paraspinal musculature.  Mild tenderness over the right paraspinal musculature 5/5 motor strength of the bilateral hip flexors, quadriceps, hamstrings, dorsiflexion, plantarflexion.  Sensation intact through all dermatomes of the bilateral lower extremities.  Negative straight leg raise bilaterally. Tenderness to palpation severely over the bilateral trochanteric bursae (left greater than right)  Specialty Comments:  No specialty comments available.  Imaging: No results found.   PMFS History: Patient Active Problem List   Diagnosis Date Noted  . S/P total knee arthroplasty, right 01/08/2018  . Primary localized osteoarthritis of right knee 01/03/2018  . Spinal stenosis of lumbar region 05/30/2017  . Postoperative anemia due to acute blood loss 10/11/2015  . Arthritis 10/11/2015  . Constipation 10/11/2015  . Headache 10/11/2015  . Osteoarthritis of left knee 10/07/2015  . Obesity 10/07/2015  . 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 Arthritis   . Depression    denies  . Headache    migraines    Family History  Problem Relation Age of Onset  . Parkinson's disease Mother   . COPD Mother   . Diabetes Sister     Past Surgical History:  Procedure Laterality Date  . ABDOMINAL HYSTERECTOMY    . ANKLE FUSION    . BOWEL RESECTION    . KNEE ARTHROSCOPY    . KNEE SURGERY Bilateral    LEFT ARTHROCOPY  X3    ,  RT X1  . TONSILLECTOMY    . TOTAL KNEE ARTHROPLASTY Left 10/07/2015  . TOTAL KNEE ARTHROPLASTY Left 10/07/2015   Procedure: TOTAL KNEE ARTHROPLASTY;  Surgeon: Valeria Batman, MD;  Location: [facility];  Service: Orthopedics;  Laterality: Left;  . TOTAL KNEE ARTHROPLASTY Right 01/03/2018  . TOTAL KNEE ARTHROPLASTY Right 01/03/2018    Procedure: RIGHT TOTAL KNEE ARTHROPLASTY;  Surgeon: Valeria Batman, MD;  Location: [facility] OR;  Service: Orthopedics;  Laterality: Right;   Social History   Occupational History  . Not on file  Tobacco Use  . Smoking status: Never Smoker  . Smokeless tobacco: Never Used  Substance and Sexual Activity  . Alcohol use: Yes    Alcohol/week: 1.0 standard drinks    Types: 1 Glasses of wine per week    Comment: ONCE A week  . Drug use: No  . Sexual activity: Not Currently    Birth control/protection: Abstinence

## 2019-06-17 ENCOUNTER — Encounter: Payer: Self-pay | Admitting: Orthopedic Surgery

## 2019-06-17 DIAGNOSIS — M7062 Trochanteric bursitis, left hip: Secondary | ICD-10-CM

## 2019-06-17 DIAGNOSIS — M25552 Pain in left hip: Secondary | ICD-10-CM | POA: Diagnosis not present

## 2019-06-17 MED ORDER — LIDOCAINE HCL 1 % IJ SOLN
5.0000 mL | INTRAMUSCULAR | Status: AC | PRN
  Administered 2019-06-17: 5 mL

## 2019-06-17 MED ORDER — METHYLPREDNISOLONE ACETATE 80 MG/ML IJ SUSP
80.0000 mg | INTRAMUSCULAR | Status: AC | PRN
  Administered 2019-06-17: 80 mg via INTRA_ARTICULAR

## 2019-06-17 MED ORDER — BUPIVACAINE HCL 0.25 % IJ SOLN
4.0000 mL | INTRAMUSCULAR | Status: AC | PRN
  Administered 2019-06-17: 4 mL via INTRA_ARTICULAR

## 2019-07-18 ENCOUNTER — Ambulatory Visit: Payer: BC Managed Care – PPO | Admitting: Orthopedic Surgery

## 2020-01-24 ENCOUNTER — Encounter: Payer: Self-pay | Admitting: Orthopaedic Surgery

## 2020-01-24 ENCOUNTER — Ambulatory Visit: Payer: BC Managed Care – PPO | Admitting: Orthopaedic Surgery

## 2020-01-24 ENCOUNTER — Ambulatory Visit: Payer: Self-pay

## 2020-01-24 DIAGNOSIS — M7541 Impingement syndrome of right shoulder: Secondary | ICD-10-CM | POA: Insufficient documentation

## 2020-01-24 DIAGNOSIS — M25511 Pain in right shoulder: Secondary | ICD-10-CM | POA: Diagnosis not present

## 2020-01-24 NOTE — Progress Notes (Signed)
Office Visit Note   Patient: Lauren Frye           Date of Birth: 09/17/1950           MRN: [id number] Visit Date: 01/24/2020              Requested by: Sharlene Dory, DO [address] [place],  [place] [address] PCP: Sharlene Dory, DO   Assessment & Plan: Visit Diagnoses:  1. Right shoulder pain, unspecified chronicity   2. Impingement syndrome of right shoulder     Plan: #1: Subacromial corticosteroid injection was given to the right shoulder atraumatically.  Still having pain with motion post injection. #2: We will see how she does over the next several weeks and if this is not improved then we should proceed with an MRI scan of the right shoulder to rule out rotator cuff pathology/biceps tendon pathology.  Follow-Up Instructions: No follow-ups on file.   Orders:  Orders Placed This Encounter  Procedures  . XR Shoulder Right   No orders of the defined types were placed in this encounter.     Procedures: No procedures performed   Clinical Data: No additional findings.   Subjective: Chief Complaint  Patient presents with  . Right Shoulder - Pain    HPI  Lauren Frye is a very pleasant 69 year old white female who presents today with right shoulder pain.  She states approximately 2 months ago she was driving home from a trip up [place] when she started to sneeze.  At that time the tissues were in the backseat she violently reached into the back seat to grab the tissues and had significant pain discomfort in the right shoulder.  She has had intermittent pain and discomfort in the right shoulder especially with motion and weight bearing.  She has not been seen for this previously.  Review of Systems   Objective: Vital Signs: There were no vitals taken for this visit.  Physical Exam  Ortho Exam  Exam today reveals a some tenderness over the biceps tendon anteriorly.  Empty can test is only mildly positive.  She does have strength  but does have pain with resistance.  There is some crepitance with range of motion also noted.  Specialty Comments:  No specialty comments available.  Imaging: No results found.   PMFS History: Current Outpatient Medications  Medication Sig Dispense Refill  . B Complex-C-Folic Acid (STRESS FORMULA) TABS Take by mouth.    Marland Kitchen ibuprofen (ADVIL,MOTRIN) 800 MG tablet Take 1 tablet (800 mg total) by mouth every 8 (eight) hours as needed. (Patient taking differently: Take 800 mg by mouth every 8 (eight) hours as needed. Takes when knee hurts) 30 tablet 0  . Multiple Vitamins-Minerals (STRESS TAB NF PO) Take 1 tablet by mouth daily.    . Probiotic Product (PROBIOTIC ADVANCED PO) Take 3 capsules by mouth daily.     No current facility-administered medications for this visit.    Patient Active Problem List   Diagnosis Date Noted  . Impingement syndrome of right shoulder 01/24/2020  . S/P total knee arthroplasty, right 01/08/2018  . Primary localized osteoarthritis of right knee 01/03/2018  . Spinal stenosis of lumbar region 05/30/2017  . Postoperative anemia due to acute blood loss 10/11/2015  . Arthritis 10/11/2015  . Constipation 10/11/2015  . Headache 10/11/2015  . Osteoarthritis of left knee 10/07/2015  . Obesity 10/07/2015  . 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 Arthritis   . Depression    denies  . Headache    migraines    Family History  Problem Relation Age of Onset  . Parkinson's disease Mother   . COPD Mother   . Diabetes Sister     Past Surgical History:  Procedure Laterality Date  . ABDOMINAL HYSTERECTOMY    . ANKLE FUSION    . BOWEL RESECTION    . KNEE ARTHROSCOPY    . KNEE SURGERY Bilateral    LEFT ARTHROCOPY  X3    ,  RT X1  . TONSILLECTOMY    . TOTAL KNEE ARTHROPLASTY Left 10/07/2015  . TOTAL KNEE ARTHROPLASTY Left 10/07/2015   Procedure: TOTAL KNEE ARTHROPLASTY;  Surgeon: Valeria Batman, MD;   Location: [facility] OR;  Service: Orthopedics;  Laterality: Left;  . TOTAL KNEE ARTHROPLASTY Right 01/03/2018  . TOTAL KNEE ARTHROPLASTY Right 01/03/2018   Procedure: RIGHT TOTAL KNEE ARTHROPLASTY;  Surgeon: Valeria Batman, MD;  Location: [facility] OR;  Service: Orthopedics;  Laterality: Right;   Social History   Occupational History  . Not on file  Tobacco Use  . Smoking status: Never Smoker  . Smokeless tobacco: Never Used  Vaping Use  . Vaping Use: Never used  Substance and Sexual Activity  . Alcohol use: Yes    Alcohol/week: 1.0 standard drink    Types: 1 Glasses of wine per week    Comment: ONCE A week  . Drug use: No  . Sexual activity: Not Currently    Birth control/protection: Abstinence

## 2020-01-29 ENCOUNTER — Telehealth: Payer: Self-pay | Admitting: Orthopaedic Surgery

## 2020-01-29 DIAGNOSIS — M7541 Impingement syndrome of right shoulder: Secondary | ICD-10-CM

## 2020-01-29 DIAGNOSIS — M25511 Pain in right shoulder: Secondary | ICD-10-CM

## 2020-01-29 NOTE — Telephone Encounter (Signed)
Please advise. Shoulder injection performed 01/24/2020.

## 2020-01-29 NOTE — Telephone Encounter (Signed)
Okay for MRI scan right shoulder

## 2020-01-29 NOTE — Telephone Encounter (Signed)
Order entered. Patient aware. 

## 2020-01-29 NOTE — Telephone Encounter (Signed)
Patient called advised the injection did not work and would like to be set up for an MRI. The number to contact patient is [phone]

## 2020-02-02 ENCOUNTER — Ambulatory Visit
Admission: RE | Admit: 2020-02-02 | Discharge: 2020-02-02 | Disposition: A | Discharge status: Home / Self Care | Payer: BC Managed Care – PPO | Source: Ambulatory Visit | Attending: Orthopaedic Surgery | Admitting: Orthopaedic Surgery

## 2020-02-02 DIAGNOSIS — M25511 Pain in right shoulder: Secondary | ICD-10-CM

## 2020-02-02 DIAGNOSIS — M7541 Impingement syndrome of right shoulder: Secondary | ICD-10-CM

## 2020-02-06 ENCOUNTER — Encounter: Payer: Self-pay | Admitting: Orthopaedic Surgery

## 2020-02-06 ENCOUNTER — Ambulatory Visit: Payer: BC Managed Care – PPO | Admitting: Orthopaedic Surgery

## 2020-02-06 ENCOUNTER — Other Ambulatory Visit: Payer: Self-pay

## 2020-02-06 VITALS — Ht 66.0 in | Wt 225.0 lb

## 2020-02-06 DIAGNOSIS — M7541 Impingement syndrome of right shoulder: Secondary | ICD-10-CM

## 2020-02-06 DIAGNOSIS — M25511 Pain in right shoulder: Secondary | ICD-10-CM

## 2020-02-06 DIAGNOSIS — G8929 Other chronic pain: Secondary | ICD-10-CM | POA: Diagnosis not present

## 2020-02-06 NOTE — Progress Notes (Signed)
Office Visit Note   Patient: Lauren Frye           Date of Birth: Dec 07, 1950           MRN: [id number] Visit Date: 02/06/2020              Requested by: Sharlene Dory, DO [address] [place],  [place] [address] PCP: Sharlene Dory, DO   Assessment & Plan: Visit Diagnoses:  1. Chronic right shoulder pain   2. Impingement syndrome of right shoulder     Plan: MRI scan demonstrates mild supra and infraspinatus tendinosis.  There is a small rim rent insertional tear near the interdigitating fibers but no full-thickness rotator cuff tear.  The glenohumeral joint was intact.  Labrum was intact.  Long head of the biceps intact.  Mild AC joint arthritis.  Subacromial cortisone injection was not helpful.  We will try Voltaren gel and a course of physical therapy and return in 1 month.  Consider arthroscopic debridement only as a last resort  Follow-Up Instructions: Return in about 1 month (around 03/08/2020).   Orders:  Orders Placed This Encounter  Procedures  . Ambulatory referral to Physical Therapy   No orders of the defined types were placed in this encounter.     Procedures: No procedures performed   Clinical Data: No additional findings.   Subjective: Chief Complaint  Patient presents with  . Right Shoulder - Follow-up    MRI review  Patient presents today for follow up on her right shoulder. She had an MRI performed on 02/02/2020. She is here today to discuss those results. Patient states that her shoulder has continue to worsen since her last visit. She takes Aleve as needed, but states that it does not help. She is right hand dominant.   HPI  Review of Systems   Objective: Vital Signs: Ht 5\' 6"  (1.676 m)   Wt 225 lb (102.1 kg)   BMI 36.32 kg/m   Physical Exam Constitutional:      Appearance: She is well-developed.  Eyes:     Pupils: Pupils are equal, round, and reactive to light.  Pulmonary:     Effort: Pulmonary  effort is normal.  Skin:    General: Skin is warm and dry.  Neurological:     Mental Status: She is alert and oriented to person, place, and time.  Psychiatric:        Behavior: Behavior normal.     Ortho Exam awake alert and oriented x3.  Comfortable sitting.  Still has a mild impingement testing with the right shoulder but able to place her arm fully overhead with a mild circuitous arc of motion.  Pain along the anterior subacromial region but not at the [facility] joint.  Negative Speed sign.  Good strength.  No evidence of instability or adhesive capsulitis  Specialty Comments:  No specialty comments available.  Imaging: No results found.   PMFS History: Patient Active Problem List   Diagnosis Date Noted  . Impingement syndrome of right shoulder 01/24/2020  . S/P total knee arthroplasty, right 01/08/2018  . Primary localized osteoarthritis of right knee 01/03/2018  . Spinal stenosis of lumbar region 05/30/2017  . Postoperative anemia due to acute blood loss 10/11/2015  . Arthritis 10/11/2015  . Constipation 10/11/2015  . Headache 10/11/2015  . Osteoarthritis of left knee 10/07/2015  . Obesity 10/07/2015  . S/P total knee replacement using cement 10/07/2015   Past Medical History:  Diagnosis  Date  . Arthritis   . Depression    denies  . Headache    migraines    Family History  Problem Relation Age of Onset  . Parkinson's disease Mother   . COPD Mother   . Diabetes Sister     Past Surgical History:  Procedure Laterality Date  . ABDOMINAL HYSTERECTOMY    . ANKLE FUSION    . BOWEL RESECTION    . KNEE ARTHROSCOPY    . KNEE SURGERY Bilateral    LEFT ARTHROCOPY  X3    ,  RT X1  . TONSILLECTOMY    . TOTAL KNEE ARTHROPLASTY Left 10/07/2015  . TOTAL KNEE ARTHROPLASTY Left 10/07/2015   Procedure: TOTAL KNEE ARTHROPLASTY;  Surgeon: Valeria Batman, MD;  Location: [facility] OR;  Service: Orthopedics;  Laterality: Left;  . TOTAL KNEE ARTHROPLASTY Right 01/03/2018  . TOTAL KNEE  ARTHROPLASTY Right 01/03/2018   Procedure: RIGHT TOTAL KNEE ARTHROPLASTY;  Surgeon: Valeria Batman, MD;  Location: [facility] OR;  Service: Orthopedics;  Laterality: Right;   Social History   Occupational History  . Not on file  Tobacco Use  . Smoking status: Never Smoker  . Smokeless tobacco: Never Used  Vaping Use  . Vaping Use: Never used  Substance and Sexual Activity  . Alcohol use: Yes    Alcohol/week: 1.0 standard drink    Types: 1 Glasses of wine per week    Comment: ONCE A week  . Drug use: No  . Sexual activity: Not Currently    Birth control/protection: Abstinence

## 2020-02-12 ENCOUNTER — Other Ambulatory Visit: Payer: Self-pay

## 2020-02-12 ENCOUNTER — Ambulatory Visit: Payer: BC Managed Care – PPO | Attending: Orthopaedic Surgery | Admitting: Physical Therapy

## 2020-02-12 ENCOUNTER — Encounter: Payer: Self-pay | Admitting: Physical Therapy

## 2020-02-12 DIAGNOSIS — G8929 Other chronic pain: Secondary | ICD-10-CM | POA: Diagnosis present

## 2020-02-12 DIAGNOSIS — M25511 Pain in right shoulder: Secondary | ICD-10-CM | POA: Diagnosis present

## 2020-02-12 DIAGNOSIS — M6281 Muscle weakness (generalized): Secondary | ICD-10-CM | POA: Diagnosis present

## 2020-02-12 DIAGNOSIS — R29898 Other symptoms and signs involving the musculoskeletal system: Secondary | ICD-10-CM | POA: Diagnosis present

## 2020-02-12 DIAGNOSIS — M25611 Stiffness of right shoulder, not elsewhere classified: Secondary | ICD-10-CM | POA: Diagnosis present

## 2020-02-12 NOTE — Therapy (Signed)
[facility] [address]  [address] [place], [place], [address] Phone: [phone]   Fax:  [phone]  Physical Therapy Evaluation  Patient Details  Name: Lauren Frye MRN: [id number] Date of Birth: April 22, 1951 Referring Provider (PT): [name], MD   Encounter Date: 02/12/2020   PT End of Session - 02/12/20 1733    Visit Number 1    Number of Visits 9    Date for PT Re-Evaluation 03/25/20    Authorization Type [employer]    PT Start Time 1649    PT Stop Time 1722    PT Time Calculation (min) 33 min    Activity Tolerance Patient limited by pain    Behavior During Therapy [facility] for tasks assessed/performed           Past Medical History:  Diagnosis Date  . Arthritis   . Depression    denies  . Headache    migraines    Past Surgical History:  Procedure Laterality Date  . ABDOMINAL HYSTERECTOMY    . ANKLE FUSION    . BOWEL RESECTION    . KNEE ARTHROSCOPY    . KNEE SURGERY Bilateral    LEFT ARTHROCOPY  X3    ,  RT X1  . TONSILLECTOMY    . TOTAL KNEE ARTHROPLASTY Left 10/07/2015  . TOTAL KNEE ARTHROPLASTY Left 10/07/2015   Procedure: TOTAL KNEE ARTHROPLASTY;  Surgeon: Valeria Batman, MD;  Location: [facility] OR;  Service: Orthopedics;  Laterality: Left;  . TOTAL KNEE ARTHROPLASTY Right 01/03/2018  . TOTAL KNEE ARTHROPLASTY Right 01/03/2018   Procedure: RIGHT TOTAL KNEE ARTHROPLASTY;  Surgeon: Valeria Batman, MD;  Location[facility] OR;  Service: Orthopedics;  Laterality: Right;    There were no vitals filed for this visit.    Subjective Assessment - 02/12/20 1651    Subjective Patient reports R shoulder pain since April. Was driving home from [place] and started to sneeze, so she swung her arm around to the backseat to grab a tissue. Reports that she felt a pop in her shoulder and that the pain has gotten worse since then. Pain is located over the anterior shoulder with radiation down to the elbow with reaching away from  her body. Also having trouble using a mouse at work. Denies N/T in the UEs.. No better with Voltaren cream or injection. Better with heat.    Pertinent History HA, L TKA 2017, R TKA 2019, ankle fusion    Limitations Lifting;House hold activities    Diagnostic tests 02/02/20 R shoulder MRI: Mild supraspinatus and infraspinatus tendinosis with tiny rim rent insertional tear near the interdigitating fibers. Nofull-thickness rotator cuff tear.    Patient Stated Goals decrease pain    Currently in Pain? Yes    Pain Score 5     Pain Location Shoulder    Pain Orientation Right;Anterior    Pain Descriptors / Indicators Stabbing;Aching    Pain Type Chronic pain              [facility] PT Assessment - 02/12/20 1656      Assessment   Medical Diagnosis Chronic R shoulder pain    Referring Provider (PT) [name], MD    Onset Date/Surgical Date 10/13/19    Hand Dominance Right    Next MD Visit not scheduled    Prior Therapy yes- for hip and knee      Precautions   Precautions None      Balance Screen  Has the patient fallen in the past 6 months Yes    How many times? 1   [place]   Has the patient had a decrease in activity level because of a fear of falling?  No    Is the patient reluctant to leave their home because of a fear of falling?  No      Home Environment   Living Environment Private residence    Living Arrangements Alone    Available Help at Discharge Family    Type of Home House      Prior Function   Level of Independence Independent    Vocation Full time employment    Vocation Requirements payroll- computer work    Leisure gym- Runner, broadcasting/film/videostrength training with Librarian, academicpersonal trainer      Cognition   Overall Cognitive Status Within Functional Limits for tasks assessed      Sensation   Light Touch Appears Intact      Coordination   Gross Motor Movements are Fluid and Coordinated Yes      Posture/Postural Control   Posture/Postural Control  Postural limitations    Postural Limitations Rounded Shoulders      ROM / Strength   AROM / PROM / Strength AROM;Strength      AROM   AROM Assessment Site Shoulder    Right/Left Shoulder Right;Left    Right Shoulder Flexion 131 Degrees    Right Shoulder ABduction 96 Degrees   severe pain    Right Shoulder Internal Rotation --   FIR T11   Right Shoulder External Rotation --   FER T3   Left Shoulder Flexion 126 Degrees    Left Shoulder ABduction 144 Degrees    Left Shoulder Internal Rotation --   FIR T8   Left Shoulder External Rotation --   FER T3     Strength   Strength Assessment Site Shoulder    Right/Left Shoulder Right;Left    Right Shoulder Flexion 3+/5    Right Shoulder ABduction 3+/5    Right Shoulder Internal Rotation 3+/5    Right Shoulder External Rotation 3+/5   severe pain   Left Shoulder Flexion 4+/5    Left Shoulder ABduction 4+/5    Left Shoulder Internal Rotation 4+/5    Left Shoulder External Rotation 4+/5      Palpation   Palpation comment very TTP over R UT, infraspinatus insertion, proximal triceps and biceps, and pec                      Objective measurements completed on examination: See above findings.               PT Education - 02/12/20 1733    Education Details prognosis, POC, HEP    Person(s) Educated Patient    Methods Explanation;Demonstration;Tactile cues;Verbal cues;Handout    Comprehension Verbalized understanding;Returned demonstration            PT Short Term Goals - 02/12/20 1738      PT SHORT TERM GOAL #1   Title Pt will be independent with initial HEP     Time 3    Period Weeks    Status New    Target Date 03/04/20             PT Long Term Goals - 02/12/20 1738      PT LONG TERM GOAL #1   Title Pt will be independent with advanced HEP program  Time 6    Period Weeks    Status New    Target Date 03/25/20      PT LONG TERM GOAL #2   Title Patient to demonstrate R shoulder AROM WFL  and without pain limiting.    Time 6    Period Weeks    Status New    Target Date 03/25/20      PT LONG TERM GOAL #3   Title Patient to demonstrate R shoulder strength >=4+/5.    Time 6    Period Weeks    Status New    Target Date 03/25/20      PT LONG TERM GOAL #4   Title Patient to report 0/10 pain in R shoulder after a full work day.    Time 6    Period Weeks    Status New    Target Date 03/25/20      PT LONG TERM GOAL #5   Title Patient to return to light gym activities without pain limiting.     Time 6    Period Weeks    Status New    Target Date 03/25/20                  Plan - 02/12/20 1734    Clinical Impression Statement Patient is a 68y/o F presenting to OPPT with c/o chronic R shoulder pain after reaching to the back seat of her car and feeling a pop in her shoulder in April. Pain occurs over the anterior shoulder with radiation down to the elbow. Worse when reaching out from her body and when using a computer mouse for long periods at work. Denies N/T. Patient today presenting with rounded shoulders, decreased and painful R shoulder AROM, decreased R shoulder strength, and very TTP over R UT, infraspinatus insertion, proximal triceps and biceps, and pec. Patient was educated on gentle AAROM and postural correction HEP and advised not to push into pain. Patient reported understanding. Would benefit from skilled PT services 2x/week for 2 weeks followed by 1x/week for 4 weeks to address aforementioned impairments.    Personal Factors and Comorbidities Age;Comorbidity 3+;Profession;Past/Current Experience;Time since onset of injury/illness/exacerbation    Comorbidities HA, L TKA 2017, R TKA 2019, ankle fusion    Examination-Activity Limitations Carry;Dressing;Hygiene/Grooming;Lift;Reach Overhead    Examination-Participation Restrictions Cleaning;Shop;Driving;Yard Work;Laundry;Meal Prep;Occupation    Stability/Clinical Decision Making Stable/Uncomplicated     Clinical Decision Making Low    Rehab Potential Good    PT Frequency Other (comment)   2x/week for 2 weeks, 1x/week for 4 weeks   PT Treatment/Interventions ADLs/Self Care Home Management;Cryotherapy;Electrical Stimulation;Iontophoresis 4mg /ml Dexamethasone;Moist Heat;Therapeutic exercise;Therapeutic activities;Functional mobility training;Ultrasound;Neuromuscular re-education;Patient/family education;Manual techniques;Vasopneumatic Device;Taping;Energy conservation;Dry needling;Passive range of motion    PT Next Visit Plan shoulder FOTO, gentle R shoulder AAROM/PROM and periscapular/RTC strengthening to tolerance    Consulted and Agree with Plan of Care Patient           Patient will benefit from skilled therapeutic intervention in order to improve the following deficits and impairments:  Increased edema, Decreased activity tolerance, Decreased strength, Increased fascial restricitons, Impaired UE functional use, Pain, Increased muscle spasms, Improper body mechanics, Decreased range of motion, Impaired flexibility, Postural dysfunction  Visit Diagnosis: Chronic right shoulder pain  Stiffness of right shoulder, not elsewhere classified  Muscle weakness (generalized)  Other symptoms and signs involving the musculoskeletal system     Problem List Patient Active Problem List   Diagnosis Date Noted  . Impingement syndrome of right shoulder 01/24/2020  .  S/P total knee arthroplasty, right 01/08/2018  . Primary localized osteoarthritis of right knee 01/03/2018  . Spinal stenosis of lumbar region 05/30/2017  . Postoperative anemia due to acute blood loss 10/11/2015  . Arthritis 10/11/2015  . Constipation 10/11/2015  . Headache 10/11/2015  . Osteoarthritis of left knee 10/07/2015  . Obesity 10/07/2015  . S/P total knee replacement using cement 10/07/2015     Anette Guarneri, PT, DPT 02/12/20 5:41 PM   [facility] [address]  [address] [place], [place], [address] Phone: [phone]   Fax:  [phone]  Name: Lauren Frye MRN: [id number] Date of Birth: 06-02-1951

## 2020-02-19 ENCOUNTER — Other Ambulatory Visit: Payer: Self-pay

## 2020-02-19 ENCOUNTER — Ambulatory Visit: Payer: BC Managed Care – PPO | Admitting: Physical Therapy

## 2020-02-19 ENCOUNTER — Encounter: Payer: Self-pay | Admitting: Physical Therapy

## 2020-02-19 DIAGNOSIS — M25511 Pain in right shoulder: Secondary | ICD-10-CM | POA: Diagnosis not present

## 2020-02-19 DIAGNOSIS — M6281 Muscle weakness (generalized): Secondary | ICD-10-CM

## 2020-02-19 DIAGNOSIS — R29898 Other symptoms and signs involving the musculoskeletal system: Secondary | ICD-10-CM

## 2020-02-19 DIAGNOSIS — G8929 Other chronic pain: Secondary | ICD-10-CM

## 2020-02-19 DIAGNOSIS — M25611 Stiffness of right shoulder, not elsewhere classified: Secondary | ICD-10-CM

## 2020-02-19 NOTE — Therapy (Signed)
[facility] Outpatient Rehabilitation [facility] [address]  [address] [place], [place], [address] Phone: [phone]   Fax:  [phone]  Physical Therapy Treatment  Patient Details  Name: Lauren Frye MRN: [id number] Date of Birth: [birth year] Referring Provider (PT): [name], MD   Encounter Date: 02/19/2020   PT End of Session - 02/19/20 1744    Visit Number 2    Number of Visits 9    Date for PT Re-Evaluation 03/25/20    Authorization Type BCBS    PT Start Time 1700    PT Stop Time 1750    PT Time Calculation (min) 50 min    Activity Tolerance Patient limited by pain;Patient tolerated treatment well    Behavior During Therapy [facility] for tasks assessed/performed           Past Medical History:  Diagnosis Date  . Arthritis   . Depression    denies  . Headache    migraines    Past Surgical History:  Procedure Laterality Date  . ABDOMINAL HYSTERECTOMY    . ANKLE FUSION    . BOWEL RESECTION    . KNEE ARTHROSCOPY    . KNEE SURGERY Bilateral    LEFT ARTHROCOPY  X3    ,  RT X1  . TONSILLECTOMY    . TOTAL KNEE ARTHROPLASTY Left 10/07/2015  . TOTAL KNEE ARTHROPLASTY Left 10/07/2015   Procedure: TOTAL KNEE ARTHROPLASTY;  Surgeon: Valeria Batman, MD;  Location: [facility] OR;  Service: Orthopedics;  Laterality: Left;  . TOTAL KNEE ARTHROPLASTY Right 01/03/2018  . TOTAL KNEE ARTHROPLASTY Right 01/03/2018   Procedure: RIGHT TOTAL KNEE ARTHROPLASTY;  Surgeon: Valeria Batman, MD;  Location: [facility] OR;  Service: Orthopedics;  Laterality: Right;    There were no vitals filed for this visit.   Subjective Assessment - 02/19/20 1702    Subjective Patient reporting continued pain. Has worked on [employer] daily but having most pain with ER.    Pertinent History HA, L TKA 2017, R TKA 2019, ankle fusion    Diagnostic tests 02/02/20 R shoulder MRI: Mild supraspinatus and infraspinatus tendinosis with tiny rim rent insertional tear near the interdigitating fibers.  Nofull-thickness rotator cuff tear.    Patient Stated Goals decrease pain    Currently in Pain? Yes    Pain Score 5     Pain Location Shoulder    Pain Orientation Right;Anterior    Pain Descriptors / Indicators Aching    Pain Type Chronic pain              [facility] PT Assessment - 02/19/20 0001      Observation/Other Assessments   Focus on Therapeutic Outcomes (FOTO)  Shoulder: 55 (45% limited, 36% predicted)                         [facility] Adult PT Treatment/Exercise - 02/19/20 0001      Exercises   Exercises Shoulder      Shoulder Exercises: Supine   External Rotation AAROM;Right;10 reps    External Rotation Limitations with wand; elbow elevated on towel roll at ~60 deg   unable to tolerate with shoulder at ~90 deg   Internal Rotation AAROM;Right;10 reps    Internal Rotation Limitations with wand to belly    Flexion AAROM;Right;10 reps    Flexion Limitations with wand to tolerance   cues to avoid pushing into pasin    ABduction AAROM;Right;10 reps    ABduction Limitations  with wand to tolerance      Shoulder Exercises: Sidelying   External Rotation AROM;Right;10 reps    External Rotation Limitations ROM to tolerance; cues to decrease eccentric phase    ABduction AROM;Right;10 reps    ABduction Limitations thumb up; cues to decrease eccentric phase      Shoulder Exercises: Standing   Extension Strengthening;Both;10 reps;Theraband    Theraband Level (Shoulder Extension) Level 1 (Yellow)    Extension Limitations cueing for scap squeeze    Row Strengthening;Both;15 reps;Theraband    Theraband Level (Shoulder Row) Level 2 (Red)    Row Limitations cues to avoiud over-extension and promote scap squeeze      Shoulder Exercises: Pulleys   Flexion 3 minutes    Flexion Limitations to tolerance   cues to avoid pushing into pain   Scaption 1 minute    Scaption Limitations to tolerance   cues to avoid pushing into pain     Shoulder Exercises: Isometric  Strengthening   External Rotation 5X5"    External Rotation Limitations 50% effort    Internal Rotation 5X5"    Internal Rotation Limitations 50% effort      Modalities   Modalities Vasopneumatic      Vasopneumatic   Number Minutes Vasopneumatic  10 minutes    Vasopnuematic Location  Shoulder   R   Vasopneumatic Pressure Low    Vasopneumatic Temperature  Lowest                  PT Education - 02/19/20 1743    Education Details update to HEP    Person(s) Educated Patient    Methods Explanation;Demonstration;Verbal cues;Tactile cues;Handout    Comprehension Verbalized understanding;Returned demonstration            PT Short Term Goals - 02/19/20 1747      PT SHORT TERM GOAL #1   Title Pt will be independent with initial HEP     Time 3    Period Weeks    Status On-going    Target Date 03/04/20             PT Long Term Goals - 02/19/20 1747      PT LONG TERM GOAL #1   Title Pt will be independent with advanced HEP program     Time 6    Period Weeks    Status On-going      PT LONG TERM GOAL #2   Title Patient to demonstrate R shoulder AROM WFL and without pain limiting.    Time 6    Period Weeks    Status On-going      PT LONG TERM GOAL #3   Title Patient to demonstrate R shoulder strength >=4+/5.    Time 6    Period Weeks    Status On-going      PT LONG TERM GOAL #4   Title Patient to report 0/10 pain in R shoulder after a full work day.    Time 6    Period Weeks    Status On-going      PT LONG TERM GOAL #5   Title Patient to return to light gym activities without pain limiting.     Time 6    Period Weeks    Status On-going                 Plan - 02/19/20 1745    Clinical Impression Statement Patient reporting continued R shoulder pain and particularly painful with shoulder ER. Reviewed  HEP with patient demonstrating good ROM and tolerance with shoulder AAROM in flexion and abduction, but noting pain with ER at shoulder positioned  at 90 degrees. Better tolerated reported when prompted to hold shoulder at ~60 degrees. Initiated sidelying shoulder AROM with patient demonstrating good control and tolerance. Periscapular strengthening was initiated with light banded resistance and without complaints. Patient intermittently requiring reminder for scapular retraction. Updated HEP with exercises that were well-tolerated today. Ended session with [place] to R shoulder for pain relief. Patient without complaints at end of session.    Comorbidities HA, L TKA 2017, R TKA 2019, ankle fusion    PT Treatment/Interventions ADLs/Self Care Home Management;Cryotherapy;Electrical Stimulation;Iontophoresis 4mg /ml Dexamethasone;Moist Heat;Therapeutic exercise;Therapeutic activities;Functional mobility training;Ultrasound;Neuromuscular re-education;Patient/family education;Manual techniques;Vasopneumatic Device;Taping;Energy conservation;Dry needling;Passive range of motion    PT Next Visit Plan gentle R shoulder AAROM/PROM and periscapular/RTC strengthening to tolerance    Consulted and Agree with Plan of Care Patient           Patient will benefit from skilled therapeutic intervention in order to improve the following deficits and impairments:  Increased edema, Decreased activity tolerance, Decreased strength, Increased fascial restricitons, Impaired UE functional use, Pain, Increased muscle spasms, Improper body mechanics, Decreased range of motion, Impaired flexibility, Postural dysfunction  Visit Diagnosis: Chronic right shoulder pain  Stiffness of right shoulder, not elsewhere classified  Muscle weakness (generalized)  Other symptoms and signs involving the musculoskeletal system     Problem List Patient Active Problem List   Diagnosis Date Noted  . Impingement syndrome of right shoulder 01/24/2020  . S/P total knee arthroplasty, right 01/08/2018  . Primary localized osteoarthritis of right knee 01/03/2018  . Spinal stenosis  of lumbar region 05/30/2017  . Postoperative anemia due to acute blood loss 10/11/2015  . Arthritis 10/11/2015  . Constipation 10/11/2015  . Headache 10/11/2015  . Osteoarthritis of left knee 10/07/2015  . Obesity 10/07/2015  . S/P total knee replacement using cement 10/07/2015     12/07/2015, PT, DPT 02/19/20 5:51 PM   [facility] Outpatient Rehabilitation [facility] [address]  [address] [place], [place], [place] Phone: [phone]   Fax:  [phone]  Name: KIDADA GING MRN: [id number] Setting Date of Birth: January 08, 1951

## 2020-02-26 ENCOUNTER — Other Ambulatory Visit: Payer: Self-pay

## 2020-02-26 ENCOUNTER — Ambulatory Visit: Payer: BC Managed Care – PPO

## 2020-02-26 DIAGNOSIS — R29898 Other symptoms and signs involving the musculoskeletal system: Secondary | ICD-10-CM

## 2020-02-26 DIAGNOSIS — M6281 Muscle weakness (generalized): Secondary | ICD-10-CM

## 2020-02-26 DIAGNOSIS — M25611 Stiffness of right shoulder, not elsewhere classified: Secondary | ICD-10-CM

## 2020-02-26 DIAGNOSIS — G8929 Other chronic pain: Secondary | ICD-10-CM

## 2020-02-26 DIAGNOSIS — M25511 Pain in right shoulder: Secondary | ICD-10-CM | POA: Diagnosis not present

## 2020-02-26 NOTE — Therapy (Signed)
[facility] Outpatient Rehabilitation [facility] [address]  [address] [place], [place], [address] Phone: [phone]   Fax:  [phone]  Physical Therapy Treatment  Patient Details  Name: Lauren Frye MRN: [id number] Date of Birth: 1951-03-31 Referring Provider (PT): [name], MD   Encounter Date: 02/26/2020   PT End of Session - 02/26/20 1617    Visit Number 3    Number of Visits 9    Date for PT Re-Evaluation 03/25/20    Authorization Type BCBS    PT Start Time 1610    PT Stop Time 1710    PT Time Calculation (min) 60 min    Activity Tolerance Patient tolerated treatment well    Behavior During Therapy [facility] for tasks assessed/performed           Past Medical History:  Diagnosis Date  . Arthritis   . Depression    denies  . Headache    migraines    Past Surgical History:  Procedure Laterality Date  . ABDOMINAL HYSTERECTOMY    . ANKLE FUSION    . BOWEL RESECTION    . KNEE ARTHROSCOPY    . KNEE SURGERY Bilateral    LEFT ARTHROCOPY  X3    ,  RT X1  . TONSILLECTOMY    . TOTAL KNEE ARTHROPLASTY Left 10/07/2015  . TOTAL KNEE ARTHROPLASTY Left 10/07/2015   Procedure: TOTAL KNEE ARTHROPLASTY;  Surgeon: Valeria Batman, MD;  Location: [facility] OR;  Service: Orthopedics;  Laterality: Left;  . TOTAL KNEE ARTHROPLASTY Right 01/03/2018  . TOTAL KNEE ARTHROPLASTY Right 01/03/2018   Procedure: RIGHT TOTAL KNEE ARTHROPLASTY;  Surgeon: Valeria Batman, MD;  Location: [facility] OR;  Service: Orthopedics;  Laterality: Right;    There were no vitals filed for this visit.   Subjective Assessment - 02/26/20 1630    Subjective Pt. noting she is performing HEP daily 2x.    Pertinent History HA, L TKA 2017, R TKA 2019, ankle fusion    Diagnostic tests 02/02/20 R shoulder MRI: Mild supraspinatus and infraspinatus tendinosis with tiny rim rent insertional tear near the interdigitating fibers. No full-thickness rotator cuff tear.    Patient Stated Goals decrease  pain    Currently in Pain? No/denies    Pain Score 0-No pain   for a short time up to 10/10   Pain Location Shoulder    Pain Orientation Right;Anterior    Pain Descriptors / Indicators Aching    Pain Type Chronic pain    Multiple Pain Sites No                             [facility] Adult PT Treatment/Exercise - 02/26/20 0001      Shoulder Exercises: Supine   Protraction Both;15 reps;Strengthening    External Rotation AAROM;Right;10 reps    Flexion AAROM;Right;10 reps    Flexion Limitations with wand to tolerance   5"hold    ABduction AAROM;Right;10 reps    ABduction Limitations with wand to tolerance      Shoulder Exercises: Sidelying   External Rotation AROM;Right;10 reps    ABduction AROM;Right;15 reps    ABduction Limitations thumb up; cues to decrease eccentric phase    Other Sidelying Exercises R shoulder flexion + scapular retraction x 10 reps    Other Sidelying Exercises R shoulder horizontal abduction x 10      Shoulder Exercises: Standing   Extension Strengthening;Both;10 reps;Theraband    Theraband Level (  Shoulder Extension) Level 1 (Yellow)    Row Strengthening;Both;15 reps;Theraband    Theraband Level (Shoulder Row) Level 2 (Red)      Shoulder Exercises: Pulleys   Flexion 3 minutes    Flexion Limitations to tolerance   cues to slow pace    Scaption 3 minutes    Scaption Limitations to tolerance   cues to slow pace      Shoulder Exercises: Isometric Strengthening   External Rotation --   5" x 10 reps    External Rotation Limitations 50% effort      Vasopneumatic   Number Minutes Vasopneumatic  10 minutes    Vasopnuematic Location  Shoulder    Vasopneumatic Pressure Low    Vasopneumatic Temperature  Lowest      Manual Therapy   Manual Therapy --    Manual therapy comments --                  PT Education - 02/26/20 1755    Education Details HEP update;  yellow TB row    Person(s) Educated Patient    Methods  Explanation;Demonstration;Verbal cues;Handout    Comprehension Returned demonstration;Verbal cues required;Verbalized understanding            PT Short Term Goals - 02/19/20 1747      PT SHORT TERM GOAL #1   Title Pt will be independent with initial HEP     Time 3    Period Weeks    Status On-going    Target Date 03/04/20             PT Long Term Goals - 02/19/20 1747      PT LONG TERM GOAL #1   Title Pt will be independent with advanced HEP program     Time 6    Period Weeks    Status On-going      PT LONG TERM GOAL #2   Title Patient to demonstrate R shoulder AROM WFL and without pain limiting.    Time 6    Period Weeks    Status On-going      PT LONG TERM GOAL #3   Title Patient to demonstrate R shoulder strength >=4+/5.    Time 6    Period Weeks    Status On-going      PT LONG TERM GOAL #4   Title Patient to report 0/10 pain in R shoulder after a full work day.    Time 6    Period Weeks    Status On-going      PT LONG TERM GOAL #5   Title Patient to return to light gym activities without pain limiting.     Time 6    Period Weeks    Status On-going                 Plan - 02/26/20 1622    Clinical Impression Statement Lu with complaint that she still has significant R anterior/lateral shoulder pain if she "moves the wrong way" - reaching overhead or behind back.  Tolerated progression of isometrics and with some visible improvement in R shoulder ROM with AAROM today.  Ended visit with HEP update and yellow TB issued to pt. as she tolerated all activities in session well without increased pain however did require tactile guidance at times.  Ended visit with ice/compression to R shoulder to reduce post-exercise soreness.    Comorbidities HA, L TKA 2017, R TKA 2019, ankle fusion    Rehab Potential Good  PT Treatment/Interventions ADLs/Self Care Home Management;Cryotherapy;Electrical Stimulation;Iontophoresis 4mg /ml Dexamethasone;Moist  Heat;Therapeutic exercise;Therapeutic activities;Functional mobility training;Ultrasound;Neuromuscular re-education;Patient/family education;Manual techniques;Vasopneumatic Device;Taping;Energy conservation;Dry needling;Passive range of motion    PT Next Visit Plan gentle R shoulder AAROM/PROM and periscapular/RTC strengthening to tolerance    Consulted and Agree with Plan of Care Patient           Patient will benefit from skilled therapeutic intervention in order to improve the following deficits and impairments:  Increased edema, Decreased activity tolerance, Decreased strength, Increased fascial restricitons, Impaired UE functional use, Pain, Increased muscle spasms, Improper body mechanics, Decreased range of motion, Impaired flexibility, Postural dysfunction  Visit Diagnosis: Chronic right shoulder pain  Stiffness of right shoulder, not elsewhere classified  Muscle weakness (generalized)  Other symptoms and signs involving the musculoskeletal system     Problem List Patient Active Problem List   Diagnosis Date Noted  . Impingement syndrome of right shoulder 01/24/2020  . S/P total knee arthroplasty, right 01/08/2018  . Primary localized osteoarthritis of right knee 01/03/2018  . Spinal stenosis of lumbar region 05/30/2017  . Postoperative anemia due to acute blood loss 10/11/2015  . Arthritis 10/11/2015  . Constipation 10/11/2015  . Headache 10/11/2015  . Osteoarthritis of left knee 10/07/2015  . Obesity 10/07/2015  . S/P total knee replacement using cement 10/07/2015    12/07/2015, PTA 02/26/20 6:04 PM    [facility] Outpatient Rehabilitation [facility] [address]  [address] [place], [place], [place] Phone: [phone]   Fax:  [phone]  Name: Lauren Frye MRN: [facility] Setting Date of Birth: 03/11/1951

## 2020-02-28 ENCOUNTER — Ambulatory Visit: Payer: BC Managed Care – PPO

## 2020-02-28 ENCOUNTER — Other Ambulatory Visit: Payer: Self-pay

## 2020-02-28 DIAGNOSIS — R29898 Other symptoms and signs involving the musculoskeletal system: Secondary | ICD-10-CM

## 2020-02-28 DIAGNOSIS — M6281 Muscle weakness (generalized): Secondary | ICD-10-CM

## 2020-02-28 DIAGNOSIS — M25511 Pain in right shoulder: Secondary | ICD-10-CM | POA: Diagnosis not present

## 2020-02-28 DIAGNOSIS — M25611 Stiffness of right shoulder, not elsewhere classified: Secondary | ICD-10-CM

## 2020-02-28 NOTE — Therapy (Signed)
[facility] Outpatient Rehabilitation [facility] [address]  [address] [place], [place], [address] Phone: [phone]   Fax:  [phone]  Physical Therapy Treatment  Patient Details  Name: Lauren Frye MRN: [id number] Date of Birth: 1951/04/05 Referring Provider (PT): Lauren Campbell, MD   Encounter Date: 02/28/2020   PT End of Session - 02/28/20 1708    Visit Number 4    Number of Visits 9    Date for PT Re-Evaluation 03/25/20    Authorization Type BCBS    PT Start Time 1700    PT Stop Time 1745    PT Time Calculation (min) 45 min    Activity Tolerance Patient tolerated treatment well    Behavior During Therapy [facility] for tasks assessed/performed           Past Medical History:  Diagnosis Date  . Arthritis   . Depression    denies  . Headache    migraines    Past Surgical History:  Procedure Laterality Date  . ABDOMINAL HYSTERECTOMY    . ANKLE FUSION    . BOWEL RESECTION    . KNEE ARTHROSCOPY    . KNEE SURGERY Bilateral    LEFT ARTHROCOPY  X3    ,  RT X1  . TONSILLECTOMY    . TOTAL KNEE ARTHROPLASTY Left 10/07/2015  . TOTAL KNEE ARTHROPLASTY Left 10/07/2015   Procedure: TOTAL KNEE ARTHROPLASTY;  Surgeon: Lauren Batman, MD;  Location: [facility] OR;  Service: Orthopedics;  Laterality: Left;  . TOTAL KNEE ARTHROPLASTY Right 01/03/2018  . TOTAL KNEE ARTHROPLASTY Right 01/03/2018   Procedure: RIGHT TOTAL KNEE ARTHROPLASTY;  Surgeon: Lauren Batman, MD;  Location: [facility] OR;  Service: Orthopedics;  Laterality: Right;    There were no vitals filed for this visit.   Subjective Assessment - 02/28/20 1758    Subjective Doing ok.    Pertinent History HA, L TKA 2017, R TKA 2019, ankle fusion    Diagnostic tests 02/02/20 R shoulder MRI: Mild supraspinatus and infraspinatus tendinosis with tiny rim rent insertional tear near the interdigitating fibers. No full-thickness rotator cuff tear.    Patient Stated Goals decrease pain    Currently in Pain?  No/denies    Pain Score 0-No pain    Pain Location Shoulder    Pain Orientation Right;Anterior    Pain Descriptors / Indicators Aching    Pain Type Chronic pain                             [facility] Adult PT Treatment/Exercise - 02/28/20 0001      Self-Care   Self-Care Other Self-Care Comments    Other Self-Care Comments  Self ball release on wall to infraspinatus       Shoulder Exercises: Supine   External Rotation AAROM;Right;10 reps    External Rotation Limitations Wand; 5 " hold     ABduction AAROM;Right;10 reps    ABduction Limitations with wand to tolerance      Shoulder Exercises: Pulleys   Flexion 3 minutes    Flexion Limitations to tolerance    Scaption 3 minutes    Scaption Limitations to tolerance      Modalities   Modalities Moist Heat      Moist Heat Therapy   Number Minutes Moist Heat 10 Minutes    Moist Heat Location Shoulder   B UT     Manual Therapy   Manual Therapy Soft tissue mobilization;Myofascial  release    Manual therapy comments seated and L sidelying     Soft tissue mobilization STM to R infraspinatus, teres group, R UT, R rhomboids, R triceps, R biceps     Myofascial Release TPR to R lateral biceps, R tricepts, R UT, R infraspinatus                   PT Education - 02/28/20 1803    Education Details HEP update; ball release on wall to infraspinatus, UT stretch    Person(s) Educated Patient    Methods Explanation;Demonstration;Verbal cues;Handout    Comprehension Verbalized understanding;Returned demonstration;Verbal cues required            PT Short Term Goals - 02/19/20 1747      PT SHORT TERM GOAL #1   Title Pt will be independent with initial HEP     Time 3    Period Weeks    Status On-going    Target Date 03/04/20             PT Long Term Goals - 02/19/20 1747      PT LONG TERM GOAL #1   Title Pt will be independent with advanced HEP program     Time 6    Period Weeks    Status On-going       PT LONG TERM GOAL #2   Title Patient to demonstrate R shoulder AROM WFL and without pain limiting.    Time 6    Period Weeks    Status On-going      PT LONG TERM GOAL #3   Title Patient to demonstrate R shoulder strength >=4+/5.    Time 6    Period Weeks    Status On-going      PT LONG TERM GOAL #4   Title Patient to report 0/10 pain in R shoulder after a full work day.    Time 6    Period Weeks    Status On-going      PT LONG TERM GOAL #5   Title Patient to return to light gym activities without pain limiting.     Time 6    Period Weeks    Status On-going                 Plan - 02/28/20 1741    Clinical Impression Statement [name] noting certain movements still give her a "catching" R anterior shoulder pain which takes her breath away.  Able to complete gentle ROM exercises largely pain free with cueing from therapist to avoid painful arc of motion.  MT addressed increased tension throughout R shoulder complex along with R biceps, triceps, R UT.  TPR to R UT, R infraspinatus, R biceps/triceps with good twitch response elicited.  Ended visit with trial of B upper shoulder moist heat pack to reduce muscular guarding with good relief noted.    Comorbidities HA, L TKA 2017, R TKA 2019, ankle fusion    Rehab Potential Good    PT Treatment/Interventions ADLs/Self Care Home Management;Cryotherapy;Electrical Stimulation;Iontophoresis 4mg /ml Dexamethasone;Moist Heat;Therapeutic exercise;Therapeutic activities;Functional mobility training;Ultrasound;Neuromuscular re-education;Patient/family education;Manual techniques;Vasopneumatic Device;Taping;Energy conservation;Dry needling;Passive range of motion    PT Next Visit Plan gentle R shoulder AAROM/PROM and periscapular/RTC strengthening to tolerance    Consulted and Agree with Plan of Care Patient           Patient will benefit from skilled therapeutic intervention in order to improve the following deficits and impairments:   Increased edema, Decreased activity tolerance, Decreased strength,  Increased fascial restricitons, Impaired UE functional use, Pain, Increased muscle spasms, Improper body mechanics, Decreased range of motion, Impaired flexibility, Postural dysfunction  Visit Diagnosis: Chronic right shoulder pain  Stiffness of right shoulder, not elsewhere classified  Muscle weakness (generalized)  Other symptoms and signs involving the musculoskeletal system     Problem List Patient Active Problem List   Diagnosis Date Noted  . Impingement syndrome of right shoulder 01/24/2020  . S/P total knee arthroplasty, right 01/08/2018  . Primary localized osteoarthritis of right knee 01/03/2018  . Spinal stenosis of lumbar region 05/30/2017  . Postoperative anemia due to acute blood loss 10/11/2015  . Arthritis 10/11/2015  . Constipation 10/11/2015  . Headache 10/11/2015  . Osteoarthritis of left knee 10/07/2015  . Obesity 10/07/2015  . S/P total knee replacement using cement 10/07/2015    Kermit Balo, PTA 02/28/20 6:06 PM   [facility] Outpatient Rehabilitation [facility] [address]  [address] [place], [place], [address] Phone: [phone]   Fax:  [phone]  Name: Lauren Frye MRN: [id number] Date of Birth: 30-Oct-1950

## 2020-03-03 ENCOUNTER — Ambulatory Visit: Payer: BC Managed Care – PPO | Admitting: Physical Therapy

## 2020-03-03 ENCOUNTER — Encounter: Payer: Self-pay | Admitting: Physical Therapy

## 2020-03-03 ENCOUNTER — Other Ambulatory Visit: Payer: Self-pay

## 2020-03-03 DIAGNOSIS — G8929 Other chronic pain: Secondary | ICD-10-CM

## 2020-03-03 DIAGNOSIS — M25511 Pain in right shoulder: Secondary | ICD-10-CM | POA: Diagnosis not present

## 2020-03-03 DIAGNOSIS — M6281 Muscle weakness (generalized): Secondary | ICD-10-CM

## 2020-03-03 DIAGNOSIS — R29898 Other symptoms and signs involving the musculoskeletal system: Secondary | ICD-10-CM

## 2020-03-03 DIAGNOSIS — M25611 Stiffness of right shoulder, not elsewhere classified: Secondary | ICD-10-CM

## 2020-03-03 NOTE — Therapy (Signed)
[facility] Outpatient Rehabilitation [facility] [address]  [address] [place], [place], [address] Phone: [phone]   Fax:  [phone]  Physical Therapy Treatment  Patient Details  Name: Lauren Frye MRN: [id number] Date of Birth: 1951/02/22 Referring Provider (PT): [name], MD   Encounter Date: 03/03/2020   PT End of Session - 03/03/20 1701    Visit Number 5    Number of Visits 9    Date for PT Re-Evaluation 03/25/20    Authorization Type BCBS    PT Start Time 1617    PT Stop Time 1713    PT Time Calculation (min) 56 min    Activity Tolerance Patient tolerated treatment well;Patient limited by pain    Behavior During Therapy [facility] for tasks assessed/performed           Past Medical History:  Diagnosis Date  . Arthritis   . Depression    denies  . Headache    migraines    Past Surgical History:  Procedure Laterality Date  . ABDOMINAL HYSTERECTOMY    . ANKLE FUSION    . BOWEL RESECTION    . KNEE ARTHROSCOPY    . KNEE SURGERY Bilateral    LEFT ARTHROCOPY  X3    ,  RT X1  . TONSILLECTOMY    . TOTAL KNEE ARTHROPLASTY Left 10/07/2015  . TOTAL KNEE ARTHROPLASTY Left 10/07/2015   Procedure: TOTAL KNEE ARTHROPLASTY;  Surgeon: Valeria Batman, MD;  Location: [facility] OR;  Service: Orthopedics;  Laterality: Left;  . TOTAL KNEE ARTHROPLASTY Right 01/03/2018  . TOTAL KNEE ARTHROPLASTY Right 01/03/2018   Procedure: RIGHT TOTAL KNEE ARTHROPLASTY;  Surgeon: Valeria Batman, MD;  Location: [facility] OR;  Service: Orthopedics;  Laterality: Right;    There were no vitals filed for this visit.   Subjective Assessment - 03/03/20 1618    Subjective R shoulder is hurting more today because she got "aggravated" at work.    Pertinent History HA, L TKA 2017, R TKA 2019, ankle fusion    Diagnostic tests 02/02/20 R shoulder MRI: Mild supraspinatus and infraspinatus tendinosis with tiny rim rent insertional tear near the interdigitating fibers. No full-thickness  rotator cuff tear.    Patient Stated Goals decrease pain    Currently in Pain? Yes    Pain Score 4     Pain Location Shoulder    Pain Orientation Right    Pain Descriptors / Indicators Aching    Pain Type Chronic pain                             [facility] Adult PT Treatment/Exercise - 03/03/20 0001      Shoulder Exercises: Seated   External Rotation AAROM;Right;10 reps    External Rotation Limitations shoulder ER AAROM with arm elevated on white and blue bolsters   discontinued early d/t posterior arm pain   Flexion AAROM;Right;10 reps    Flexion Limitations shoulder flexion orange pball rollouts 10x3" to tolerance   cues to perform to tolerance   Abduction AAROM;Right;10 reps    ABduction Limitations shoulder scaption orange pball rollouts 10x3" to tolerance   cues to perform to tolerance     Shoulder Exercises: Pulleys   Flexion 3 minutes    Flexion Limitations to tolerance    Scaption 3 minutes    Scaption Limitations to tolerance      Shoulder Exercises: Stretch   Other Shoulder Stretches R LS stretch 2x30"  Modalities   Modalities Electrical Stimulation;Moist Heat      Moist Heat Therapy   Number Minutes Moist Heat 15 Minutes    Moist Heat Location Cervical      Electrical Stimulation   Electrical Stimulation Location R shoulder complex    Electrical Stimulation Action IFC    Electrical Stimulation Parameters 80-150hz , output to tolerance, 15 min    Electrical Stimulation Goals Tone;Pain      Manual Therapy   Manual Therapy Soft tissue mobilization;Myofascial release    Manual therapy comments sitting    Soft tissue mobilization STM and IASTM to R UT, LS, scalenes, lateral triceps   very TTP througout, especially triceps; most tone in UT   Myofascial Release manual TPR to R triceps, UT, LS                    PT Short Term Goals - 03/03/20 1703      PT SHORT TERM GOAL #1   Title Pt will be independent with initial HEP     Time 3      Period Weeks    Status Achieved    Target Date 03/04/20             PT Long Term Goals - 02/19/20 1747      PT LONG TERM GOAL #1   Title Pt will be independent with advanced HEP program     Time 6    Period Weeks    Status On-going      PT LONG TERM GOAL #2   Title Patient to demonstrate R shoulder AROM WFL and without pain limiting.    Time 6    Period Weeks    Status On-going      PT LONG TERM GOAL #3   Title Patient to demonstrate R shoulder strength >=4+/5.    Time 6    Period Weeks    Status On-going      PT LONG TERM GOAL #4   Title Patient to report 0/10 pain in R shoulder after a full work day.    Time 6    Period Weeks    Status On-going      PT LONG TERM GOAL #5   Title Patient to return to light gym activities without pain limiting.     Time 6    Period Weeks    Status On-going                 Plan - 03/03/20 1702    Clinical Impression Statement Patient reporting increased R shoulder pain d/t getting "aggravated" on the phone at work. Did report improvement in pain from manual therapy last session. Thus, worked on addressing pain and soft tissue restriction with STM, IASTM, and TPR. Patient with considerable tone in R UT but also very TTP in LS, scalenes, and lateral triceps. Followed with gentle AAROM with cueing to advise patient to avoid pushing into pain. Patient with most discomfort with shoulder ER, thus was unable to complete a full set of these. Ended session with moist heat and e-stim for pain relief. Patient with normal integumentary response to modalities and without complaints at end of session.    Comorbidities HA, L TKA 2017, R TKA 2019, ankle fusion    Rehab Potential Good    PT Treatment/Interventions ADLs/Self Care Home Management;Cryotherapy;Electrical Stimulation;Iontophoresis 4mg /ml Dexamethasone;Moist Heat;Therapeutic exercise;Therapeutic activities;Functional mobility training;Ultrasound;Neuromuscular  re-education;Patient/family education;Manual techniques;Vasopneumatic Device;Taping;Energy conservation;Dry needling;Passive range of motion    PT Next Visit Plan  gentle R shoulder AAROM/PROM and periscapular/RTC strengthening to tolerance    Consulted and Agree with Plan of Care Patient           Patient will benefit from skilled therapeutic intervention in order to improve the following deficits and impairments:  Increased edema, Decreased activity tolerance, Decreased strength, Increased fascial restricitons, Impaired UE functional use, Pain, Increased muscle spasms, Improper body mechanics, Decreased range of motion, Impaired flexibility, Postural dysfunction  Visit Diagnosis: Chronic right shoulder pain  Stiffness of right shoulder, not elsewhere classified  Muscle weakness (generalized)  Other symptoms and signs involving the musculoskeletal system     Problem List Patient Active Problem List   Diagnosis Date Noted  . Impingement syndrome of right shoulder 01/24/2020  . S/P total knee arthroplasty, right 01/08/2018  . Primary localized osteoarthritis of right knee 01/03/2018  . Spinal stenosis of lumbar region 05/30/2017  . Postoperative anemia due to acute blood loss 10/11/2015  . Arthritis 10/11/2015  . Constipation 10/11/2015  . Headache 10/11/2015  . Osteoarthritis of left knee 10/07/2015  . Obesity 10/07/2015  . S/P total knee replacement using cement 10/07/2015     Anette Guarneri, PT, DPT 03/03/20 5:19 PM   [facility] Outpatient Rehabilitation [facility] [address]  [address] [place], [place], [address] Phone: [phone]   Fax:  [phone]  Name: Lauren Frye MRN: [id number] Date of Birth: [birth year]

## 2020-03-06 ENCOUNTER — Encounter: Payer: Self-pay | Admitting: Physical Therapy

## 2020-03-06 ENCOUNTER — Ambulatory Visit: Payer: BC Managed Care – PPO | Attending: Orthopaedic Surgery | Admitting: Physical Therapy

## 2020-03-06 ENCOUNTER — Other Ambulatory Visit: Payer: Self-pay

## 2020-03-06 DIAGNOSIS — R29898 Other symptoms and signs involving the musculoskeletal system: Secondary | ICD-10-CM | POA: Insufficient documentation

## 2020-03-06 DIAGNOSIS — M25611 Stiffness of right shoulder, not elsewhere classified: Secondary | ICD-10-CM | POA: Diagnosis present

## 2020-03-06 DIAGNOSIS — M6281 Muscle weakness (generalized): Secondary | ICD-10-CM | POA: Insufficient documentation

## 2020-03-06 DIAGNOSIS — G8929 Other chronic pain: Secondary | ICD-10-CM | POA: Diagnosis present

## 2020-03-06 DIAGNOSIS — M25511 Pain in right shoulder: Secondary | ICD-10-CM | POA: Insufficient documentation

## 2020-03-06 NOTE — Therapy (Signed)
[facility] Outpatient Rehabilitation [facility] [address]  [address] [place], [place], [address] Phone: [phone]   Fax:  [phone]  Physical Therapy Treatment  Patient Details  Name: Lauren Frye MRN: [id number] Date of Birth: [birth year] Referring Provider (PT): [name], MD   Encounter Date: 03/06/2020   PT End of Session - 03/06/20 1747    Visit Number 6    Number of Visits 9    Date for PT Re-Evaluation 03/25/20    Authorization Type BCBS    PT Start Time 1649    PT Stop Time 1751    PT Time Calculation (min) 62 min    Activity Tolerance Patient tolerated treatment well    Behavior During Therapy [facility] for tasks assessed/performed           Past Medical History:  Diagnosis Date  . Arthritis   . Depression    denies  . Headache    migraines    Past Surgical History:  Procedure Laterality Date  . ABDOMINAL HYSTERECTOMY    . ANKLE FUSION    . BOWEL RESECTION    . KNEE ARTHROSCOPY    . KNEE SURGERY Bilateral    LEFT ARTHROCOPY  X3    ,  RT X1  . TONSILLECTOMY    . TOTAL KNEE ARTHROPLASTY Left 10/07/2015  . TOTAL KNEE ARTHROPLASTY Left 10/07/2015   Procedure: TOTAL KNEE ARTHROPLASTY;  Surgeon: Valeria Batman, MD;  Location: [facility] OR;  Service: Orthopedics;  Laterality: Left;  . TOTAL KNEE ARTHROPLASTY Right 01/03/2018  . TOTAL KNEE ARTHROPLASTY Right 01/03/2018   Procedure: RIGHT TOTAL KNEE ARTHROPLASTY;  Surgeon: Valeria Batman, MD;  Location: [facility] OR;  Service: Orthopedics;  Laterality: Right;    There were no vitals filed for this visit.   Subjective Assessment - 03/06/20 1650    Subjective Is off from work for the next 4 days so she is feeling better. Pulled her shoulder while closing her computer- this lasted about 10 min.    Pertinent History HA, L TKA 2017, R TKA 2019, ankle fusion    Diagnostic tests 02/02/20 R shoulder MRI: Mild supraspinatus and infraspinatus tendinosis with tiny rim rent insertional tear near the  interdigitating fibers. No full-thickness rotator cuff tear.    Patient Stated Goals decrease pain    Currently in Pain? No/denies                             [facility] Adult PT Treatment/Exercise - 03/06/20 0001      Shoulder Exercises: Supine   Internal Rotation AROM;Right;10 reps    Internal Rotation Limitations shoulder abducted to 90 deg, motion to tolerance    Flexion Strengthening;Right;10 reps    Shoulder Flexion Weight (lbs) 1    Flexion Limitations cues to decrease eccentric speed       Shoulder Exercises: Seated   Extension AAROM;Right;10 reps    Extension Limitations AAROM with wand    External Rotation AAROM;Right;10 reps    External Rotation Limitations shoulder ER AAROM with wand   cues to maintain elbows tucked   Flexion AAROM;Right;10 reps    Flexion Limitations with wand, to tolerance   good ROM   Abduction AAROM;Right;10 reps    ABduction Limitations with wand, to tolerance    Other Seated Exercises R shoulder IR stretching with wand and strap to assess tolerance- most discomfort with strap      Shoulder Exercises: Sidelying  External Rotation Right;10 reps;Strengthening    External Rotation Weight (lbs) 1    External Rotation Limitations 2x10; ROM to tolerance; cues to slow down    ABduction Strengthening;Right;10 reps;Weights    ABduction Weight (lbs) 0,1   5x 0#, 5x 1#   ABduction Limitations thumb up; cues to decrease eccentric phase      Shoulder Exercises: Standing   External Rotation Strengthening;Right;10 reps;Theraband    Theraband Level (Shoulder External Rotation) Level 1 (Yellow)    External Rotation Limitations isometric stepouts with shoulder neutral    Internal Rotation Strengthening    Theraband Level (Shoulder Internal Rotation) Level 1 (Yellow)    Internal Rotation Limitations isometric stepouts with shoulder neutral      Shoulder Exercises: ROM/Strengthening   UBE (Upper Arm Bike) L1 x 3 min forward/1 min backwards     Cybex Row Limitations 10x 15#, 10x 20# with narrow grip   excellent form     Manual Therapy   Manual Therapy Soft tissue mobilization;Myofascial release    Manual therapy comments sitting    Soft tissue mobilization STM to R lateral triceps and biceps    Myofascial Release manual TPR to R lateral triceps and biceps   very TTP and several trigger points                 PT Education - 03/06/20 1747    Education Details update to HEP    Person(s) Educated Patient    Methods Explanation;Demonstration;Tactile cues;Verbal cues;Handout    Comprehension Verbalized understanding;Returned demonstration            PT Short Term Goals - 03/03/20 1703      PT SHORT TERM GOAL #1   Title Pt will be independent with initial HEP     Time 3    Period Weeks    Status Achieved    Target Date 03/04/20             PT Long Term Goals - 02/19/20 1747      PT LONG TERM GOAL #1   Title Pt will be independent with advanced HEP program     Time 6    Period Weeks    Status On-going      PT LONG TERM GOAL #2   Title Patient to demonstrate R shoulder AROM WFL and without pain limiting.    Time 6    Period Weeks    Status On-going      PT LONG TERM GOAL #3   Title Patient to demonstrate R shoulder strength >=4+/5.    Time 6    Period Weeks    Status On-going      PT LONG TERM GOAL #4   Title Patient to report 0/10 pain in R shoulder after a full work day.    Time 6    Period Weeks    Status On-going      PT LONG TERM GOAL #5   Title Patient to return to light gym activities without pain limiting.     Time 6    Period Weeks    Status On-going                 Plan - 03/06/20 1748    Clinical Impression Statement Patient arrived to session without new complaints. Worked on increasing resistance with sidelying and supine shoulder strengthening, which patient tolerated well. Did require cues to decrease speed, but with good ROM. Patient noting most discomfort with IR  motions, thus worked  on supine shoulder IR to tolerance. Able to demonstrate good form with IR/ER isometrics. Ended session with STM and manual TPR to R lateral triceps and biceps. Patient continues to demonstrate several tender trigger points throughout these muscles, but seeming to tolerance increased pressure with MT today. Updated HEP with exercises that were well-tolerated today. Patient reported understanding and without complaints at end of session. Patient is progressing well towards goals and demonstrating an improvement in exercise tolerance.    Comorbidities HA, L TKA 2017, R TKA 2019, ankle fusion    Rehab Potential Good    PT Treatment/Interventions ADLs/Self Care Home Management;Cryotherapy;Electrical Stimulation;Iontophoresis 4mg /ml Dexamethasone;Moist Heat;Therapeutic exercise;Therapeutic activities;Functional mobility training;Ultrasound;Neuromuscular re-education;Patient/family education;Manual techniques;Vasopneumatic Device;Taping;Energy conservation;Dry needling;Passive range of motion    PT Next Visit Plan R shoulder AROM and periscapular/RTC strengthening to tolerance    Consulted and Agree with Plan of Care Patient           Patient will benefit from skilled therapeutic intervention in order to improve the following deficits and impairments:  Increased edema, Decreased activity tolerance, Decreased strength, Increased fascial restricitons, Impaired UE functional use, Pain, Increased muscle spasms, Improper body mechanics, Decreased range of motion, Impaired flexibility, Postural dysfunction  Visit Diagnosis: Chronic right shoulder pain  Stiffness of right shoulder, not elsewhere classified  Muscle weakness (generalized)  Other symptoms and signs involving the musculoskeletal system     Problem List Patient Active Problem List   Diagnosis Date Noted  . Impingement syndrome of right shoulder 01/24/2020  . S/P total knee arthroplasty, right 01/08/2018  . Primary  localized osteoarthritis of right knee 01/03/2018  . Spinal stenosis of lumbar region 05/30/2017  . Postoperative anemia due to acute blood loss 10/11/2015  . Arthritis 10/11/2015  . Constipation 10/11/2015  . Headache 10/11/2015  . Osteoarthritis of left knee 10/07/2015  . Obesity 10/07/2015  . S/P total knee replacement using cement 10/07/2015      12/07/2015, PT, DPT 03/06/20 5:50 PM   [facility] Outpatient Rehabilitation [facility] [address]  [address] [place], [place], [place] Phone: [phone]   Fax:  [phone]  Name: DAYLYN CHRISTINE MRN: [facility] Date of Birth: Mar 18, 1951

## 2020-03-12 ENCOUNTER — Other Ambulatory Visit: Payer: Self-pay

## 2020-03-12 ENCOUNTER — Encounter: Payer: Self-pay | Admitting: Physical Therapy

## 2020-03-12 ENCOUNTER — Ambulatory Visit: Payer: BC Managed Care – PPO | Admitting: Physical Therapy

## 2020-03-12 DIAGNOSIS — M25611 Stiffness of right shoulder, not elsewhere classified: Secondary | ICD-10-CM

## 2020-03-12 DIAGNOSIS — R29898 Other symptoms and signs involving the musculoskeletal system: Secondary | ICD-10-CM

## 2020-03-12 DIAGNOSIS — M25511 Pain in right shoulder: Secondary | ICD-10-CM

## 2020-03-12 DIAGNOSIS — M6281 Muscle weakness (generalized): Secondary | ICD-10-CM

## 2020-03-12 NOTE — Therapy (Addendum)
[facility] [address]  [place], [place], [address] Phone: [phone]   Fax:  [phone]  Physical Therapy Treatment  Patient Details  Name: Lauren Frye MRN: [id number] Date of Birth: 1950-07-27 Referring Provider (PT): Joni Fears, MD   Encounter Date: 03/12/2020   PT End of Session - 03/12/20 1656    Visit Number 7    Number of Visits 13    Date for PT Re-Evaluation 04/23/20    Authorization Type [employer]    PT Start Time 1615    PT Stop Time 1704    PT Time Calculation (min) 49 min    Activity Tolerance Patient tolerated treatment well;Patient limited by pain    Behavior During Therapy [facility] for tasks assessed/performed           Past Medical History:  Diagnosis Date  . Arthritis   . Depression    denies  . Headache    migraines    Past Surgical History:  Procedure Laterality Date  . ABDOMINAL HYSTERECTOMY    . ANKLE FUSION    . BOWEL RESECTION    . KNEE ARTHROSCOPY    . KNEE SURGERY Bilateral    LEFT ARTHROCOPY  X3    ,  RT X1  . TONSILLECTOMY    . TOTAL KNEE ARTHROPLASTY Left 10/07/2015  . TOTAL KNEE ARTHROPLASTY Left 10/07/2015   Procedure: TOTAL KNEE ARTHROPLASTY;  Surgeon: Garald Balding, MD;  Location: [facility];  Service: Orthopedics;  Laterality: Left;  . TOTAL KNEE ARTHROPLASTY Right 01/03/2018  . TOTAL KNEE ARTHROPLASTY Right 01/03/2018   Procedure: RIGHT TOTAL KNEE ARTHROPLASTY;  Surgeon: Garald Balding, MD;  Location: [place];  Service: Orthopedics;  Laterality: Right;    There were no vitals filed for this visit.   Subjective Assessment - 03/12/20 1617    Subjective Notes "the more I exercise the more it hurts." Noting pain down the forearm all weekend. "May not have helped that I did some raking." Feels like she has not made progress since starting therapy as there are still movements that give her intense pain- once when reaching for the light chain and brushing her hair.     Pertinent History HA, L TKA 2017, R TKA 2019, ankle fusion    Diagnostic tests 02/02/20 R shoulder MRI: Mild supraspinatus and infraspinatus tendinosis with tiny rim rent insertional tear near the interdigitating fibers. No full-thickness rotator cuff tear.    Patient Stated Goals decrease pain    Currently in Pain? Yes    Pain Score 4     Pain Location Shoulder    Pain Orientation Right    Pain Descriptors / Indicators Aching    Pain Type Chronic pain              [facility] PT Assessment - 03/12/20 0001      Assessment   Medical Diagnosis Chronic R shoulder pain    Referring Provider (PT) Joni Fears, MD    Onset Date/Surgical Date 10/13/19      AROM   Right Shoulder Flexion 135 Degrees    Right Shoulder ABduction 115 Degrees    Right Shoulder Internal Rotation --   FIR T10; pain   Right Shoulder External Rotation --   FER C4     Strength   Right Shoulder Flexion 4/5    Right Shoulder ABduction 4/5    Right Shoulder Internal Rotation 3+/5   pain   Right Shoulder External Rotation  4-/5                         [facility] Adult PT Treatment/Exercise - 03/12/20 0001      Shoulder Exercises: Seated   External Rotation AAROM;Right;10 reps    External Rotation Limitations 10x5" to tolerance; shoulder ER AAROM with forearm elevated on white and blue bolsters      Shoulder Exercises: Standing   External Rotation Strengthening;Right;10 reps;Theraband;15 reps;12 reps    Theraband Level (Shoulder External Rotation) Level 1 (Yellow)    External Rotation Limitations towel roll under elbow; stopping at neutral   cues for scapular depression and retraction   Internal Rotation 15 reps    Theraband Level (Shoulder Internal Rotation) Level 1 (Yellow)    Internal Rotation Limitations towel roll under elbow; stopping at neutral      Shoulder Exercises: ROM/Strengthening   UBE (Upper Arm Bike) L1 x 3 min forward/1 min backwards      Shoulder Exercises: Psychiatrist 30 seconds;2 reps    Corner Stretch Limitations R low pec stretch    Other Shoulder Stretches R shoulder ER stretch in doorway 10"   good tolerance     Vasopneumatic   Number Minutes Vasopneumatic  10 minutes    Vasopnuematic Location  Shoulder   R   Vasopneumatic Pressure Low    Vasopneumatic Temperature  Lowest                  PT Education - 03/12/20 1656    Education Details discussion on objective progress and remaining impairments; update to HEP    Person(s) Educated Patient    Methods Explanation;Demonstration;Tactile cues;Verbal cues;Handout    Comprehension Verbalized understanding;Returned demonstration            PT Short Term Goals - 03/12/20 1623      PT SHORT TERM GOAL #1   Title Pt will be independent with initial HEP     Time 3    Period Weeks    Status Achieved    Target Date 03/04/20             PT Long Term Goals - 03/12/20 1623      PT LONG TERM GOAL #1   Title Pt will be independent with advanced HEP program     Time 6    Period Weeks    Status Partially Met   met for current   Target Date 04/23/20      PT LONG TERM GOAL #2   Title Patient to demonstrate R shoulder AROM WFL and without pain limiting.    Time 6    Period Weeks    Status Partially Met   improved in flexion, abduction   Target Date 04/23/20      PT LONG TERM GOAL #3   Title Patient to demonstrate R shoulder strength >=4+/5.    Time 6    Period Weeks    Status On-going   improved in flexion, abduction, ER   Target Date 04/23/20      PT LONG TERM GOAL #4   Title Patient to report 0/10 pain in R shoulder after a full work day.    Time 6    Period Weeks    Status On-going   reports 6/10 pain   Target Date 04/23/20      PT LONG TERM GOAL #5   Title Patient to return to light gym activities without pain limiting.  Time 6    Period Weeks    Status On-going   has not yet returned to gym activities   Target Date 04/23/20                 Plan  - 03/12/20 1657    Clinical Impression Statement Patient reporting increased pain over the weekend. Attributes this to her exercises and raking, however denies pain while performing her HEP. Patient reports that she has not made progress since starting therapy as she still has bouts of intense pain with certain movements- most recently reaching up to turn off the light and brushing her hair. Denies questions on HEP. Patient has demonstrated improvement in R shoulder flexion, abduction, and ER strength. AROM has improved in flexion and abduction. Reports up to 6/10 pain after a full day of work and notes that she has not been able to try going to the gym. Worked on improving ROM into ER with AAROM- advised patient to avoid pushing into pain as this motion is not generally as well-tolerated for her. Increased challenge with shoulder IR and ER strengthening with patient tolerating this well d/t shoulder positioning in neutral. Ended session with [place] to R shoulder for post-exercise soreness. Patient reported understanding of HEP update and without complaints at end of session. Patient is demonstrating slow but steady progress. Would benefit from continued skilled PT services 1x/week for 6 weeks to address remaining goals.    Comorbidities HA, L TKA 2017, R TKA 2019, ankle fusion    Rehab Potential Good    PT Frequency 1x / week    PT Duration 6 weeks    PT Treatment/Interventions ADLs/Self Care Home Management;Cryotherapy;Electrical Stimulation;Iontophoresis 36m/ml Dexamethasone;Moist Heat;Therapeutic exercise;Therapeutic activities;Functional mobility training;Ultrasound;Neuromuscular re-education;Patient/family education;Manual techniques;Vasopneumatic Device;Taping;Energy conservation;Dry needling;Passive range of motion    PT Next Visit Plan R shoulder AROM and periscapular/RTC strengthening to tolerance    Consulted and Agree with Plan of Care Patient           Patient will benefit from skilled  therapeutic intervention in order to improve the following deficits and impairments:  Increased edema, Decreased activity tolerance, Decreased strength, Increased fascial restricitons, Impaired UE functional use, Pain, Increased muscle spasms, Improper body mechanics, Decreased range of motion, Impaired flexibility, Postural dysfunction  Visit Diagnosis: Chronic right shoulder pain  Stiffness of right shoulder, not elsewhere classified  Muscle weakness (generalized)  Other symptoms and signs involving the musculoskeletal system     Problem List Patient Active Problem List   Diagnosis Date Noted  . Impingement syndrome of right shoulder 01/24/2020  . S/P total knee arthroplasty, right 01/08/2018  . Primary localized osteoarthritis of right knee 01/03/2018  . Spinal stenosis of lumbar region 05/30/2017  . Postoperative anemia due to acute blood loss 10/11/2015  . Arthritis 10/11/2015  . Constipation 10/11/2015  . Headache 10/11/2015  . Osteoarthritis of left knee 10/07/2015  . Obesity 10/07/2015  . S/P total knee replacement using cement 10/07/2015      YJanene Harvey PT, DPT 03/12/20 6:04 PM   [facility] [address] [place] [id number]: [phone]  Fax:  [phone] Name: Lauren CARRAWAYMRN: [id number] of Birth: 111-Mar-1952  PHYSICAL THERAPY DISCHARGE SUMMARY  Visits from Start of Care: 7  Current functional level related to goals / functional outcomes: See above clinical impression; patient did not return d/t undergoing shoulder manipulation    Remaining deficits: Decreased shoulder ROM &  strength; pain   Education / Equipment: HEP  Plan: Patient agrees to discharge.  Patient goals were partially met. Patient is being discharged due to the physician's request.  ?????     Janene Harvey, PT, DPT 03/31/20 9:15 AM

## 2020-03-13 ENCOUNTER — Ambulatory Visit: Payer: BC Managed Care – PPO | Admitting: Orthopaedic Surgery

## 2020-03-13 ENCOUNTER — Encounter: Payer: Self-pay | Admitting: Orthopaedic Surgery

## 2020-03-13 VITALS — Ht 66.0 in | Wt 225.0 lb

## 2020-03-13 DIAGNOSIS — M7541 Impingement syndrome of right shoulder: Secondary | ICD-10-CM | POA: Diagnosis not present

## 2020-03-13 DIAGNOSIS — M7501 Adhesive capsulitis of right shoulder: Secondary | ICD-10-CM | POA: Diagnosis not present

## 2020-03-13 NOTE — Progress Notes (Signed)
Office Visit Note   Patient: Lauren Frye           Date of Birth: 28-Sep-1950           MRN: [id number] Visit Date: 03/13/2020              Requested by: Kerin Salen, PA-C [address] [place],  [place] [address] PCP: Kerin Salen, PA-C   Assessment & Plan: Visit Diagnoses:  1. Impingement syndrome of right shoulder   2. Adhesive capsulitis of right shoulder     Plan: Naijah has had a chronic problem with her right shoulder to the point where she is having compromise of her activities.  She had an MRI scan demonstrating rotator cuff tendinitis, mild subacromial bursitis and mild degenerative arthritis at the [facility] joint.  She also has evidence of limited range of motion consistent with adhesive capsulitis.  She has had cortisone and a course of physical therapy and still having discomfort.  After much discussion I believe the best approach would be a closed manipulation of the right shoulder with follow-up therapy and monitor her response.  Secondarily she might be a candidate for arthroscopic debridement.  My concern is that if I were to do both at the same time that she will have recurrent adhesive capsulitis.  I believe the loss of motion is the major problem.  She is aware of all the above and will proceed with a closed manipulation  Follow-Up Instructions: Return We will schedule right shoulder manipulation.   Orders:  No orders of the defined types were placed in this encounter.  No orders of the defined types were placed in this encounter.     Procedures: No procedures performed   Clinical Data: No additional findings.   Subjective: Chief Complaint  Patient presents with  . Right Shoulder - Pain, Follow-up  Patient presents today for a one month follow up on her right shoulder. She has went to outpatient physical therapy and tried Voltaren gel, but did not get any relief. She only takes Aleve or Ibuprofen if necessary. She is right hand  dominant.   HPI  Review of Systems   Objective: Vital Signs: Ht 5\' 6"  (1.676 m)   Wt 225 lb (102.1 kg)   BMI 36.32 kg/m   Physical Exam Constitutional:      Appearance: She is well-developed.  Eyes:     Pupils: Pupils are equal, round, and reactive to light.  Pulmonary:     Effort: Pulmonary effort is normal.  Skin:    General: Skin is warm and dry.  Neurological:     Mental Status: She is alert and oriented to person, place, and time.  Psychiatric:        Behavior: Behavior normal.     Ortho Exam awake alert and oriented x3.  Comfortable sitting.  Lacks about 30 degrees to full overhead flexion right shoulder.  At that point she is quite uncomfortable.  I can abduct about 80 degrees at which point she has impingement.  She cannot touch the middle of her back with her right hand but can with her left.  There is no grating or crepitation.  Very minimal tenderness over the [facility] joint and the anterior subacromial region.  Good strength. Specialty Comments:  No specialty comments available.  Imaging: No results found.   PMFS History: Patient Active Problem List   Diagnosis Date Noted  . Adhesive capsulitis of right shoulder 03/13/2020  . Impingement syndrome of  right shoulder 01/24/2020  . S/P total knee arthroplasty, right 01/08/2018  . Primary localized osteoarthritis of right knee 01/03/2018  . Spinal stenosis of lumbar region 05/30/2017  . Postoperative anemia due to acute blood loss 10/11/2015  . Arthritis 10/11/2015  . Constipation 10/11/2015  . Headache 10/11/2015  . Osteoarthritis of left knee 10/07/2015  . Obesity 10/07/2015  . 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 Arthritis   . Depression    denies  . Headache    migraines    Family History  Problem Relation Age of Onset  . Parkinson's disease Mother   . COPD Mother   . Diabetes Sister     Past Surgical History:  Procedure Laterality Date    . ABDOMINAL HYSTERECTOMY    . ANKLE FUSION    . BOWEL RESECTION    . KNEE ARTHROSCOPY    . KNEE SURGERY Bilateral    LEFT ARTHROCOPY  X3    ,  RT X1  . TONSILLECTOMY    . TOTAL KNEE ARTHROPLASTY Left 10/07/2015  . TOTAL KNEE ARTHROPLASTY Left 10/07/2015   Procedure: TOTAL KNEE ARTHROPLASTY;  Surgeon: Valeria Batman, MD;  Location: [facility];  Service: Orthopedics;  Laterality: Left;  . TOTAL KNEE ARTHROPLASTY Right 01/03/2018  . TOTAL KNEE ARTHROPLASTY Right 01/03/2018   Procedure: RIGHT TOTAL KNEE ARTHROPLASTY;  Surgeon: Valeria Batman, MD;  Location: [facility] OR;  Service: Orthopedics;  Laterality: Right;   Social History   Occupational History  . Not on file  Tobacco Use  . Smoking status: Never Smoker  . Smokeless tobacco: Never Used  Vaping Use  . Vaping Use: Never used  Substance and Sexual Activity  . Alcohol use: Yes    Alcohol/week: 1.0 standard drink    Types: 1 Glasses of wine per week    Comment: ONCE A week  . Drug use: No  . Sexual activity: Not Currently    Birth control/protection: Abstinence

## 2020-03-14 ENCOUNTER — Telehealth: Payer: Self-pay | Admitting: Orthopaedic Surgery

## 2020-03-14 NOTE — Telephone Encounter (Signed)
Week after manip

## 2020-03-14 NOTE — Telephone Encounter (Signed)
Patient has surgery scheduled for right shoulder manipulation under anesthesia at [facility] on 04-10-20 @7 :30am.  Patient has questions regarding physical therapy.  Patient has post op visit 1 week after surgery.   Will patient need to schedule the PT after the post op visit or the following day of the manipulation.  Please advise patient so she will be able to make appointments  in advance.     Pt's cb  [phone] 

## 2020-03-14 NOTE — Telephone Encounter (Signed)
Please advise 

## 2020-03-17 ENCOUNTER — Other Ambulatory Visit: Payer: Self-pay

## 2020-03-17 DIAGNOSIS — M7501 Adhesive capsulitis of right shoulder: Secondary | ICD-10-CM

## 2020-03-17 NOTE — Telephone Encounter (Signed)
Spoke with patient. Order has been entered for PT. Request that it start on 04/17/2020.

## 2020-03-19 ENCOUNTER — Ambulatory Visit: Payer: BC Managed Care – PPO

## 2020-03-25 ENCOUNTER — Encounter: Payer: BC Managed Care – PPO | Admitting: Physical Therapy

## 2020-03-27 ENCOUNTER — Encounter: Payer: BC Managed Care – PPO | Admitting: Physical Therapy

## 2020-04-08 ENCOUNTER — Other Ambulatory Visit: Payer: Self-pay

## 2020-04-08 DIAGNOSIS — M7501 Adhesive capsulitis of right shoulder: Secondary | ICD-10-CM

## 2020-04-09 ENCOUNTER — Telehealth: Payer: Self-pay | Admitting: Orthopaedic Surgery

## 2020-04-09 ENCOUNTER — Other Ambulatory Visit: Payer: Self-pay | Admitting: Orthopaedic Surgery

## 2020-04-09 MED ORDER — HYDROCODONE-ACETAMINOPHEN 5-325 MG PO TABS: 1.0000 | ORAL_TABLET | ORAL | 0 refills | Status: DC | PRN

## 2020-04-09 NOTE — Telephone Encounter (Signed)
Will plan on providing pain meds post op. Please call Ms Benefiel and ask which med would she prefer. I can send it in today if more convenient

## 2020-04-09 NOTE — Telephone Encounter (Signed)
She does not care which pain medicine. Please send in this afternoon to the [facility] on her chart.

## 2020-04-09 NOTE — Telephone Encounter (Signed)
Sent in hydrocodone

## 2020-04-09 NOTE — Telephone Encounter (Signed)
Patient is scheduled for right shoulder manipulation tomorrow 7:30am at [facility] with Dr. Cleophas Dunker.  She is asking -what about medication??? (postoperatively).  Please advise TODAY. [phone]   [facility]'s [facility].

## 2020-04-09 NOTE — Telephone Encounter (Signed)
Please advise what you want me to tell patient.

## 2020-04-10 ENCOUNTER — Encounter: Payer: Self-pay | Admitting: Orthopaedic Surgery

## 2020-04-10 DIAGNOSIS — M7501 Adhesive capsulitis of right shoulder: Secondary | ICD-10-CM

## 2020-04-14 ENCOUNTER — Encounter: Payer: Self-pay | Admitting: Physical Therapy

## 2020-04-14 ENCOUNTER — Other Ambulatory Visit: Payer: Self-pay

## 2020-04-14 ENCOUNTER — Ambulatory Visit: Payer: BC Managed Care – PPO | Attending: Orthopaedic Surgery | Admitting: Physical Therapy

## 2020-04-14 DIAGNOSIS — M6281 Muscle weakness (generalized): Secondary | ICD-10-CM | POA: Diagnosis present

## 2020-04-14 DIAGNOSIS — R29898 Other symptoms and signs involving the musculoskeletal system: Secondary | ICD-10-CM

## 2020-04-14 DIAGNOSIS — M25511 Pain in right shoulder: Secondary | ICD-10-CM | POA: Diagnosis present

## 2020-04-14 DIAGNOSIS — M25611 Stiffness of right shoulder, not elsewhere classified: Secondary | ICD-10-CM | POA: Diagnosis present

## 2020-04-14 NOTE — Therapy (Signed)
[facility] [address]  [address] [place], [place], [address] Phone: [phone]   Fax:  [phone]  Physical Therapy Evaluation  Patient Details  Name: Lauren Frye MRN: [id number] Date of Birth: 09-07-1950 Referring Provider (PT): [name], MD   Encounter Date: 04/14/2020   PT End of Session - 04/14/20 1342    Visit Number 1    Number of Visits 14    Date for PT Re-Evaluation 05/19/20    Authorization Type BCBS    PT Start Time 1303    PT Stop Time 1341    PT Time Calculation (min) 38 min    Activity Tolerance Patient tolerated treatment well;Patient limited by pain    Behavior During Therapy [facility] for tasks assessed/performed           Past Medical History:  Diagnosis Date  . Arthritis   . Depression    denies  . Headache    migraines    Past Surgical History:  Procedure Laterality Date  . ABDOMINAL HYSTERECTOMY    . ANKLE FUSION    . BOWEL RESECTION    . KNEE ARTHROSCOPY    . KNEE SURGERY Bilateral    LEFT ARTHROCOPY  X3    ,  RT X1  . TONSILLECTOMY    . TOTAL KNEE ARTHROPLASTY Left 10/07/2015  . TOTAL KNEE ARTHROPLASTY Left 10/07/2015   Procedure: TOTAL KNEE ARTHROPLASTY;  Surgeon: Valeria Batman, MD;  Location: [facility] OR;  Service: Orthopedics;  Laterality: Left;  . TOTAL KNEE ARTHROPLASTY Right 01/03/2018  . TOTAL KNEE ARTHROPLASTY Right 01/03/2018   Procedure: RIGHT TOTAL KNEE ARTHROPLASTY;  Surgeon: Valeria Batman, MD;  Location: [facility] OR;  Service: Orthopedics;  Laterality: Right;    There were no vitals filed for this visit.    Subjective Assessment - 04/14/20 1304    Subjective Patient reports undergoing a R shoulder manipulation under anesthesia on 04/10/20. Was advised to lift her arm up once in a while to keep it moving. Now, noting improvement in pain levels in the shoulder, but still having some pain over the lateral deltoid. Tightness over the lateral deltoid occurs when reaching  overhead and behind her but much better. Not sure if she is supposed to ice or not.    Pertinent History HA, L TKA 2017, R TKA 2019, ankle fusion    Diagnostic tests 02/02/20 R shoulder MRI: Mild supraspinatus and infraspinatus tendinosis with tiny rim rent insertional tear near the interdigitating fibers. No full-thickness rotator cuff tear.    Patient Stated Goals decrease pain    Currently in Pain? Yes    Pain Score 3     Pain Location Shoulder    Pain Orientation Right;Lateral    Pain Descriptors / Indicators Sore    Pain Type Acute pain              [facility] PT Assessment - 04/14/20 1307      Assessment   Medical Diagnosis Adhesive capsulitis of R shoulder    Referring Provider (PT) [name], MD    Onset Date/Surgical Date 04/10/20    Hand Dominance Right    Next MD Visit 04/17/20    Prior Therapy yes- for shoulder      Precautions   Precautions None      Balance Screen   Has the patient fallen in the past 6 months No    Has the patient had a decrease in activity level because of a  fear of falling?  No    Is the patient reluctant to leave their home because of a fear of falling?  No      Home Environment   Living Environment Private residence    Living Arrangements Alone    Available Help at Discharge Family    Type of Home House      Prior Function   Level of Independence Independent    Vocation Full time employment    Leisure gym, knitting, crafts      Cognition   Overall Cognitive Status Within Functional Limits for tasks assessed      Sensation   Light Touch Appears Intact      Coordination   Gross Motor Movements are Fluid and Coordinated Yes      Posture/Postural Control   Posture/Postural Control Postural limitations    Postural Limitations Rounded Shoulders      ROM / Strength   AROM / PROM / Strength AROM;PROM;Strength      AROM   Right Shoulder Flexion 146 Degrees    Right Shoulder ABduction 109 Degrees   tight"   Right Shoulder  Internal Rotation --   FIT T10   Right Shoulder External Rotation --   FER T2   Left Shoulder Flexion 145 Degrees    Left Shoulder ABduction 141 Degrees    Left Shoulder Internal Rotation --   FIR T7   Left Shoulder External Rotation --   FER T2     PROM   PROM Assessment Site Shoulder    Right/Left Shoulder Right    Right Shoulder Flexion 156 Degrees    Right Shoulder ABduction 160 Degrees    Right Shoulder Internal Rotation 90 Degrees    Right Shoulder External Rotation 71 Degrees      Strength   Right Shoulder Flexion 4/5    Right Shoulder ABduction 4/5    Right Shoulder Internal Rotation 4-/5    Right Shoulder External Rotation 4-/5    Left Shoulder Flexion 4+/5    Left Shoulder ABduction 4+/5    Left Shoulder Internal Rotation 4/5    Left Shoulder External Rotation 4/5      Palpation   Palpation comment TTP over R lateral deltoid with palpable soft tissue restriction in the deltoid and UT                      Objective measurements completed on examination: See above findings.       [facility] Adult PT Treatment/Exercise - 04/14/20 1307      Shoulder Exercises: Pulleys   Flexion 3 minutes   cues to decrease speed to encourage stretch   Flexion Limitations to tolerance    Scaption 3 minutes    Scaption Limitations to tolerance    ABduction 3 minutes    ABduction Limitations to tolerance                  PT Education - 04/14/20 1334    Education Details prognosis, POC, HEP, edu on use of self-STM using ball on wall for lateral deltoid    Person(s) Educated Patient    Methods Explanation;Demonstration;Tactile cues;Verbal cues;Handout    Comprehension Verbalized understanding;Returned demonstration            PT Short Term Goals - 04/14/20 1348      PT SHORT TERM GOAL #1   Title Pt will be independent with initial HEP     Time 2    Period Weeks  Status New    Target Date 04/28/20             PT Long Term Goals - 04/14/20 1348        PT LONG TERM GOAL #1   Title Pt will be independent with advanced HEP program     Time 5    Period Weeks    Status New    Target Date 05/19/20      PT LONG TERM GOAL #2   Title Patient to demonstrate R shoulder AROM WFL and without pain limiting.    Time 5    Period Weeks    Status New    Target Date 05/19/20      PT LONG TERM GOAL #3   Title Patient to demonstrate R shoulder strength >=4+/5.    Time 5    Period Weeks    Status New    Target Date 05/19/20      PT LONG TERM GOAL #4   Title Patient to report 0/10 pain in R shoulder after a full work day.    Time 5    Period Weeks    Status New    Target Date 05/19/20      PT LONG TERM GOAL #5   Title Patient to return to light gym activities without pain limiting.     Time 5    Period Weeks    Status New    Target Date 05/19/20                  Plan - 04/14/20 1343    Clinical Impression Statement Patient is a 68y/o F presenting to OPPT with c/o R shoulder pain s/p R shoulder manipulation under anesthesia on 04/10/20. Reporting improvement in pain levels, now only with pain over the lateral deltoid with overhead reaching and reaching behind the back. Patient today presenting with rounded posture, limited R shoulder AROM and PROM, R shoulder weakness, and TTP over R lateral deltoid. Patient was educated on use of pulley, self-STM, and consistent performance of HEP- patient reported understanding. Would benefit from skilled PT services 3x/week for 1 week followed by 2x/week for 4 weeks.    Personal Factors and Comorbidities Age;Comorbidity 3+;Past/Current Experience;Profession;Time since onset of injury/illness/exacerbation    Comorbidities HA, L TKA 2017, R TKA 2019, ankle fusion    Examination-Activity Limitations Carry;Dressing;Hygiene/Grooming;Lift;Reach Overhead    Examination-Participation Restrictions Cleaning;Community Activity;Shop;Driving;Yard Work;Laundry;Meal Prep;Occupation    Stability/Clinical  Decision Making Stable/Uncomplicated    Clinical Decision Making Low    Rehab Potential Good    PT Frequency Other (comment)   3x/week for 1 week, 2x/week for 4 weeks   PT Duration Other (comment)    PT Treatment/Interventions ADLs/Self Care Home Management;Cryotherapy;Electrical Stimulation;Iontophoresis 4mg /ml Dexamethasone;Moist Heat;Therapeutic exercise;Therapeutic activities;Functional mobility training;Ultrasound;Neuromuscular re-education;Patient/family education;Manual techniques;Vasopneumatic Device;Taping;Energy conservation;Dry needling;Passive range of motion    PT Next Visit Plan shoulder FOTO; reassess HEP, shoulder PROM/AAROM/AROM    Consulted and Agree with Plan of Care Patient           Patient will benefit from skilled therapeutic intervention in order to improve the following deficits and impairments:  Increased edema, Decreased activity tolerance, Decreased strength, Increased fascial restricitons, Impaired UE functional use, Pain, Increased muscle spasms, Improper body mechanics, Decreased range of motion, Impaired flexibility, Postural dysfunction, Decreased scar mobility  Visit Diagnosis: Acute pain of right shoulder  Stiffness of right shoulder, not elsewhere classified  Muscle weakness (generalized)  Other symptoms and signs involving the musculoskeletal system  Problem List Patient Active Problem List   Diagnosis Date Noted  . Adhesive capsulitis of right shoulder 03/13/2020  . Impingement syndrome of right shoulder 01/24/2020  . S/P total knee arthroplasty, right 01/08/2018  . Primary localized osteoarthritis of right knee 01/03/2018  . Spinal stenosis of lumbar region 05/30/2017  . Postoperative anemia due to acute blood loss 10/11/2015  . Arthritis 10/11/2015  . Constipation 10/11/2015  . Headache 10/11/2015  . Osteoarthritis of left knee 10/07/2015  . Obesity 10/07/2015  . S/P total knee replacement using cement 10/07/2015     Anette Guarneri, PT, DPT 04/14/20 1:51 PM   [facility] [address]  [address] [place], [place], [address] Phone: [phone]   Fax:  [phone]  Name: Lauren Frye MRN: [id number] Date of Birth: September 27, 1950

## 2020-04-17 ENCOUNTER — Ambulatory Visit (INDEPENDENT_AMBULATORY_CARE_PROVIDER_SITE_OTHER): Payer: BC Managed Care – PPO | Admitting: Orthopaedic Surgery

## 2020-04-17 ENCOUNTER — Other Ambulatory Visit: Payer: Self-pay

## 2020-04-17 ENCOUNTER — Encounter: Payer: Self-pay | Admitting: Orthopaedic Surgery

## 2020-04-17 VITALS — Ht 66.0 in | Wt 225.0 lb

## 2020-04-17 DIAGNOSIS — M7501 Adhesive capsulitis of right shoulder: Secondary | ICD-10-CM

## 2020-04-17 NOTE — Progress Notes (Signed)
Office Visit Note   Patient: Lauren Frye           Date of Birth: 1950/11/23           MRN: [id number] Visit Date: 04/17/2020              Requested by: Kerin Salen, PA-C [address] [place],  [place] [address] PCP: Kerin Salen, PA-C   Assessment & Plan: Visit Diagnoses:  1. Adhesive capsulitis of right shoulder     Plan: 1 week status post manipulation of right shoulder for adhesive capsulitis and doing quite well.  Has been followed in physical therapy and has regained full overhead motion.  Not having any pain and did not have any post manipulation.  Very happy with her results.  She will finish her course of therapy and continue with a home exercise program.  Unless she has any further problems I will just plan to see her back as needed  Follow-Up Instructions: Return if symptoms worsen or fail to improve.   Orders:  No orders of the defined types were placed in this encounter.  No orders of the defined types were placed in this encounter.     Procedures: No procedures performed   Clinical Data: No additional findings.   Subjective: Chief Complaint  Patient presents with  . Left Shoulder - Follow-up    Left shoulder manipulation 04/10/2020  Patient presents today for her left shoulder. She had a left shoulder manipulation on 04/10/2020. She is now 1 week out from surgery. Patient states that she is doing okay. She is scheduled to do physical therapy three times a week.  HPI  Review of Systems   Objective: Vital Signs: Ht 5\' 6"  (1.676 m)   Wt 225 lb (102.1 kg)   BMI 36.32 kg/m   Physical Exam  Ortho Exam: With full quick overhead motion.  Negative impingement and empty can testing.  Good strength.  No popping or clicking.  Quick abduction at 90 degrees with good strength.  Specialty Comments:  No specialty comments available.  Imaging: No results found.   PMFS History: Patient Active Problem List   Diagnosis Date  Noted  . Adhesive capsulitis of right shoulder 03/13/2020  . Impingement syndrome of right shoulder 01/24/2020  . S/P total knee arthroplasty, right 01/08/2018  . Primary localized osteoarthritis of right knee 01/03/2018  . Spinal stenosis of lumbar region 05/30/2017  . Postoperative anemia due to acute blood loss 10/11/2015  . Arthritis 10/11/2015  . Constipation 10/11/2015  . Headache 10/11/2015  . Osteoarthritis of left knee 10/07/2015  . Obesity 10/07/2015  . 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 Arthritis   . Depression    denies  . Headache    migraines    Family History  Problem Relation Age of Onset  . Parkinson's disease Mother   . COPD Mother   . Diabetes Sister     Past Surgical History:  Procedure Laterality Date  . ABDOMINAL HYSTERECTOMY    . ANKLE FUSION    . BOWEL RESECTION    . KNEE ARTHROSCOPY    . KNEE SURGERY Bilateral    LEFT ARTHROCOPY  X3    ,  RT X1  . TONSILLECTOMY    . TOTAL KNEE ARTHROPLASTY Left 10/07/2015  . TOTAL KNEE ARTHROPLASTY Left 10/07/2015   Procedure: TOTAL KNEE ARTHROPLASTY;  Surgeon: 12/07/2015, MD;  Location: [facility] OR;  Service: Orthopedics;  Laterality: Left;  . TOTAL KNEE ARTHROPLASTY Right 01/03/2018  . TOTAL KNEE ARTHROPLASTY Right 01/03/2018   Procedure: RIGHT TOTAL KNEE ARTHROPLASTY;  Surgeon: Valeria Batman, MD;  Location: [facility] OR;  Service: Orthopedics;  Laterality: Right;   Social History   Occupational History  . Not on file  Tobacco Use  . Smoking status: Never Smoker  . Smokeless tobacco: Never Used  Vaping Use  . Vaping Use: Never used  Substance and Sexual Activity  . Alcohol use: Yes    Alcohol/week: 1.0 standard drink    Types: 1 Glasses of wine per week    Comment: ONCE A week  . Drug use: No  . Sexual activity: Not Currently    Birth control/protection: Abstinence

## 2020-04-18 ENCOUNTER — Ambulatory Visit: Payer: BC Managed Care – PPO | Admitting: Physical Therapy

## 2020-04-18 ENCOUNTER — Encounter: Payer: Self-pay | Admitting: Physical Therapy

## 2020-04-18 DIAGNOSIS — M25511 Pain in right shoulder: Secondary | ICD-10-CM

## 2020-04-18 DIAGNOSIS — R29898 Other symptoms and signs involving the musculoskeletal system: Secondary | ICD-10-CM

## 2020-04-18 DIAGNOSIS — M6281 Muscle weakness (generalized): Secondary | ICD-10-CM

## 2020-04-18 DIAGNOSIS — M25611 Stiffness of right shoulder, not elsewhere classified: Secondary | ICD-10-CM

## 2020-04-18 NOTE — Therapy (Signed)
[facility] [address]  [address] [place], [place], [address] Phone: [phone]   Fax:  [phone]  Physical Therapy Treatment  Patient Details  Name: Lauren Frye MRN: [id number] Date of Birth: 09/02/1950 Referring Provider (PT): [name], MD   Encounter Date: 04/18/2020   PT End of Session - 04/18/20 0850    Visit Number 2    Number of Visits 14    Date for PT Re-Evaluation 05/19/20    Authorization Type BCBS    PT Start Time 0757    PT Stop Time 0845    PT Time Calculation (min) 48 min    Activity Tolerance Patient tolerated treatment well;Patient limited by pain    Behavior During Therapy [facility] for tasks assessed/performed           Past Medical History:  Diagnosis Date  . Arthritis   . Depression    denies  . Headache    migraines    Past Surgical History:  Procedure Laterality Date  . ABDOMINAL HYSTERECTOMY    . ANKLE FUSION    . BOWEL RESECTION    . KNEE ARTHROSCOPY    . KNEE SURGERY Bilateral    LEFT ARTHROCOPY  X3    ,  RT X1  . TONSILLECTOMY    . TOTAL KNEE ARTHROPLASTY Left 10/07/2015  . TOTAL KNEE ARTHROPLASTY Left 10/07/2015   Procedure: TOTAL KNEE ARTHROPLASTY;  Surgeon: Valeria Batman, MD;  Location: [facility] OR;  Service: Orthopedics;  Laterality: Left;  . TOTAL KNEE ARTHROPLASTY Right 01/03/2018  . TOTAL KNEE ARTHROPLASTY Right 01/03/2018   Procedure: RIGHT TOTAL KNEE ARTHROPLASTY;  Surgeon: Valeria Batman, MD;  Location: [facility] OR;  Service: Orthopedics;  Laterality: Right;    There were no vitals filed for this visit.   Subjective Assessment - 04/18/20 0759    Subjective Recent MD appointment went well- was told that she only needs to come to PT for another week. Has gotten her overhead pulley for home use. Exercise when reaching behind her back does not seem to work.    Pertinent History HA, L TKA 2017, R TKA 2019, ankle fusion    Diagnostic tests 02/02/20 R shoulder MRI: Mild  supraspinatus and infraspinatus tendinosis with tiny rim rent insertional tear near the interdigitating fibers. No full-thickness rotator cuff tear.    Patient Stated Goals decrease pain    Currently in Pain? No/denies                             [facility] Adult PT Treatment/Exercise - 04/18/20 0001      Shoulder Exercises: Supine   Other Supine Exercises R shoulder IR/ER with 1# x5, 2# x5   shoulder abducted to 45 deg for comfort     Shoulder Exercises: Seated   External Rotation AAROM;Right;10 reps    External Rotation Limitations 10x3" to tolerance; shoulder ER AAROM with forearm elevated on orange pball    Flexion AAROM;Right;10 reps    Flexion Limitations green pball rollouts to tolerance 10x3"    Abduction AAROM;Right;10 reps    ABduction Limitations with wand, to tolerance      Shoulder Exercises: Standing   External Rotation Strengthening;Right;10 reps;Theraband;15 reps;12 reps    Theraband Level (Shoulder External Rotation) Level 2 (Red)    External Rotation Limitations stopping at neutral     Internal Rotation Strengthening;Right;10 reps;Theraband    Theraband Level (Shoulder Internal Rotation) Level 2 (  Red)    Internal Rotation Limitations stopping at neutral       Shoulder Exercises: Pulleys   Flexion 3 minutes    Flexion Limitations to tolerance    ABduction 3 minutes    ABduction Limitations to tolerance      Shoulder Exercises: Stretch   Internal Rotation Stretch 5 reps    Internal Rotation Stretch Limitations with strap to tolerance 5x3"      Manual Therapy   Manual Therapy Passive ROM;Joint mobilization;Soft tissue mobilization;Myofascial release    Manual therapy comments supine    Joint Mobilization R shoulder AP and PA mobs as well as distraction grade III to tolerance    Soft tissue mobilization STM to R lateral deltoid and biceps muscle belly    Myofascial Release manual TPR to R lateral deltoid and biceps muscle belly   good twitch  response and report of improvement in pain   Passive ROM R shoulder PROM in all planes to tolerance with prolonged holds at end range                  PT Education - 04/18/20 0849    Education Details update to HEP    Person(s) Educated Patient    Methods Explanation;Demonstration;Tactile cues;Verbal cues;Handout    Comprehension Verbalized understanding;Returned demonstration            PT Short Term Goals - 04/18/20 0854      PT SHORT TERM GOAL #1   Title Pt will be independent with initial HEP     Time 2    Period Weeks    Status Achieved    Target Date 04/28/20             PT Long Term Goals - 04/18/20 0854      PT LONG TERM GOAL #1   Title Pt will be independent with advanced HEP program     Time 5    Period Weeks    Status On-going      PT LONG TERM GOAL #2   Title Patient to demonstrate R shoulder AROM WFL and without pain limiting.    Time 5    Period Weeks    Status On-going      PT LONG TERM GOAL #3   Title Patient to demonstrate R shoulder strength >=4+/5.    Time 5    Period Weeks    Status On-going      PT LONG TERM GOAL #4   Title Patient to report 0/10 pain in R shoulder after a full work day.    Time 5    Period Weeks    Status On-going      PT LONG TERM GOAL #5   Title Patient to return to light gym activities without pain limiting.     Time 5    Period Weeks    Status On-going                 Plan - 04/18/20 0854    Clinical Impression Statement Patient arrived to session with report that her MD advised her that she only needed 1 more week of PT. Noting that she has been using her overhead pulley at home. However, notes difficulty performing IR stretch as part of HEP. Worked on R shoulder PROM in all planes, with good improvement in ROM demonstrated in all planes. Patient somewhat limited by lateral shoulder pain at end ranges, thus addressed this with STM. Patient demonstrated good twitch response and report  of  improvement in pain after STM and TPR to lateral deltoid and biceps. Proceeded with shoulder AAROM in all directions to patient's tolerance. Reviewed Apley IR stretch with patient demonstrating proper form despite report of it feeling "awkward." Initiated RTC strengthening which patient performed with good control. Updated these exercises into HEP- patient reported understanding and without complaints at end of session. Patient progressing very well towards therapy goals.    Comorbidities HA, L TKA 2017, R TKA 2019, ankle fusion    PT Treatment/Interventions ADLs/Self Care Home Management;Cryotherapy;Electrical Stimulation;Iontophoresis 4mg /ml Dexamethasone;Moist Heat;Therapeutic exercise;Therapeutic activities;Functional mobility training;Ultrasound;Neuromuscular re-education;Patient/family education;Manual techniques;Vasopneumatic Device;Taping;Energy conservation;Dry needling;Passive range of motion    PT Next Visit Plan progress shoulder PROM/AAROM/AROM and strength    Consulted and Agree with Plan of Care Patient           Patient will benefit from skilled therapeutic intervention in order to improve the following deficits and impairments:  Increased edema, Decreased activity tolerance, Decreased strength, Increased fascial restricitons, Impaired UE functional use, Pain, Increased muscle spasms, Improper body mechanics, Decreased range of motion, Impaired flexibility, Postural dysfunction, Decreased scar mobility  Visit Diagnosis: Acute pain of right shoulder  Stiffness of right shoulder, not elsewhere classified  Muscle weakness (generalized)  Other symptoms and signs involving the musculoskeletal system     Problem List Patient Active Problem List   Diagnosis Date Noted  . Adhesive capsulitis of right shoulder 03/13/2020  . Impingement syndrome of right shoulder 01/24/2020  . S/P total knee arthroplasty, right 01/08/2018  . Primary localized osteoarthritis of right knee  01/03/2018  . Spinal stenosis of lumbar region 05/30/2017  . Postoperative anemia due to acute blood loss 10/11/2015  . Arthritis 10/11/2015  . Constipation 10/11/2015  . Headache 10/11/2015  . Osteoarthritis of left knee 10/07/2015  . Obesity 10/07/2015  . S/P total knee replacement using cement 10/07/2015    12/07/2015, PT, DPT 04/18/20 8:56 AM   [facility] [address]  [address] [place], [place], [place] Phone: [phone]   Fax:  [phone]  Name: Lauren Frye MRN: [id number] Date of Birth: 01/17/1951

## 2020-04-22 ENCOUNTER — Other Ambulatory Visit: Payer: Self-pay

## 2020-04-22 ENCOUNTER — Ambulatory Visit: Payer: BC Managed Care – PPO

## 2020-04-22 DIAGNOSIS — M25611 Stiffness of right shoulder, not elsewhere classified: Secondary | ICD-10-CM

## 2020-04-22 DIAGNOSIS — M6281 Muscle weakness (generalized): Secondary | ICD-10-CM

## 2020-04-22 DIAGNOSIS — M25511 Pain in right shoulder: Secondary | ICD-10-CM | POA: Diagnosis not present

## 2020-04-22 DIAGNOSIS — R29898 Other symptoms and signs involving the musculoskeletal system: Secondary | ICD-10-CM

## 2020-04-22 NOTE — Therapy (Signed)
[facility] Outpatient Rehabilitation [facility] [address]  [address] [place], [place], [address] Phone: [phone]   Fax:  [phone]  Physical Therapy Treatment  Patient Details  Name: Lauren Frye MRN: [id number] Date of Birth: [birth year] Referring Provider (PT): [name], MD   Encounter Date: 04/22/2020   PT End of Session - 04/22/20 1706    Visit Number 3    Number of Visits 14    Date for PT Re-Evaluation 05/19/20    Authorization Type BCBS    PT Start Time 1705    PT Stop Time 1746    PT Time Calculation (min) 41 min    Activity Tolerance Patient tolerated treatment well;Patient limited by pain    Behavior During Therapy [facility] for tasks assessed/performed           Past Medical History:  Diagnosis Date  . Arthritis   . Depression    denies  . Headache    migraines    Past Surgical History:  Procedure Laterality Date  . ABDOMINAL HYSTERECTOMY    . ANKLE FUSION    . BOWEL RESECTION    . KNEE ARTHROSCOPY    . KNEE SURGERY Bilateral    LEFT ARTHROCOPY  X3    ,  RT X1  . TONSILLECTOMY    . TOTAL KNEE ARTHROPLASTY Left 10/07/2015  . TOTAL KNEE ARTHROPLASTY Left 10/07/2015   Procedure: TOTAL KNEE ARTHROPLASTY;  Surgeon: Valeria Batman, MD;  Location: [facility] OR;  Service: Orthopedics;  Laterality: Left;  . TOTAL KNEE ARTHROPLASTY Right 01/03/2018  . TOTAL KNEE ARTHROPLASTY Right 01/03/2018   Procedure: RIGHT TOTAL KNEE ARTHROPLASTY;  Surgeon: Valeria Batman, MD;  Location: [facility] OR;  Service: Orthopedics;  Laterality: Right;    There were no vitals filed for this visit.   Subjective Assessment - 04/22/20 1713    Subjective Pt. doing ok.    Pertinent History HA, L TKA 2017, R TKA 2019, ankle fusion    Diagnostic tests 02/02/20 R shoulder MRI: Mild supraspinatus and infraspinatus tendinosis with tiny rim rent insertional tear near the interdigitating fibers. No full-thickness rotator cuff tear.    Patient Stated Goals decrease pain     Currently in Pain? Yes    Pain Score 6     Pain Location Shoulder    Pain Orientation Right;Lateral    Pain Descriptors / Indicators Tightness    Pain Type Acute pain    Multiple Pain Sites No                             [facility] Adult PT Treatment/Exercise - 04/22/20 0001      Shoulder Exercises: Seated   Flexion Both;10 reps;Weights;Strengthening    Flexion Weight (lbs) 2    Abduction Both;10 reps;Weights;Strengthening    ABduction Weight (lbs) 2      Shoulder Exercises: Standing   Horizontal ABduction Both;10 reps;Theraband;Strengthening    Theraband Level (Shoulder Horizontal ABduction) Level 2 (Red)    Horizontal ABduction Limitations B scapular retraction    External Rotation Both;10 reps;Strengthening;Theraband    Theraband Level (Shoulder External Rotation) Level 2 (Red)    External Rotation Limitations stopping at neutral     Flexion Right;Left;10 reps;Theraband;Strengthening    Theraband Level (Shoulder Flexion) Level 2 (Red)    Flexion Limitations Alternating flexion/extension (X) leaning on doorframe     Row Both;10 reps;Theraband      Shoulder Exercises: Pulleys   Flexion  3 minutes    Flexion Limitations to tolerance    ABduction 3 minutes    ABduction Limitations to tolerance      Shoulder Exercises: ROM/Strengthening   Cybex Row Limitations 10# x 15 reps - mid handles     Wall Pushups 15 reps    Other ROM/Strengthening Exercises BATCA cable low row 20# x 10 reps      Shoulder Exercises: Stretch   Other Shoulder Stretches R biceps stretch 2 x 30 sec                     PT Short Term Goals - 04/18/20 0854      PT SHORT TERM GOAL #1   Title Pt will be independent with initial HEP     Time 2    Period Weeks    Status Achieved    Target Date 04/28/20             PT Long Term Goals - 04/18/20 0854      PT LONG TERM GOAL #1   Title Pt will be independent with advanced HEP program     Time 5    Period Weeks    Status  On-going      PT LONG TERM GOAL #2   Title Patient to demonstrate R shoulder AROM WFL and without pain limiting.    Time 5    Period Weeks    Status On-going      PT LONG TERM GOAL #3   Title Patient to demonstrate R shoulder strength >=4+/5.    Time 5    Period Weeks    Status On-going      PT LONG TERM GOAL #4   Title Patient to report 0/10 pain in R shoulder after a full work day.    Time 5    Period Weeks    Status On-going      PT LONG TERM GOAL #5   Title Patient to return to light gym activities without pain limiting.     Time 5    Period Weeks    Status On-going                 Plan - 04/22/20 1727    Clinical Impression Statement Vibha reporting she wishes to make next session her last session in PT as her doctor had released her if she wanted.  Progressed ROM/strengthening activities for R shoulder with good tolerance today.  Updated HEP with biceps stretch to address pt. remaining tenderness in anterior shoulder/biceps.  Will plan for final goal testing and HEP review at upcoming session per supervising PT permission.    Comorbidities HA, L TKA 2017, R TKA 2019, ankle fusion    Rehab Potential Good    PT Treatment/Interventions ADLs/Self Care Home Management;Cryotherapy;Electrical Stimulation;Iontophoresis 4mg /ml Dexamethasone;Moist Heat;Therapeutic exercise;Therapeutic activities;Functional mobility training;Ultrasound;Neuromuscular re-education;Patient/family education;Manual techniques;Vasopneumatic Device;Taping;Energy conservation;Dry needling;Passive range of motion    PT Next Visit Plan progress shoulder PROM/AAROM/AROM and strength    Consulted and Agree with Plan of Care Patient           Patient will benefit from skilled therapeutic intervention in order to improve the following deficits and impairments:  Increased edema, Decreased activity tolerance, Decreased strength, Increased fascial restricitons, Impaired UE functional use, Pain, Increased  muscle spasms, Improper body mechanics, Decreased range of motion, Impaired flexibility, Postural dysfunction, Decreased scar mobility  Visit Diagnosis: Acute pain of right shoulder  Stiffness of right shoulder, not elsewhere classified  Muscle  weakness (generalized)  Other symptoms and signs involving the musculoskeletal system     Problem List Patient Active Problem List   Diagnosis Date Noted  . Adhesive capsulitis of right shoulder 03/13/2020  . Impingement syndrome of right shoulder 01/24/2020  . S/P total knee arthroplasty, right 01/08/2018  . Primary localized osteoarthritis of right knee 01/03/2018  . Spinal stenosis of lumbar region 05/30/2017  . Postoperative anemia due to acute blood loss 10/11/2015  . Arthritis 10/11/2015  . Constipation 10/11/2015  . Headache 10/11/2015  . Osteoarthritis of left knee 10/07/2015  . Obesity 10/07/2015  . S/P total knee replacement using cement 10/07/2015    Kermit Balo, PTA 04/22/20 5:52 PM     [facility] [address]  [address] [place], [place], [address] Phone: [phone]   Fax:  [phone]  Name: DELMA DRONE MRN: [id number] Date of Birth: 02/04/1951

## 2020-04-24 ENCOUNTER — Ambulatory Visit: Payer: BC Managed Care – PPO

## 2020-04-24 ENCOUNTER — Other Ambulatory Visit: Payer: Self-pay

## 2020-04-24 DIAGNOSIS — M6281 Muscle weakness (generalized): Secondary | ICD-10-CM

## 2020-04-24 DIAGNOSIS — M25511 Pain in right shoulder: Secondary | ICD-10-CM

## 2020-04-24 DIAGNOSIS — R29898 Other symptoms and signs involving the musculoskeletal system: Secondary | ICD-10-CM

## 2020-04-24 DIAGNOSIS — M25611 Stiffness of right shoulder, not elsewhere classified: Secondary | ICD-10-CM

## 2020-04-24 NOTE — Therapy (Addendum)
[facility] [address]  [place], [place], [address] Phone: [phone]   Fax:  [phone]  Physical Therapy Treatment  Patient Details  Name: KIARI HOSMER MRN: [id number] Date of Birth: 1950/08/21 Referring Provider (PT): Joni Fears, MD   Encounter Date: 04/24/2020   PT End of Session - 04/24/20 1659    Visit Number 4    Number of Visits 14    Date for PT Re-Evaluation 05/19/20    Authorization Type BCBS    PT Start Time 1652    PT Stop Time 1724    PT Time Calculation (min) 32 min    Activity Tolerance Patient tolerated treatment well;Patient limited by pain    Behavior During Therapy [facility] for tasks assessed/performed           Past Medical History:  Diagnosis Date  . Arthritis   . Depression    denies  . Headache    migraines    Past Surgical History:  Procedure Laterality Date  . ABDOMINAL HYSTERECTOMY    . ANKLE FUSION    . BOWEL RESECTION    . KNEE ARTHROSCOPY    . KNEE SURGERY Bilateral    LEFT ARTHROCOPY  X3    ,  RT X1  . TONSILLECTOMY    . TOTAL KNEE ARTHROPLASTY Left 10/07/2015  . TOTAL KNEE ARTHROPLASTY Left 10/07/2015   Procedure: TOTAL KNEE ARTHROPLASTY;  Surgeon: Garald Balding, MD;  Location: [facility];  Service: Orthopedics;  Laterality: Left;  . TOTAL KNEE ARTHROPLASTY Right 01/03/2018  . TOTAL KNEE ARTHROPLASTY Right 01/03/2018   Procedure: RIGHT TOTAL KNEE ARTHROPLASTY;  Surgeon: Garald Balding, MD;  Location: [place];  Service: Orthopedics;  Laterality: Right;    There were no vitals filed for this visit.   Subjective Assessment - 04/24/20 1657    Subjective Wishing to finish up with therapy after today.    Pertinent History HA, L TKA 2017, R TKA 2019, ankle fusion    Diagnostic tests 02/02/20 R shoulder MRI: Mild supraspinatus and infraspinatus tendinosis with tiny rim rent insertional tear near the interdigitating fibers. No full-thickness rotator cuff tear.     Patient Stated Goals decrease pain    Currently in Pain? No/denies    Pain Score 0-No pain    Pain Location Shoulder    Pain Orientation Right;Lateral    Pain Type Acute pain    Multiple Pain Sites No              [facility] PT Assessment - 04/24/20 0001      AROM   Right/Left Shoulder Right    Right Shoulder Flexion 158 Degrees    Right Shoulder ABduction 156 Degrees   tight   Right Shoulder Internal Rotation --   FIR to T8   Right Shoulder External Rotation --   FER T2     Strength   Strength Assessment Site Shoulder    Right/Left Shoulder Right;Left    Right Shoulder Flexion 4+/5    Right Shoulder ABduction 4+/5    Right Shoulder Internal Rotation 4+/5    Right Shoulder External Rotation 4+/5    Left Shoulder Flexion 4+/5    Left Shoulder ABduction 4+/5    Left Shoulder Internal Rotation 4+/5    Left Shoulder External Rotation 4+/5                         [facility] Adult PT Treatment/Exercise -  04/24/20 0001      Self-Care   Self-Care Other Self-Care Comments    Other Self-Care Comments  Instruction in final HEP review       Shoulder Exercises: ROM/Strengthening   UBE (Upper Arm Bike) L1 x 3 min forward/1 min backwards      Shoulder Exercises: Stretch   Other Shoulder Stretches R biceps stretch 2 x 30 sec                   PT Education - 04/24/20 1729    Education Details HEP update    Person(s) Educated Patient    Methods Explanation;Demonstration;Verbal cues;Handout    Comprehension Verbalized understanding;Returned demonstration;Verbal cues required            PT Short Term Goals - 04/18/20 0854      PT SHORT TERM GOAL #1   Title Pt will be independent with initial HEP     Time 2    Period Weeks    Status Achieved    Target Date 04/28/20             PT Long Term Goals - 04/24/20 1700      PT LONG TERM GOAL #1   Title Pt will be independent with advanced HEP program     Time 5    Period Weeks    Status Achieved    04/24/20     PT LONG TERM GOAL #2   Title Patient to demonstrate R shoulder AROM WFL and without pain limiting.    Time 5    Period Weeks    Status Achieved   04/24/20     PT LONG TERM GOAL #3   Title Patient to demonstrate R shoulder strength >=4+/5.    Time 5    Period Weeks    Status Achieved   04/24/20     PT LONG TERM GOAL #4   Title Patient to report 0/10 pain in R shoulder after a full work day.    Time 5    Period Weeks    Status Achieved   04/24/20     PT LONG TERM GOAL #5   Title Patient to return to light gym activities without pain limiting.     Time 5    Period Weeks    Status Achieved   04/24/20                Plan - 04/24/20 1705    Clinical Impression Statement Braylee doing well and wishes to finish with therapy after today noting that MD released her from therapy.  Pt. wishing to go on a 30-day hold with supervising PT approving this plan.  Pt. has met LTG #1 as she denies questions regarding HEP.  LTG #2 met as pt. able to demonstrate R shoulder AROM WFL without pain limiting.  Shannyn has met LTG #3 as she demonstrates R shoulder strength of 4+/5 in all muscle groups at R shoulder with MMT.  LTG #4 met as pt. able to report no pain after full work shift.  Has already transitioning to returning to gym program without issue thus LTG #5 met.  Pt. denies questions and now on 30-day hold from PT.    Comorbidities HA, L TKA 2017, R TKA 2019, ankle fusion    Rehab Potential Good    PT Treatment/Interventions ADLs/Self Care Home Management;Cryotherapy;Electrical Stimulation;Iontophoresis 53m/ml Dexamethasone;Moist Heat;Therapeutic exercise;Therapeutic activities;Functional mobility training;Ultrasound;Neuromuscular re-education;Patient/family education;Manual techniques;Vasopneumatic Device;Taping;Energy conservation;Dry needling;Passive range of motion    PT  Next Visit Plan progress shoulder PROM/AAROM/AROM and strength    Consulted and Agree with Plan of Care  Patient           Patient will benefit from skilled therapeutic intervention in order to improve the following deficits and impairments:  Increased edema, Decreased activity tolerance, Decreased strength, Increased fascial restricitons, Impaired UE functional use, Pain, Increased muscle spasms, Improper body mechanics, Decreased range of motion, Impaired flexibility, Postural dysfunction, Decreased scar mobility  Visit Diagnosis: Acute pain of right shoulder  Stiffness of right shoulder, not elsewhere classified  Muscle weakness (generalized)  Other symptoms and signs involving the musculoskeletal system     Problem List Patient Active Problem List   Diagnosis Date Noted  . Adhesive capsulitis of right shoulder 03/13/2020  . Impingement syndrome of right shoulder 01/24/2020  . S/P total knee arthroplasty, right 01/08/2018  . Primary localized osteoarthritis of right knee 01/03/2018  . Spinal stenosis of lumbar region 05/30/2017  . Postoperative anemia due to acute blood loss 10/11/2015  . Arthritis 10/11/2015  . Constipation 10/11/2015  . Headache 10/11/2015  . Osteoarthritis of left knee 10/07/2015  . Obesity 10/07/2015  . S/P total knee replacement using cement 10/07/2015   Bess Harvest, PTA 04/24/20 5:36 PM   [facility] [address]  [place], [place], [address] Phone: [phone]   Fax:  [phone]  Name: MAKIA BOSSI MRN: [id number] Date of Birth: Jan 26, 1951  PHYSICAL THERAPY DISCHARGE SUMMARY  Visits from Start of Care: 4  Current functional level related to goals / functional outcomes: See above clinical impression; patient did not return during 30 day hold   Remaining deficits: none   Education / Equipment: HEP  Plan: Patient agrees to discharge.  Patient goals were met. Patient is being discharged due to meeting the stated rehab goals.  ?????     Janene Harvey, PT,  DPT 06/09/20 2:50 PM

## 2020-05-01 ENCOUNTER — Encounter: Payer: BC Managed Care – PPO | Admitting: Physical Therapy

## 2020-05-08 ENCOUNTER — Encounter: Payer: BC Managed Care – PPO | Admitting: Physical Therapy

## 2021-01-01 ENCOUNTER — Ambulatory Visit: Payer: BC Managed Care – PPO | Admitting: Orthopaedic Surgery

## 2021-01-01 ENCOUNTER — Encounter: Payer: Self-pay | Admitting: Orthopaedic Surgery

## 2021-01-01 ENCOUNTER — Other Ambulatory Visit: Payer: Self-pay

## 2021-01-01 DIAGNOSIS — M79641 Pain in right hand: Secondary | ICD-10-CM | POA: Diagnosis not present

## 2021-01-01 NOTE — Progress Notes (Signed)
Office Visit Note   Patient: Lauren Frye           Date of Birth: 1951-04-12           MRN: [id number] Visit Date: 01/01/2021              Requested by: Kerin Salen, PA-C [address] [place],  [place] [address] PCP: Kerin Salen, PA-C   Assessment & Plan: Visit Diagnoses:  1. Pain in right hand     Plan: [name] has been experiencing bilateral hand pain but more on the right than the left for about 6 weeks.  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  She will has little bit of swelling and stiffness of her hands.  In the past she has had trigger fingers but does not feel like that is the problem at present.  On occasion she has had charley horses in her "pinky" she denies any numbness or tingling in any of the digits and not having any pain in her elbow.  She does take Aleve and it does not seem to make a difference. I did not find anything by exam.  She did not have evidence of carpal tunnel or ulnar nerve impingement with negative pain about the medial aspect of the elbow negative Phalen's and Tinel's.  Has a little bit of swelling across the index and long metacarpal phalangeal joints and probably has some early infection inflammation or arthritis.  No obvious active triggering.  Hands were warm with good capillary refill and good opposition of thumb the little finger.  I think she just has an overuse syndrome with her hands and I want her to be careful about applying pressure over the ulnar nerves at the elbows when she is at the computer but she should continue with the Aleve or even try Voltaren gel to any of the joints that are uncomfortable.  Follow-Up Instructions: Return if symptoms worsen or fail to improve.   Orders:  No orders of the defined types were placed in this encounter.  No orders of the defined types were placed in this encounter.     Procedures: No procedures  performed   Clinical Data: No additional findings.   Subjective: Chief Complaint  Patient presents with   Right Hand - Pain   Left Hand - Pain  Patient presents today with bilateral hand pain. She said that it started about 6 weeks ago with "charlie horses in the pinky fingers". No injury. Since then the pain now radiates up from her pinky finger in the right hand. She said that both hands swell. 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 She takes Aleve as needed. She is right hand dominant.  Denies any numbness or tingling in any of her digits.  No elbow pain.  Has had some stiffness across the metacarpal phalangeal joints of the index and long fingers more on the right than the left hand with occasional stiffness  HPI  Review of Systems   Objective: Vital Signs: Ht 5\' 6"  (1.676 m)   Wt 225 lb (102.1 kg)   BMI 36.32 kg/m   Physical Exam Constitutional:      Appearance: She is well-developed.  Eyes:     Pupils: Pupils are equal, round, and reactive to light.  Pulmonary:     Effort: Pulmonary effort is normal.  Skin:    General: Skin is warm and dry.  Neurological:     Mental Status: She is alert and oriented to person, place, and time.  Psychiatric:        Behavior: Behavior normal.    Ortho Exam full flexion extension of all digits of both hands.  Little bit of swelling about the metacarpal phalangeal joints of the right index and long finger but excellent motion.  Notes active triggering.  No pain over any of the A1 pulleys.  Good opposition of thumb the little finger.  Negative Tinel's and Phalen's over the median nerve of both hands.  No pain or tingling over the ulnar nerve at either elbow.  Full range of motion of the elbow.  No forearm pain.  Good capillary refill to the fingers.  Nails intact  Specialty Comments:  No specialty comments available.  Imaging: No results found.   PMFS History: Patient Active Problem List   Diagnosis Date Noted   Pain in right hand  01/01/2021   Adhesive capsulitis of right shoulder 03/13/2020   Impingement syndrome of right shoulder 01/24/2020   S/P total knee arthroplasty, right 01/08/2018   Primary localized osteoarthritis of right knee 01/03/2018   Spinal stenosis of lumbar region 05/30/2017   Postoperative anemia due to acute blood loss 10/11/2015   Arthritis 10/11/2015   Constipation 10/11/2015   Headache 10/11/2015   Osteoarthritis of left knee 10/07/2015   Obesity 10/07/2015   S/P total knee replacement using cement 10/07/2015   Past Medical History:  Diagnosis Date   Arthritis    Depression    denies   Headache    migraines    Family History  Problem Relation Age of Onset   Parkinson's disease Mother    COPD Mother    Diabetes Sister     Past Surgical History:  Procedure Laterality Date   ABDOMINAL HYSTERECTOMY     ANKLE FUSION     BOWEL RESECTION     KNEE ARTHROSCOPY     KNEE SURGERY Bilateral    LEFT ARTHROCOPY  X3    ,  RT X1   TONSILLECTOMY     TOTAL KNEE ARTHROPLASTY Left 10/07/2015   TOTAL KNEE ARTHROPLASTY Left 10/07/2015   Procedure: TOTAL KNEE ARTHROPLASTY;  Surgeon: Valeria Batman, MD;  Location: [facility] OR;  Service: Orthopedics;  Laterality: Left;   TOTAL KNEE ARTHROPLASTY Right 01/03/2018   TOTAL KNEE ARTHROPLASTY Right 01/03/2018   Procedure: RIGHT TOTAL KNEE ARTHROPLASTY;  Surgeon: Valeria Batman, MD;  Location: [facility] OR;  Service: Orthopedics;  Laterality: Right;   Social History   Occupational History   Not on file  Tobacco Use   Smoking status: Never   Smokeless tobacco: Never  Vaping Use   Vaping Use: Never used  Substance and Sexual Activity   Alcohol use: Yes    Alcohol/week: 1.0 standard drink    Types: 1 Glasses of wine per week    Comment: ONCE A week   Drug use: No   Sexual activity: Not Currently    Birth control/protection: Abstinence

## 2021-11-27 ENCOUNTER — Encounter: Payer: Self-pay | Admitting: Physician Assistant

## 2021-11-27 ENCOUNTER — Ambulatory Visit (INDEPENDENT_AMBULATORY_CARE_PROVIDER_SITE_OTHER): Payer: Medicare HMO | Admitting: Physician Assistant

## 2021-11-27 ENCOUNTER — Ambulatory Visit (INDEPENDENT_AMBULATORY_CARE_PROVIDER_SITE_OTHER): Payer: Medicare HMO

## 2021-11-27 DIAGNOSIS — M542 Cervicalgia: Secondary | ICD-10-CM

## 2021-11-27 DIAGNOSIS — M7541 Impingement syndrome of right shoulder: Secondary | ICD-10-CM | POA: Diagnosis not present

## 2021-11-27 DIAGNOSIS — M7501 Adhesive capsulitis of right shoulder: Secondary | ICD-10-CM | POA: Diagnosis not present

## 2021-11-27 MED ORDER — BUPIVACAINE HCL 0.25 % IJ SOLN
2.0000 mL | INTRAMUSCULAR | Status: AC | PRN
  Administered 2021-11-27: 2 mL via INTRA_ARTICULAR

## 2021-11-27 MED ORDER — METHYLPREDNISOLONE ACETATE 40 MG/ML IJ SUSP
80.0000 mg | INTRAMUSCULAR | Status: AC | PRN
  Administered 2021-11-27: 80 mg via INTRA_ARTICULAR

## 2021-11-27 MED ORDER — LIDOCAINE HCL 1 % IJ SOLN
2.0000 mL | INTRAMUSCULAR | Status: AC | PRN
  Administered 2021-11-27: 2 mL

## 2021-11-27 MED ORDER — METHOCARBAMOL 500 MG PO TABS: 500.0000 mg | ORAL_TABLET | Freq: Four times a day (QID) | ORAL | 0 refills | Status: DC | PRN

## 2021-11-27 MED ORDER — MELOXICAM 15 MG PO TABS: 15.0000 mg | ORAL_TABLET | Freq: Every day | ORAL | 2 refills | Status: DC

## 2021-11-27 MED ORDER — MELOXICAM 15 MG PO TABS: 15.0000 mg | ORAL_TABLET | Freq: Every day | ORAL | 1 refills | Status: DC

## 2021-11-27 NOTE — Progress Notes (Signed)
Office Visit Note   Patient: Lauren Frye           Date of Birth: 1951-02-14           MRN: [id number] Visit Date: 11/27/2021              Requested by: Kerin Salen'Connor, Lauren Elizabeth, PA-C [address] [place],  [place] [id number] PCP: Kerin Salen'Connor, Lauren Elizabeth, PA-C  Chief Complaint  Patient presents with   Neck - Pain  Right shoulder rotator cuff tendinitis    HPI: Patient is a pleasant 71 year old patient of Dr. Hoy RegisterWhitfield's.  She comes in today complaining of neck pain and right shoulder pain.  She she does have a history of adhesive capsulitis in the right shoulder which required a manipulation under anesthesia.  She says she was lifting weights in the gym about a week ago.  Following that she had a lot of spasm and pain in her neck.  She also had pain in her shoulder running down her arm.  Denies any paresthesias.  Does feel like her shoulder is a little weak.  Assessment & Plan: Visit Diagnoses:  1. Neck pain, acute   2. Adhesive capsulitis of right shoulder   3. Impingement syndrome of right shoulder     Plan: I think she has a combination of a neck strain with a lot of muscular tightness as well as some rotator cuff tendinitis and some concern that she is slightly weaker cannot rule out a partial rotator cuff tear.  We will go forward with an injection into her shoulder today.  Have also prescribed meloxicam and a right muscle relaxant which she is not to take when she is driving or active.  She will work with physical therapy for the next month and follow-up with Dr. Cleophas DunkerWhitfield  Follow-Up Instructions: No follow-ups on file.   Ortho Exam  Patient is alert, oriented, no adenopathy, well-dressed, normal affect, normal respiratory effort. Examination of her neck she does have a lot of spasm on the right side of her neck.  She has some slight pain with turning towards the right and bending forward.  Slight stiffness with extension.  Distal strength and sensation is intact  good grip strength.  Right shoulder she does have good overhead motion slight decrease in motion behind her back.  Her strength is 4 out of 5 compared to the unaffected side.  She does have some findings consistent with impingement  Imaging: XR Cervical Spine 2 or 3 views  Result Date: 11/27/2021 2 views of the cervical spine today demonstrate some loss of the normal lordotic curve.  Some degenerative changes at C4-5 with anterior osteophyte formation.  No listhesis.  No acute fracture.  No images are attached to the encounter.  Labs: Lab Results  Component Value Date   ESRSEDRATE 25 04/28/2017   CRP 1.9 04/28/2017   REPTSTATUS 12/24/2017 FINAL 12/23/2017   CULT (A) 12/23/2017    <10,000 COLONIES/mL INSIGNIFICANT GROWTH Performed at [facility], [address] [place], [place] [id number]    [facility] 09/[facility]/2015     Lab Results  Component Value Date   ALBUMIN 3.9 12/23/2017   ALBUMIN 3.5 05/28/2016   ALBUMIN 4.0 09/24/2015    No results found for: MG No results found for: VD25OH  No results found for: PREALBUMIN    Latest Ref Rng & Units 11/28/2018    2:36 PM 01/11/2018   12:00 AM 01/06/2018    4:28 AM  CBC  EXTENDED  WBC 4.0 - 10.5 K/uL 10.2   9.0      9.3    RBC 3.87 - 5.11 Mil/uL 4.44    3.41    Hemoglobin 12.0 - 15.0 g/dL 41.9   37.9      02.4    HCT 36.0 - 46.0 % 40.6   33      31.6    Platelets 150.0 - 400.0 K/uL 286.0   394      214       This result is from an external source.     There is no height or weight on file to calculate BMI.  Orders:  Orders Placed This Encounter  Procedures   XR Cervical Spine 2 or 3 views   Ambulatory referral to Physical Therapy   Meds ordered this encounter  Medications   DISCONTD: meloxicam (MOBIC) 15 MG tablet    Sig: Take 1 tablet (15 mg total) by mouth daily.    Dispense:  30 tablet    Refill:  2   DISCONTD: methocarbamol (ROBAXIN) 500 MG tablet    Sig: Take 1 tablet (500 mg total) by mouth  every 6 (six) hours as needed for muscle spasms.    Dispense:  30 tablet    Refill:  0   methocarbamol (ROBAXIN) 500 MG tablet    Sig: Take 1 tablet (500 mg total) by mouth every 6 (six) hours as needed for muscle spasms.    Dispense:  30 tablet    Refill:  0   meloxicam (MOBIC) 15 MG tablet    Sig: Take 1 tablet (15 mg total) by mouth daily.    Dispense:  30 tablet    Refill:  1     Procedures: Large Joint Inj: R subacromial bursa on 11/27/2021 11:36 AM Indications: diagnostic evaluation and pain Details: 25 G 1.5 in Frye, posterior approach  Arthrogram: No  Medications: 2 mL lidocaine 1 %; 2 mL bupivacaine 0.25 %; 80 mg methylPREDNISolone acetate 40 MG/ML Outcome: tolerated well, no immediate complications  Please confirm with patient that she has had cortisone injections without any problem her difficulties with prednisone are orally with oral prednisone Procedure, treatment alternatives, risks and benefits explained, specific risks discussed. Consent was given by the patient.     Clinical Data: No additional findings.  ROS:  All other systems negative, except as noted in the HPI. Review of Systems  Objective: Vital Signs: There were no vitals taken for this visit.  Specialty Comments:  No specialty comments available.  PMFS History: Patient Active Problem List   Diagnosis Date Noted   Pain in right hand 01/01/2021   Adhesive capsulitis of right shoulder 03/13/2020   Impingement syndrome of right shoulder 01/24/2020   S/P total knee arthroplasty, right 01/08/2018   Primary localized osteoarthritis of right knee 01/03/2018   Spinal stenosis of lumbar region 05/30/2017   Postoperative anemia due to acute blood loss 10/11/2015   Arthritis 10/11/2015   Constipation 10/11/2015   Headache 10/11/2015   Osteoarthritis of left knee 10/07/2015   Obesity 10/07/2015   S/P total knee replacement using cement 10/07/2015   Past Medical History:  Diagnosis Date    Arthritis    Depression    denies   Headache    migraines    Family History  Problem Relation Age of Onset   Parkinson's disease Mother    COPD Mother    Diabetes Sister     Past Surgical History:  Procedure Laterality  Date   ABDOMINAL HYSTERECTOMY     ANKLE FUSION     BOWEL RESECTION     KNEE ARTHROSCOPY     KNEE SURGERY Bilateral    LEFT ARTHROCOPY  X3    ,  RT X1   TONSILLECTOMY     TOTAL KNEE ARTHROPLASTY Left 10/07/2015   TOTAL KNEE ARTHROPLASTY Left 10/07/2015   Procedure: TOTAL KNEE ARTHROPLASTY;  Surgeon: Valeria Batman, MD;  Location[facility];  Service: Orthopedics;  Laterality: Left;   TOTAL KNEE ARTHROPLASTY Right 01/03/2018   TOTAL KNEE ARTHROPLASTY Right 01/03/2018   Procedure: RIGHT TOTAL KNEE ARTHROPLASTY;  Surgeon: Valeria Batman, MD;  Location: [facility] OR;  Service: Orthopedics;  Laterality: Right;   Social History   Occupational History   Not on file  Tobacco Use   Smoking status: Never   Smokeless tobacco: Never  Vaping Use   Vaping Use: Never used  Substance and Sexual Activity   Alcohol use: Yes    Alcohol/week: 1.0 standard drink    Types: 1 Glasses of wine per week    Comment: ONCE A week   Drug use: No   Sexual activity: Not Currently    Birth control/protection: Abstinence

## 2021-12-08 NOTE — Therapy (Signed)
OUTPATIENT PHYSICAL THERAPY CERVICAL EVALUATION   Patient Name: Lauren Frye MRN: [id number] DOB:July 23, 1950, 71 y.o., female Today's Date: 12/09/2021   PT End of Session - 12/09/21 1018     Visit Number 1    Number of Visits 12    Date for PT Re-Evaluation 01/20/22    Authorization Type [employer] MCR    PT Start Time 1015    PT Stop Time 1105    PT Time Calculation (min) 50 min    Activity Tolerance Patient limited by pain    Behavior During Therapy WFL for tasks assessed/performed             Past Medical History:  Diagnosis Date   Arthritis    Depression    denies   Headache    migraines   Past Surgical History:  Procedure Laterality Date   ABDOMINAL HYSTERECTOMY     ANKLE FUSION     BOWEL RESECTION     KNEE ARTHROSCOPY     KNEE SURGERY Bilateral    LEFT ARTHROCOPY  X3    ,  RT X1   TONSILLECTOMY     TOTAL KNEE ARTHROPLASTY Left 10/07/2015   TOTAL KNEE ARTHROPLASTY Left 10/07/2015   Procedure: TOTAL KNEE ARTHROPLASTY;  Surgeon: Valeria Batman, MD;  Location: [facility] OR;  Service: Orthopedics;  Laterality: Left;   TOTAL KNEE ARTHROPLASTY Right 01/03/2018   TOTAL KNEE ARTHROPLASTY Right 01/03/2018   Procedure: RIGHT TOTAL KNEE ARTHROPLASTY;  Surgeon: Valeria Batman, MD;  Location: [facility] OR;  Service: Orthopedics;  Laterality: Right;   Patient Active Problem List   Diagnosis Date Noted   Pain in right hand 01/01/2021   Adhesive capsulitis of right shoulder 03/13/2020   Impingement syndrome of right shoulder 01/24/2020   S/P total knee arthroplasty, right 01/08/2018   Primary localized osteoarthritis of right knee 01/03/2018   Spinal stenosis of lumbar region 05/30/2017   Postoperative anemia due to acute blood loss 10/11/2015   Arthritis 10/11/2015   Constipation 10/11/2015   Headache 10/11/2015   Osteoarthritis of left knee 10/07/2015   Obesity 10/07/2015   S/P total knee replacement using cement 10/07/2015    PCP: Kerin Salen,  PA-C  REFERRING PROVIDER: Persons, [place], PA  REFERRING DIAG: M54.2 (ICD-10-CM) - Neck pain, acute M75.01 (ICD-10-CM) - Adhesive capsulitis of right shoulder  THERAPY DIAG:  Cervicalgia  Acute pain of right shoulder  Muscle weakness (generalized)  Cramp and spasm  Rationale for Evaluation and Treatment Rehabilitation  ONSET DATE: May 2023  SUBJECTIVE:  SUBJECTIVE STATEMENT: "They don't know what is wrong, insurance wont do an MRI until come for PT.  I was moving patio blocks and lifting weights at gym, only 4 lbs, not sure what did it but was in excruciating pain for a week and a half when I went to the doctor.  I just retired."  Reported the pain was excruciating for several days until cortisone shot kicked in.  Right hand dominant  PERTINENT HISTORY:  From MD notes 11/27/2021 "She comes in today complaining of neck pain and right shoulder pain.  She she does have a history of adhesive capsulitis in the right shoulder which required a manipulation under anesthesia.  She says she was lifting weights in the gym about a week ago.  Following that she had a lot of spasm and pain in her neck.  She also had pain in her shoulder running down her arm.  Denies any paresthesias.  Does feel like her shoulder is a little weak."  Given injection in R shoulder and prescribed meloxicam and muscle relaxer.   PAIN:  Are you having pain? Yes: NPRS scale: 3/10 Pain location: neck down to R arm Pain description: worst 3/10 with muscle relaxer last 3 days. Aggravating factors: difficulty sleeping, driving Relieving factors: muscle relaxor  PRECAUTIONS: None  WEIGHT BEARING RESTRICTIONS No  FALLS:  Has patient fallen in last 6 months? No  LIVING ENVIRONMENT: Lives with: lives alone Lives in:  House/apartment Stairs: No Has following equipment at home: None  OCCUPATION: just retired, used to [employer].  Enjoy gardening, cards, goes to [employer]  PLOF: Independent  PATIENT GOALS "I want my strength back in my arm."  OBJECTIVE:   DIAGNOSTIC FINDINGS:  XR Cervical Spine 2 or 3 views   Result Date: 11/27/2021 2 views of the cervical spine today demonstrate some loss of the normal lordotic curve.  Some degenerative changes at C4-5 with anterior osteophyte formation.  No listhesis.  No acute fracture.   PATIENT SURVEYS:  NDI 22% impairment Quick Dash 30% impairment   COGNITION: Overall cognitive status: Within functional limits for tasks assessed   SENSATION: WFL but reports tingling into R hand  POSTURE: rounded shoulders and forward head  PALPATION: Tenderness and trigger points R UT, R levator, cervical paraspinals, decreased cervical mobility, elevated R 1st rib with tenderness in scalenes.  Increased neural tension in RUE.    CERVICAL ROM:   Active ROM A/PROM (deg) eval  Flexion 40  Extension 55  Right lateral flexion 22  Left lateral flexion 35  Right rotation 45  Left rotation 70   (Blank rows = not tested)  UPPER EXTREMITY ROM:  Active ROM Right eval Left eval  Shoulder flexion 105 150  Shoulder extension    Shoulder abduction 95 140   (Blank rows = not tested)  UPPER EXTREMITY MMT:  MMT Right* eval Left eval  Shoulder flexion 3-/5 5  Shoulder extension    Shoulder abduction 3-/5 5  Shoulder adduction    Shoulder extension    Shoulder internal rotation 3/5 5  Shoulder external rotation 5/5 5  Elbow flexion 3/5 5  Elbow extension 3/5 5  Wrist flexion    Wrist extension    Grip strength 5 lbs  20 lbs   (Blank rows = not tested) *dominant hand  CERVICAL SPECIAL TESTS:  Spurling's test: Negative   TODAY'S TREATMENT:  12/09/2021 Self Care: education on findings, POC, heat v. Cold, initial HEP. MHP to neck in supine x 10  min  PATIENT EDUCATION:  Education details: see self care Person educated: Patient Education method: Explanation, Demonstration, Verbal cues, and Handouts Education comprehension: verbalized understanding   HOME EXERCISE PROGRAM: Access Code: [email or web address] URL: [email or web address] Date: 12/09/2021 Prepared by: Harrie ForemanElizabeth Zuriel Roskos  Exercises - Supine Chin Tuck  - 1 x daily - 7 x weekly - 10 reps - 5 sec  hold - Seated Gentle Upper Trapezius Stretch  - 2 x daily - 7 x weekly - 3 reps - 10-15 sec hold - Gentle Levator Scapulae Stretch  - 2 x daily - 7 x weekly - 3 reps - 10-15 sec hold - Seated Shoulder Rolls  - 3 x daily - 7 x weekly - 10 reps - Seated Cervical Rotation AROM  - 3 x daily - 7 x weekly - 5 reps - Isometric Shoulder Internal Rotation  - 3 x daily - 7 x weekly - 5 reps - 5 sec  hold - Isometric Shoulder External Rotation  - 3 x daily - 7 x weekly - 5 reps - 5 sec  hold - Standing Isometric Shoulder Extension with Doorway - Arm Bent  - 3 x daily - 7 x weekly - 5 reps - 5 sec  hold - Standing Isometric Shoulder Flexion with Doorway - Arm Bent  - 3 x daily - 7 x weekly - 5 reps - 5 sec  hold  ASSESSMENT:  CLINICAL IMPRESSION: Patient is a 71 y.o. female who was seen today for physical therapy evaluation and treatment for Neck and R shoulder pain.  She demonstrates increased trigger points and pain throughout R sided cervical and periscapular musculature, decreased strength and ROM in RUE, decreased cervical ROM and increased neural tension in RUE.   She reports symptoms are returning as she has run out of muscle relaxer.  Reports 22% impairment on NDI and 30% impairment on DASH.  Educated today that findings are consistent with severe muscle spasms and recommended requesting refill while address muscle spasm in PT to improve tolerance, as very guarded today and poor tolerance to manual therapy.  Given initial HEP for cervical AROM, gentle stretches and isometric shoulder  exercises to help decrease pain.  Applied MHP in supine at end of session to decrease spasm.  She would benefit from skilled physical therapy to decrease pain, improve ROM and R arm strength in order to improve QOL and return to activities without limitation.    OBJECTIVE IMPAIRMENTS decreased ROM, decreased strength, hypomobility, increased fascial restrictions, increased muscle spasms, impaired flexibility, impaired sensation, impaired UE functional use, postural dysfunction, and pain.   ACTIVITY LIMITATIONS carrying, lifting, bending, sleeping, dressing, and reach over head  PARTICIPATION LIMITATIONS: cleaning, laundry, driving, shopping, community activity, and yard work  PERSONAL FACTORS 1 comorbidity: history of R adhesive capsulitis  are also affecting patient's functional outcome.   REHAB POTENTIAL: Good  CLINICAL DECISION MAKING: Stable/uncomplicated  EVALUATION COMPLEXITY: Low  GOALS: Goals reviewed with patient? Yes  SHORT TERM GOALS: Target date: 12/23/2021   Patient will be independent with initial HEP.  Baseline: given Goal status: INITIAL   LONG TERM GOALS: Target date: 01/20/2022   Patient will be independent with advanced/ongoing HEP to improve outcomes and carryover.  Baseline: needs update Goal status: INITIAL  2.  Patient will report 75% improvement in neck/RUE pain to improve QOL.  Baseline: 3/10 but increasing Goal status: INITIAL  3.  Patient will demonstrate full pain free cervical ROM for safety with driving.  Baseline: see above Goal status: INITIAL  4.  Patient will WNL R shoulder AROM without pain to perform ADLs and yard work. Baseline: see objective, painful and guarded movements.  Goal status: INITIAL  5.  Patient will report <20% impairment on DASH to demonstrate improved functional ability.  Baseline: 30% disability  Goal status: INITIAL  6.  Patient will demonstrate 5/5 R shoulder strength to return to exercise program   Baseline: see  above Goal status: INITIAL   7. Patient will demonstrate improved grip strength to 20lbs to grip steering wheel and lift objects.  Baseline: 5 lbs compared to 20lbs non-dominant hand Goal status: INITIAL   PLAN: PT FREQUENCY: 1-2x/week  PT DURATION: 6 weeks  PLANNED INTERVENTIONS: Therapeutic exercises, Therapeutic activity, Neuromuscular re-education, Balance training, Gait training, Patient/Family education, Joint mobilization, Dry Needling, Electrical stimulation, Spinal mobilization, Cryotherapy, Moist heat, Traction, Ultrasound, and Manual therapy  PLAN FOR NEXT SESSION: review HEP, manual therapy/modalities/DN to decrease muscle spasm and pain   Jena Gauss, PT, DPT  12/09/2021, 11:40 AM

## 2021-12-09 ENCOUNTER — Encounter: Payer: Self-pay | Admitting: Physical Therapy

## 2021-12-09 ENCOUNTER — Ambulatory Visit: Payer: Medicare HMO | Attending: Physician Assistant | Admitting: Physical Therapy

## 2021-12-09 ENCOUNTER — Telehealth: Payer: Self-pay | Admitting: Physician Assistant

## 2021-12-09 DIAGNOSIS — M25511 Pain in right shoulder: Secondary | ICD-10-CM | POA: Insufficient documentation

## 2021-12-09 DIAGNOSIS — R29898 Other symptoms and signs involving the musculoskeletal system: Secondary | ICD-10-CM | POA: Insufficient documentation

## 2021-12-09 DIAGNOSIS — R252 Cramp and spasm: Secondary | ICD-10-CM | POA: Insufficient documentation

## 2021-12-09 DIAGNOSIS — M6281 Muscle weakness (generalized): Secondary | ICD-10-CM | POA: Insufficient documentation

## 2021-12-09 DIAGNOSIS — M25611 Stiffness of right shoulder, not elsewhere classified: Secondary | ICD-10-CM | POA: Diagnosis present

## 2021-12-09 DIAGNOSIS — M542 Cervicalgia: Secondary | ICD-10-CM | POA: Insufficient documentation

## 2021-12-09 DIAGNOSIS — M7501 Adhesive capsulitis of right shoulder: Secondary | ICD-10-CM | POA: Insufficient documentation

## 2021-12-09 DIAGNOSIS — G8929 Other chronic pain: Secondary | ICD-10-CM | POA: Diagnosis present

## 2021-12-09 NOTE — Telephone Encounter (Signed)
Refill on muscle relaxors

## 2021-12-10 ENCOUNTER — Other Ambulatory Visit: Payer: Self-pay

## 2021-12-10 ENCOUNTER — Other Ambulatory Visit: Payer: Self-pay | Admitting: Physician Assistant

## 2021-12-10 MED ORDER — METHOCARBAMOL 500 MG PO TABS: 500.0000 mg | ORAL_TABLET | Freq: Four times a day (QID) | ORAL | 0 refills | Status: DC | PRN

## 2021-12-11 ENCOUNTER — Encounter: Payer: Self-pay | Admitting: Physical Therapy

## 2021-12-11 ENCOUNTER — Ambulatory Visit: Payer: Medicare HMO | Admitting: Physical Therapy

## 2021-12-11 DIAGNOSIS — M542 Cervicalgia: Secondary | ICD-10-CM

## 2021-12-11 DIAGNOSIS — R252 Cramp and spasm: Secondary | ICD-10-CM

## 2021-12-11 DIAGNOSIS — M6281 Muscle weakness (generalized): Secondary | ICD-10-CM

## 2021-12-11 DIAGNOSIS — M25511 Pain in right shoulder: Secondary | ICD-10-CM

## 2021-12-11 NOTE — Therapy (Signed)
OUTPATIENT PHYSICAL THERAPY TREATMENT NOTE  Patient Name: Lauren Frye MRN: [id number] DOB:1951-04-25, 71 y.o., female Today's Date: 12/11/2021   PT End of Session - 12/11/21 1019     Visit Number 2    Number of Visits 12    Date for PT Re-Evaluation 01/20/22    Authorization Type [employer] MCR    PT Start Time 1017    PT Stop Time 1110    PT Time Calculation (min) 53 min    Activity Tolerance Patient limited by pain    Behavior During Therapy [facility] for tasks assessed/performed              Past Medical History:  Diagnosis Date   Arthritis    Depression    denies   Headache    migraines   Past Surgical History:  Procedure Laterality Date   ABDOMINAL HYSTERECTOMY     ANKLE FUSION     BOWEL RESECTION     KNEE ARTHROSCOPY     KNEE SURGERY Bilateral    LEFT ARTHROCOPY  X3    ,  RT X1   TONSILLECTOMY     TOTAL KNEE ARTHROPLASTY Left 10/07/2015   TOTAL KNEE ARTHROPLASTY Left 10/07/2015   Procedure: TOTAL KNEE ARTHROPLASTY;  Surgeon: Valeria Batman, MD;  Location: [facility] OR;  Service: Orthopedics;  Laterality: Left;   TOTAL KNEE ARTHROPLASTY Right 01/03/2018   TOTAL KNEE ARTHROPLASTY Right 01/03/2018   Procedure: RIGHT TOTAL KNEE ARTHROPLASTY;  Surgeon: Valeria Batman, MD;  Location: [facility] OR;  Service: Orthopedics;  Laterality: Right;   Patient Active Problem List   Diagnosis Date Noted   Pain in right hand 01/01/2021   Adhesive capsulitis of right shoulder 03/13/2020   Impingement syndrome of right shoulder 01/24/2020   S/P total knee arthroplasty, right 01/08/2018   Primary localized osteoarthritis of right knee 01/03/2018   Spinal stenosis of lumbar region 05/30/2017   Postoperative anemia due to acute blood loss 10/11/2015   Arthritis 10/11/2015   Constipation 10/11/2015   Headache 10/11/2015   Osteoarthritis of left knee 10/07/2015   Obesity 10/07/2015   S/P total knee replacement using cement 10/07/2015    PCP: Kerin Salen, PA-C  REFERRING  PROVIDER: Persons, [place], [place]  REFERRING DIAG: M54.2 (ICD-10-CM) - Neck pain, acute M75.01 (ICD-10-CM) - Adhesive capsulitis of right shoulder  THERAPY DIAG:  Cervicalgia  Acute pain of right shoulder  Muscle weakness (generalized)  Cramp and spasm  Rationale for Evaluation and Treatment Rehabilitation  ONSET DATE: May 2023  SUBJECTIVE:  SUBJECTIVE STATEMENT: Pt. Reports she got a refill of muscle relaxer but it was a fiasco.  Still having a lot of pain in upper shoulder down upper arm to elbow.  Has been doing HEP, thinks overdid chin tucks.      PERTINENT HISTORY:  From MD notes 11/27/2021 "She comes in today complaining of neck pain and right shoulder pain.  She she does have a history of adhesive capsulitis in the right shoulder which required a manipulation under anesthesia.  She says she was lifting weights in the gym about a week ago.  Following that she had a lot of spasm and pain in her neck.  She also had pain in her shoulder running down her arm.  Denies any paresthesias.  Does feel like her shoulder is a little weak."  Given injection in R shoulder and prescribed meloxicam and muscle relaxer.   PAIN:  Are you having pain? Yes: NPRS scale: 4/10 Pain location: neck down to R arm Pain description: worst 3/10 with muscle relaxer last 3 days. Aggravating factors: difficulty sleeping, driving Relieving factors: muscle relaxor  PRECAUTIONS: None  WEIGHT BEARING RESTRICTIONS No  PLOF: Independent  PATIENT GOALS "I want my strength back in my arm."  OBJECTIVE:   DIAGNOSTIC FINDINGS:  XR Cervical Spine 2 or 3 views Result Date: 11/27/2021 2 views of the cervical spine today demonstrate some loss of the normal lordotic curve.  Some degenerative changes at C4-5 with anterior  osteophyte formation.  No listhesis.  No acute fracture.   PATIENT SURVEYS:  NDI 22% impairment Quick Dash 30% impairment   SENSATION: WFL but reports tingling into R hand  POSTURE: rounded shoulders and forward head  PALPATION: Tenderness and trigger points R UT, R levator, cervical paraspinals, decreased cervical mobility, elevated R 1st rib with tenderness in scalenes.  Increased neural tension in RUE.    CERVICAL ROM:   Active ROM A/PROM (deg) eval  Flexion 40  Extension 55  Right lateral flexion 22  Left lateral flexion 35  Right rotation 45  Left rotation 70   (Blank rows = not tested)  UPPER EXTREMITY ROM:  Active ROM Right eval Left eval  Shoulder flexion 105 150  Shoulder extension    Shoulder abduction 95 140   (Blank rows = not tested)  UPPER EXTREMITY MMT:  MMT Right* eval Left eval  Shoulder flexion 3-/5 5  Shoulder extension    Shoulder abduction 3-/5 5  Shoulder adduction    Shoulder extension    Shoulder internal rotation 3/5 5  Shoulder external rotation 5/5 5  Elbow flexion 3/5 5  Elbow extension 3/5 5  Wrist flexion    Wrist extension    Grip strength 5 lbs  20 lbs   (Blank rows = not tested) *dominant hand  CERVICAL SPECIAL TESTS:  Spurling's test: Negative   TODAY'S TREATMENT:  12/11/2021 Therapeutic Exercise: to improve strength and mobility.  Demo, verbal and tactile cues throughout for technique. UBE L1 x  4 min (63f/1b) - increased arm pain with retro Threading/nerve glides to decrease shoulder pain.  Rows YTB 2 x 10  Manual Therapy: to decrease muscle spasm and pain and improve mobility STM/TPR to R UT/levators scapulae/anterior scalenes, cervical paraspinals, skilled palpation and monitoring with dry needling.  Trigger Point Dry-Needling  Treatment instructions: Expect mild to moderate muscle soreness. S/S of pneumothorax if dry needled over a lung field, and to seek immediate medical attention should they occur. Patient  verbalized understanding of these instructions and  education.  Patient Consent Given: Yes Education handout provided: Yes Muscles treated: R UT Electrical stimulation performed: No Parameters: N/A Treatment response/outcome: Twitch Response Elicited and Palpable Increase in Muscle Length MHP x 10 min to cervical region in sitting.   12/09/2021 Self Care: education on findings, POC, heat v. Cold, initial HEP. MHP to neck in supine x 10 min  PATIENT EDUCATION:  Education details: see self care Person educated: Patient Education method: Explanation, Demonstration, Verbal cues, and Handouts Education comprehension: verbalized understanding   HOME EXERCISE PROGRAM: Access Code: [email or web address]  ASSESSMENT:  CLINICAL IMPRESSION: Benson SettingSusan J Going demonstrates continued severe trigger points in R UT, levator and anterior scalenes causing radicular symptoms into R arm.  Poor tolerance to exercises today, trialed dry needling (she has had DN prior in hip so familiar) but also very poor tolerance, did note some improvement in R UT spasm.  Focused on gentle STM/TPR remaining session, still had difficulty tolerating, especially tender over anterior scalenes.  Recommended continuing gentle shoulder isometrics and cervical AROM.  She would benefit from continued skilled therapy.     OBJECTIVE IMPAIRMENTS decreased ROM, decreased strength, hypomobility, increased fascial restrictions, increased muscle spasms, impaired flexibility, impaired sensation, impaired UE functional use, postural dysfunction, and pain.   ACTIVITY LIMITATIONS carrying, lifting, bending, sleeping, dressing, and reach over head  PARTICIPATION LIMITATIONS: cleaning, laundry, driving, shopping, community activity, and yard work  PERSONAL FACTORS 1 comorbidity: history of R adhesive capsulitis  are also affecting patient's functional outcome.    GOALS: Goals reviewed with patient? Yes  SHORT TERM GOALS: Target date: 12/23/2021    Patient will be independent with initial HEP.  Baseline: given Goal status: IN PROGRESS   LONG TERM GOALS: Target date: 01/20/2022   Patient will be independent with advanced/ongoing HEP to improve outcomes and carryover.  Baseline: needs update Goal status: IN PROGRESS  2.  Patient will report 75% improvement in neck/RUE pain to improve QOL.  Baseline: 3/10 but increasing Goal status: IN PROGRESS  3.  Patient will demonstrate full pain free cervical ROM for safety with driving.  Baseline: see above Goal status: IN PROGRESS  4.  Patient will WNL R shoulder AROM without pain to perform ADLs and yard work. Baseline: see objective, painful and guarded movements.  Goal status: IN PROGRESS  5.  Patient will report <20% impairment on DASH to demonstrate improved functional ability.  Baseline: 30% disability  Goal status: IN PROGRESS  6.  Patient will demonstrate 5/5 R shoulder strength to return to exercise program   Baseline: see above Goal status: IN PROGRESS   7. Patient will demonstrate improved grip strength to 20lbs to grip steering wheel and lift objects.  Baseline: 5 lbs compared to 20lbs non-dominant hand Goal status: IN PROGRESS   PLAN: PT FREQUENCY: 1-2x/week  PT DURATION: 6 weeks  PLANNED INTERVENTIONS: Therapeutic exercises, Therapeutic activity, Neuromuscular re-education, Balance training, Gait training, Patient/Family education, Joint mobilization, Dry Needling, Electrical stimulation, Spinal mobilization, Cryotherapy, Moist heat, Traction, Ultrasound, and Manual therapy  PLAN FOR NEXT SESSION: continue  manual therapy/modalities/DN to decrease muscle spasm and pain   Jena GaussElizabeth J Kenya Kook, PT, DPT  12/11/2021, 12:01 PM

## 2021-12-14 ENCOUNTER — Ambulatory Visit: Payer: Medicare HMO | Admitting: Physical Therapy

## 2021-12-14 ENCOUNTER — Encounter: Payer: Self-pay | Admitting: Physical Therapy

## 2021-12-14 DIAGNOSIS — M25511 Pain in right shoulder: Secondary | ICD-10-CM

## 2021-12-14 DIAGNOSIS — M542 Cervicalgia: Secondary | ICD-10-CM | POA: Diagnosis not present

## 2021-12-14 DIAGNOSIS — M6281 Muscle weakness (generalized): Secondary | ICD-10-CM

## 2021-12-14 DIAGNOSIS — R252 Cramp and spasm: Secondary | ICD-10-CM

## 2021-12-14 NOTE — Therapy (Signed)
OUTPATIENT PHYSICAL THERAPY TREATMENT NOTE  Patient Name: Lauren Frye MRN: [id number] DOB:02-23-1951, 71 y.o., female Today's Date: 12/14/2021   PT End of Session - 12/14/21 0817     Visit Number 3    Number of Visits 12    Date for PT Re-Evaluation 01/20/22    Authorization Type [employer] MCR    PT Start Time 0805    PT Stop Time 0857    PT Time Calculation (min) 52 min    Activity Tolerance Patient limited by pain    Behavior During Therapy [facility] for tasks assessed/performed              Past Medical History:  Diagnosis Date   Arthritis    Depression    denies   Headache    migraines   Past Surgical History:  Procedure Laterality Date   ABDOMINAL HYSTERECTOMY     ANKLE FUSION     BOWEL RESECTION     KNEE ARTHROSCOPY     KNEE SURGERY Bilateral    LEFT ARTHROCOPY  X3    ,  RT X1   TONSILLECTOMY     TOTAL KNEE ARTHROPLASTY Left 10/07/2015   TOTAL KNEE ARTHROPLASTY Left 10/07/2015   Procedure: TOTAL KNEE ARTHROPLASTY;  Surgeon: Garald Balding, MD;  Location: [place];  Service: Orthopedics;  Laterality: Left;   TOTAL KNEE ARTHROPLASTY Right 01/03/2018   TOTAL KNEE ARTHROPLASTY Right 01/03/2018   Procedure: RIGHT TOTAL KNEE ARTHROPLASTY;  Surgeon: Garald Balding, MD;  Location: [place];  Service: Orthopedics;  Laterality: Right;   Patient Active Problem List   Diagnosis Date Noted   Pain in right hand 01/01/2021   Adhesive capsulitis of right shoulder 03/13/2020   Impingement syndrome of right shoulder 01/24/2020   S/P total knee arthroplasty, right 01/08/2018   Primary localized osteoarthritis of right knee 01/03/2018   Spinal stenosis of lumbar region 05/30/2017   Postoperative anemia due to acute blood loss 10/11/2015   Arthritis 10/11/2015   Constipation 10/11/2015   Headache 10/11/2015   Osteoarthritis of left knee 10/07/2015   Obesity 10/07/2015   S/P total knee replacement using cement 10/07/2015    PCP: Jenel Lucks, PA-C  REFERRING  PROVIDER: Persons, [place], PA  REFERRING DIAG: M54.2 (ICD-10-CM) - Neck pain, acute M75.01 (ICD-10-CM) - Adhesive capsulitis of right shoulder  THERAPY DIAG:  Cervicalgia  Acute pain of right shoulder  Muscle weakness (generalized)  Cramp and spasm  Rationale for Evaluation and Treatment Rehabilitation  ONSET DATE: May 2023  SUBJECTIVE:  SUBJECTIVE STATEMENT: Pt. Reports more pain in her shoulder now, as well as her neck and elbow.  She had trouble driving in today because her R hand  doesn't want to stay on steering wheel.       PERTINENT HISTORY:  From MD notes 11/27/2021 "She comes in today complaining of neck pain and right shoulder pain.  She she does have a history of adhesive capsulitis in the right shoulder which required a manipulation under anesthesia.  She says she was lifting weights in the gym about a week ago.  Following that she had a lot of spasm and pain in her neck.  She also had pain in her shoulder running down her arm.  Denies any paresthesias.  Does feel like her shoulder is a little weak."  Given injection in R shoulder and prescribed meloxicam and muscle relaxer.   PAIN:  Are you having pain? Yes: NPRS scale: 4-5/10 Pain location: neck down to R arm Pain description: worst 3/10 with muscle relaxer last 3 days. Aggravating factors: difficulty sleeping, driving Relieving factors: muscle relaxor  PRECAUTIONS: None  WEIGHT BEARING RESTRICTIONS No  PLOF: Independent  PATIENT GOALS "I want my strength back in my arm."  OBJECTIVE:   DIAGNOSTIC FINDINGS:  XR Cervical Spine 2 or 3 views Result Date: 11/27/2021 2 views of the cervical spine today demonstrate some loss of the normal lordotic curve.  Some degenerative changes at C4-5 with anterior osteophyte  formation.  No listhesis.  No acute fracture.   PATIENT SURVEYS:  NDI 22% impairment Quick Dash 30% impairment   SENSATION: WFL but reports tingling into R hand  POSTURE: rounded shoulders and forward head  PALPATION: Tenderness and trigger points R UT, R levator, cervical paraspinals, decreased cervical mobility, elevated R 1st rib with tenderness in scalenes.  Increased neural tension in RUE.    CERVICAL ROM:   Active ROM A/PROM (deg) eval  Flexion 40  Extension 55  Right lateral flexion 22  Left lateral flexion 35  Right rotation 45  Left rotation 70   (Blank rows = not tested)  UPPER EXTREMITY ROM:  Active ROM Right eval Left eval  Shoulder flexion 105 150  Shoulder extension    Shoulder abduction 95 140   (Blank rows = not tested)  UPPER EXTREMITY MMT:  MMT Right* eval Left eval  Shoulder flexion 3-/5 5  Shoulder extension    Shoulder abduction 3-/5 5  Shoulder adduction    Shoulder extension    Shoulder internal rotation 3/5 5  Shoulder external rotation 5/5 5  Elbow flexion 3/5 5  Elbow extension 3/5 5  Wrist flexion    Wrist extension    Grip strength 5 lbs  20 lbs   (Blank rows = not tested) *dominant hand  CERVICAL SPECIAL TESTS:  Spurling's test: Negative   TODAY'S TREATMENT:  12/14/2021 Therapeutic Exercise: to improve strength and mobility.  Demo, verbal and tactile cues throughout for technique. Shoulder rolls retro Open book at wall LUE only Self snags with pillow case flexion and rotation.  Scalene stretch - demo only Traction - cervical traction with device, 18lbs x 5 min hold x 2 Manual Therapy: to decrease muscle spasm and pain and improve mobility Cervical PA mobs grade 1-2 (tender C5-6), NAGs into rotation, STM/TPR erector spinae R, R levator scapulae, R UT, R scalenes, myofascial release MHP x 10 min cervical in supine.   12/11/2021 Therapeutic Exercise: to improve strength and mobility.  Demo, verbal and tactile cues  throughout  for technique. UBE L1 x  4 min (70f/1b) - increased arm pain with retro Threading/nerve glides to decrease shoulder pain.  Rows YTB 2 x 10  Manual Therapy: to decrease muscle spasm and pain and improve mobility STM/TPR to R UT/levators scapulae/anterior scalenes, cervical paraspinals, skilled palpation and monitoring with dry needling.  Trigger Point Dry-Needling  Treatment instructions: Expect mild to moderate muscle soreness. S/S of pneumothorax if dry needled over a lung field, and to seek immediate medical attention should they occur. Patient verbalized understanding of these instructions and education.  Patient Consent Given: Yes Education handout provided: Yes Muscles treated: R UT Electrical stimulation performed: No Parameters: N/A Treatment response/outcome: Twitch Response Elicited and Palpable Increase in Muscle Length  12/09/2021 Self Care: education on findings, POC, heat v. Cold, initial HEP. MHP to neck in supine x 10 min  PATIENT EDUCATION:  Education details: HEP update, discussed rehab if RTC tear present. Person educated: Patient Education method: Explanation, Demonstration, Verbal cues, and Handouts Education comprehension: verbalized understanding   HOME EXERCISE PROGRAM: Access Code: [email or web address]  ASSESSMENT:  CLINICAL IMPRESSION: Lauren Frye continues to report RUE/cervical pain and demonstrates poor tolerance to exercise.  Trialed traction today for gentle stretch followed by manual therapy - noted improved extensibility but still very tender at trigger points especially anterior scalenes.  MHP at end of session for muscle relaxation.   OBJECTIVE IMPAIRMENTS decreased ROM, decreased strength, hypomobility, increased fascial restrictions, increased muscle spasms, impaired flexibility, impaired sensation, impaired UE functional use, postural dysfunction, and pain.   ACTIVITY LIMITATIONS carrying, lifting, bending, sleeping, dressing, and reach over  head  PARTICIPATION LIMITATIONS: cleaning, laundry, driving, shopping, community activity, and yard work  PERSONAL FACTORS 1 comorbidity: history of R adhesive capsulitis  are also affecting patient's functional outcome.    GOALS: Goals reviewed with patient? Yes  SHORT TERM GOALS: Target date: 12/23/2021   Patient will be independent with initial HEP.  Baseline: given Goal status: IN PROGRESS   LONG TERM GOALS: Target date: 01/20/2022   Patient will be independent with advanced/ongoing HEP to improve outcomes and carryover.  Baseline: needs update Goal status: IN PROGRESS  2.  Patient will report 75% improvement in neck/RUE pain to improve QOL.  Baseline: 3/10 but increasing Goal status: IN PROGRESS  3.  Patient will demonstrate full pain free cervical ROM for safety with driving.  Baseline: see above Goal status: IN PROGRESS  4.  Patient will WNL R shoulder AROM without pain to perform ADLs and yard work. Baseline: see objective, painful and guarded movements.  Goal status: IN PROGRESS  5.  Patient will report <20% impairment on DASH to demonstrate improved functional ability.  Baseline: 30% disability  Goal status: IN PROGRESS  6.  Patient will demonstrate 5/5 R shoulder strength to return to exercise program   Baseline: see above Goal status: IN PROGRESS   7. Patient will demonstrate improved grip strength to 20lbs to grip steering wheel and lift objects.  Baseline: 5 lbs compared to 20lbs non-dominant hand Goal status: IN PROGRESS   PLAN: PT FREQUENCY: 1-2x/week  PT DURATION: 6 weeks  PLANNED INTERVENTIONS: Therapeutic exercises, Therapeutic activity, Neuromuscular re-education, Balance training, Gait training, Patient/Family education, Joint mobilization, Dry Needling, Electrical stimulation, Spinal mobilization, Cryotherapy, Moist heat, Traction, Ultrasound, and Manual therapy  PLAN FOR NEXT SESSION: continue  manual therapy/modalities/DN to decrease muscle  spasm and pain   Rennie Natter, PT, DPT  12/14/2021, 9:01 AM

## 2021-12-16 ENCOUNTER — Ambulatory Visit: Payer: Medicare HMO

## 2021-12-16 DIAGNOSIS — M6281 Muscle weakness (generalized): Secondary | ICD-10-CM

## 2021-12-16 DIAGNOSIS — G8929 Other chronic pain: Secondary | ICD-10-CM

## 2021-12-16 DIAGNOSIS — M542 Cervicalgia: Secondary | ICD-10-CM | POA: Diagnosis not present

## 2021-12-16 DIAGNOSIS — R29898 Other symptoms and signs involving the musculoskeletal system: Secondary | ICD-10-CM

## 2021-12-16 DIAGNOSIS — M25611 Stiffness of right shoulder, not elsewhere classified: Secondary | ICD-10-CM

## 2021-12-16 DIAGNOSIS — M25511 Pain in right shoulder: Secondary | ICD-10-CM

## 2021-12-16 DIAGNOSIS — R252 Cramp and spasm: Secondary | ICD-10-CM

## 2021-12-16 NOTE — Therapy (Signed)
OUTPATIENT PHYSICAL THERAPY TREATMENT NOTE  Patient Name: Lauren Frye MRN: [id number] DOB:14-Aug-1950, 71 y.o., female Today's Date: 12/16/2021   PT End of Session - 12/16/21 1150     Visit Number 4    Number of Visits 12    Date for PT Re-Evaluation 01/20/22    Authorization Type [employer] MCR    PT Start Time 1101    PT Stop Time 1156    PT Time Calculation (min) 55 min    Activity Tolerance Patient limited by pain;Patient tolerated treatment well    Behavior During Therapy [facility] for tasks assessed/performed               Past Medical History:  Diagnosis Date   Arthritis    Depression    denies   Headache    migraines   Past Surgical History:  Procedure Laterality Date   ABDOMINAL HYSTERECTOMY     ANKLE FUSION     BOWEL RESECTION     KNEE ARTHROSCOPY     KNEE SURGERY Bilateral    LEFT ARTHROCOPY  X3    ,  RT X1   TONSILLECTOMY     TOTAL KNEE ARTHROPLASTY Left 10/07/2015   TOTAL KNEE ARTHROPLASTY Left 10/07/2015   Procedure: TOTAL KNEE ARTHROPLASTY;  Surgeon: Garald Balding, MD;  Location: [place];  Service: Orthopedics;  Laterality: Left;   TOTAL KNEE ARTHROPLASTY Right 01/03/2018   TOTAL KNEE ARTHROPLASTY Right 01/03/2018   Procedure: RIGHT TOTAL KNEE ARTHROPLASTY;  Surgeon: Garald Balding, MD;  Location: [place];  Service: Orthopedics;  Laterality: Right;   Patient Active Problem List   Diagnosis Date Noted   Pain in right hand 01/01/2021   Adhesive capsulitis of right shoulder 03/13/2020   Impingement syndrome of right shoulder 01/24/2020   S/P total knee arthroplasty, right 01/08/2018   Primary localized osteoarthritis of right knee 01/03/2018   Spinal stenosis of lumbar region 05/30/2017   Postoperative anemia due to acute blood loss 10/11/2015   Arthritis 10/11/2015   Constipation 10/11/2015   Headache 10/11/2015   Osteoarthritis of left knee 10/07/2015   Obesity 10/07/2015   S/P total knee replacement using cement 10/07/2015    PCP: Jenel Lucks, PA-C  REFERRING PROVIDER: Persons, Bevely Palmer, PA  REFERRING DIAG: M54.2 (ICD-10-CM) - Neck pain, acute M75.01 (ICD-10-CM) - Adhesive capsulitis of right shoulder  THERAPY DIAG:  Cervicalgia  Acute pain of right shoulder  Muscle weakness (generalized)  Cramp and spasm  Stiffness of right shoulder, not elsewhere classified  Other symptoms and signs involving the musculoskeletal system  Chronic right shoulder pain  Rationale for Evaluation and Treatment Rehabilitation  ONSET DATE: May 2023  SUBJECTIVE:  SUBJECTIVE STATEMENT: Pt reports nothing has been helping so far, after last visit she had a headache.   PERTINENT HISTORY:  From MD notes 11/27/2021 "She comes in today complaining of neck pain and right shoulder pain.  She she does have a history of adhesive capsulitis in the right shoulder which required a manipulation under anesthesia.  She says she was lifting weights in the gym about a week ago.  Following that she had a lot of spasm and pain in her neck.  She also had pain in her shoulder running down her arm.  Denies any paresthesias.  Does feel like her shoulder is a little weak."  Given injection in R shoulder and prescribed meloxicam and muscle relaxer.   PAIN:  Are you having pain? Yes: NPRS scale: 4-5/10 Pain location: R shoulder to arm Pain description: worst 3/10 with muscle relaxer last 3 days. Aggravating factors: difficulty sleeping, driving Relieving factors: muscle relaxor  PRECAUTIONS: None  WEIGHT BEARING RESTRICTIONS No  PLOF: Independent  PATIENT GOALS "I want my strength back in my arm."  OBJECTIVE:   DIAGNOSTIC FINDINGS:  XR Cervical Spine 2 or 3 views Result Date: 11/27/2021 2 views of the cervical spine today demonstrate some  loss of the normal lordotic curve.  Some degenerative changes at C4-5 with anterior osteophyte formation.  No listhesis.  No acute fracture.   PATIENT SURVEYS:  NDI 22% impairment Quick Dash 30% impairment   SENSATION: WFL but reports tingling into R hand  POSTURE: rounded shoulders and forward head  PALPATION: Tenderness and trigger points R UT, R levator, cervical paraspinals, decreased cervical mobility, elevated R 1st rib with tenderness in scalenes.  Increased neural tension in RUE.    CERVICAL ROM:   Active ROM A/PROM (deg) eval  Flexion 40  Extension 55  Right lateral flexion 22  Left lateral flexion 35  Right rotation 45  Left rotation 70   (Blank rows = not tested)  UPPER EXTREMITY ROM:  Active ROM Right eval Left eval  Shoulder flexion 105 150  Shoulder extension    Shoulder abduction 95 140   (Blank rows = not tested)  UPPER EXTREMITY MMT:  MMT Right* eval Left eval  Shoulder flexion 3-/5 5  Shoulder extension    Shoulder abduction 3-/5 5  Shoulder adduction    Shoulder extension    Shoulder internal rotation 3/5 5  Shoulder external rotation 5/5 5  Elbow flexion 3/5 5  Elbow extension 3/5 5  Wrist flexion    Wrist extension    Grip strength 5 lbs  20 lbs   (Blank rows = not tested) *dominant hand  CERVICAL SPECIAL TESTS:  Spurling's test: Negative   TODAY'S TREATMENT:  12/16/21 Therapeutic Exercises: UBE L1 4 min (2 fwd/2back) UT stretch x 30 sec Levator stretch x 30 sec R scalenes stretch x 30 sec Shoulder squeeze 10x3"  Manual Therapy: STM to R UT, levator, scalenes, cervical paraspinals  Estim + MHP to B cervical region 10 min  12/14/2021 Therapeutic Exercise: to improve strength and mobility.  Demo, verbal and tactile cues throughout for technique. Shoulder rolls retro Open book at wall LUE only Self snags with pillow case flexion and rotation.  Scalene stretch - demo only Traction - cervical traction with device, 18lbs x 5  min hold x 2 Manual Therapy: to decrease muscle spasm and pain and improve mobility Cervical PA mobs grade 1-2 (tender C5-6), NAGs into rotation, STM/TPR erector spinae R, R levator scapulae, R UT, R scalenes,  myofascial release MHP x 10 min cervical in supine.   12/11/2021 Therapeutic Exercise: to improve strength and mobility.  Demo, verbal and tactile cues throughout for technique. UBE L1 x  4 min (6f/1b) - increased arm pain with retro Threading/nerve glides to decrease shoulder pain.  Rows YTB 2 x 10  Manual Therapy: to decrease muscle spasm and pain and improve mobility STM/TPR to R UT/levators scapulae/anterior scalenes, cervical paraspinals, skilled palpation and monitoring with dry needling.  Trigger Point Dry-Needling  Treatment instructions: Expect mild to moderate muscle soreness. S/S of pneumothorax if dry needled over a lung field, and to seek immediate medical attention should they occur. Patient verbalized understanding of these instructions and education.  Patient Consent Given: Yes Education handout provided: Yes Muscles treated: R UT Electrical stimulation performed: No Parameters: N/A Treatment response/outcome: Twitch Response Elicited and Palpable Increase in Muscle Length    PATIENT EDUCATION:  Education details: HEP update, discussed rehab if RTC tear present. Person educated: Patient Education method: Explanation, Demonstration, Verbal cues, and Handouts Education comprehension: verbalized understanding   HOME EXERCISE PROGRAM: Access Code: [email or web address]  ASSESSMENT:  CLINICAL IMPRESSION: Pt denies making any improvement in R shoulder pain or neck flexibility. She denied any improvement so far from HEP, so we continued working on manual techniques to reduce pain and stiffness in muscles. Pt was very tender along the UT, levator, and scalene area. Ended session with estim and moist heat to reduce pain and neck stiffness post exercise.  OBJECTIVE IMPAIRMENTS  decreased ROM, decreased strength, hypomobility, increased fascial restrictions, increased muscle spasms, impaired flexibility, impaired sensation, impaired UE functional use, postural dysfunction, and pain.   ACTIVITY LIMITATIONS carrying, lifting, bending, sleeping, dressing, and reach over head  PARTICIPATION LIMITATIONS: cleaning, laundry, driving, shopping, community activity, and yard work  PERSONAL FACTORS 1 comorbidity: history of R adhesive capsulitis  are also affecting patient's functional outcome.    GOALS: Goals reviewed with patient? Yes  SHORT TERM GOALS: Target date: 12/23/2021   Patient will be independent with initial HEP.  Baseline: given Goal status: IN PROGRESS   LONG TERM GOALS: Target date: 01/20/2022   Patient will be independent with advanced/ongoing HEP to improve outcomes and carryover.  Baseline: needs update Goal status: IN PROGRESS  2.  Patient will report 75% improvement in neck/RUE pain to improve QOL.  Baseline: 3/10 but increasing Goal status: IN PROGRESS  3.  Patient will demonstrate full pain free cervical ROM for safety with driving.  Baseline: see above Goal status: IN PROGRESS  4.  Patient will WNL R shoulder AROM without pain to perform ADLs and yard work. Baseline: see objective, painful and guarded movements.  Goal status: IN PROGRESS  5.  Patient will report <20% impairment on DASH to demonstrate improved functional ability.  Baseline: 30% disability  Goal status: IN PROGRESS  6.  Patient will demonstrate 5/5 R shoulder strength to return to exercise program   Baseline: see above Goal status: IN PROGRESS   7. Patient will demonstrate improved grip strength to 20lbs to grip steering wheel and lift objects.  Baseline: 5 lbs compared to 20lbs non-dominant hand Goal status: IN PROGRESS   PLAN: PT FREQUENCY: 1-2x/week  PT DURATION: 6 weeks  PLANNED INTERVENTIONS: Therapeutic exercises, Therapeutic activity, Neuromuscular  re-education, Balance training, Gait training, Patient/Family education, Joint mobilization, Dry Needling, Electrical stimulation, Spinal mobilization, Cryotherapy, Moist heat, Traction, Ultrasound, and Manual therapy  PLAN FOR NEXT SESSION: assess response to estim; continue  manual therapy/modalities/DN to decrease muscle spasm and  pain   Artist Pais, PTA 12/16/2021, 12:03 PM

## 2021-12-22 ENCOUNTER — Encounter: Payer: Self-pay | Admitting: Physical Therapy

## 2021-12-22 ENCOUNTER — Ambulatory Visit: Payer: Medicare HMO | Admitting: Physical Therapy

## 2021-12-22 DIAGNOSIS — R252 Cramp and spasm: Secondary | ICD-10-CM

## 2021-12-22 DIAGNOSIS — M25511 Pain in right shoulder: Secondary | ICD-10-CM

## 2021-12-22 DIAGNOSIS — M6281 Muscle weakness (generalized): Secondary | ICD-10-CM

## 2021-12-22 DIAGNOSIS — M542 Cervicalgia: Secondary | ICD-10-CM

## 2021-12-22 NOTE — Therapy (Signed)
OUTPATIENT PHYSICAL THERAPY TREATMENT NOTE  Patient Name: Lauren Frye MRN: [id number] DOB:1951/02/27, 71 y.o., female Today's Date: 12/22/2021   PT End of Session - 12/22/21 1624     Visit Number 5    Number of Visits 12    Date for PT Re-Evaluation 01/20/22    Authorization Type [employer] MCR    PT Start Time 1620    PT Stop Time 1703    PT Time Calculation (min) 43 min    Activity Tolerance Patient limited by pain;Patient tolerated treatment well    Behavior During Therapy [facility] for tasks assessed/performed               Past Medical History:  Diagnosis Date   Arthritis    Depression    denies   Headache    migraines   Past Surgical History:  Procedure Laterality Date   ABDOMINAL HYSTERECTOMY     ANKLE FUSION     BOWEL RESECTION     KNEE ARTHROSCOPY     KNEE SURGERY Bilateral    LEFT ARTHROCOPY  X3    ,  RT X1   TONSILLECTOMY     TOTAL KNEE ARTHROPLASTY Left 10/07/2015   TOTAL KNEE ARTHROPLASTY Left 10/07/2015   Procedure: TOTAL KNEE ARTHROPLASTY;  Surgeon: Garald Balding, MD;  Location: [place];  Service: Orthopedics;  Laterality: Left;   TOTAL KNEE ARTHROPLASTY Right 01/03/2018   TOTAL KNEE ARTHROPLASTY Right 01/03/2018   Procedure: RIGHT TOTAL KNEE ARTHROPLASTY;  Surgeon: Garald Balding, MD;  Location: [place];  Service: Orthopedics;  Laterality: Right;   Patient Active Problem List   Diagnosis Date Noted   Pain in right hand 01/01/2021   Adhesive capsulitis of right shoulder 03/13/2020   Impingement syndrome of right shoulder 01/24/2020   S/P total knee arthroplasty, right 01/08/2018   Primary localized osteoarthritis of right knee 01/03/2018   Spinal stenosis of lumbar region 05/30/2017   Postoperative anemia due to acute blood loss 10/11/2015   Arthritis 10/11/2015   Constipation 10/11/2015   Headache 10/11/2015   Osteoarthritis of left knee 10/07/2015   Obesity 10/07/2015   S/P total knee replacement using cement 10/07/2015    PCP: Jenel Lucks, PA-C  REFERRING PROVIDER: Persons, Bevely Palmer, PA  REFERRING DIAG: M54.2 (ICD-10-CM) - Neck pain, acute M75.01 (ICD-10-CM) - Adhesive capsulitis of right shoulder  THERAPY DIAG:  Cervicalgia  Acute pain of right shoulder  Muscle weakness (generalized)  Cramp and spasm  Rationale for Evaluation and Treatment Rehabilitation  ONSET DATE: May 2023  SUBJECTIVE:  SUBJECTIVE STATEMENT: Pt. Reports the "machine" last session really helped, reports pain has improved significantly, has a little spasm in her neck today.  She has returned to her exercise class and using 2lb hand weights.    PERTINENT HISTORY:  From MD notes 11/27/2021 "She comes in today complaining of neck pain and right shoulder pain.  She she does have a history of adhesive capsulitis in the right shoulder which required a manipulation under anesthesia.  She says she was lifting weights in the gym about a week ago.  Following that she had a lot of spasm and pain in her neck.  She also had pain in her shoulder running down her arm.  Denies any paresthesias.  Does feel like her shoulder is a little weak."  Given injection in R shoulder and prescribed meloxicam and muscle relaxer.   PAIN:  Are you having pain? Yes: NPRS scale: 4/10 Pain location: R side of neck Pain description: annoying Aggravating factors: difficulty sleeping, driving Relieving factors: muscle relaxor  PRECAUTIONS: None  WEIGHT BEARING RESTRICTIONS No  PLOF: Independent  PATIENT GOALS "I want my strength back in my arm."  OBJECTIVE:   DIAGNOSTIC FINDINGS:  XR Cervical Spine 2 or 3 views Result Date: 11/27/2021 2 views of the cervical spine today demonstrate some loss of the normal lordotic curve.  Some degenerative changes at C4-5 with  anterior osteophyte formation.  No listhesis.  No acute fracture.   PATIENT SURVEYS:  NDI 22% impairment Quick Dash 30% impairment   SENSATION: WFL but reports tingling into R hand  POSTURE: rounded shoulders and forward head  PALPATION: Tenderness and trigger points R UT, R levator, cervical paraspinals, decreased cervical mobility, elevated R 1st rib with tenderness in scalenes.  Increased neural tension in RUE.    CERVICAL ROM:   Active ROM A/PROM (deg) eval  Flexion 40  Extension 55  Right lateral flexion 22  Left lateral flexion 35  Right rotation 45  Left rotation 70   (Blank rows = not tested)  UPPER EXTREMITY ROM:  Active ROM Right eval Left eval  Shoulder flexion 105 150  Shoulder extension    Shoulder abduction 95 140   (Blank rows = not tested)  UPPER EXTREMITY MMT:  MMT Right* eval Left eval   Shoulder flexion 3-/5 5 4+  Shoulder extension     Shoulder abduction 3-/5 5 4+  Shoulder adduction     Shoulder extension     Shoulder internal rotation 3/5 5 5   Shoulder external rotation 5/5 5 5   Elbow flexion 3/5 5 5   Elbow extension 3/5 5 5   Wrist flexion     Wrist extension     Grip strength 5 lbs  20 lbs 30 lbs   (Blank rows = not tested) *dominant hand  CERVICAL SPECIAL TESTS:  Spurling's test: Negative   TODAY'S TREATMENT:  12/22/21 Therapeutic Exercise: to improve strength and mobility.   UBE L1 x 6 min (64f3b) Row x 10 YTB Bil shoulder extension x 10 YTB W-arms YTB x 10 Diagonals x 10 bil  Manual Therapy: STM to R UT, levator, scalenes, cervical paraspinals, still extremely tender Electrical Stimulation IFC to bil UT + MHP to B cervical region x 15 min  12/16/21 Therapeutic Exercises: UBE L1 4 min (2 fwd/2back) UT stretch x 30 sec Levator stretch x 30 sec R scalenes stretch x 30 sec Shoulder squeeze 10x3"  Manual Therapy: STM to R UT, levator, scalenes, cervical paraspinals  Estim + MHP to B  cervical region 10  min  12/14/2021 Therapeutic Exercise: to improve strength and mobility.  Demo, verbal and tactile cues throughout for technique. Shoulder rolls retro Open book at wall LUE only Self snags with pillow case flexion and rotation.  Scalene stretch - demo only Traction - cervical traction with device, 18lbs x 5 min hold x 2 Manual Therapy: to decrease muscle spasm and pain and improve mobility Cervical PA mobs grade 1-2 (tender C5-6), NAGs into rotation, STM/TPR erector spinae R, R levator scapulae, R UT, R scalenes, myofascial release MHP x 10 min cervical in supine.    PATIENT EDUCATION:  Education details: continue HEP Person educated: Patient Education method: Consulting civil engineer, Media planner, Verbal cues, and Handouts Education comprehension: verbalized understanding   HOME EXERCISE PROGRAM: Access Code: [email or web address]  ASSESSMENT:  CLINICAL IMPRESSION: Pt is making good progress towards goals.  Her RUE strength has returned, and she has returned to her exercise class, meeting LGT #7 & 6.  She still reports "annoying" spasm in her neck but no longer has arm pain or tingling.  She was able to complete all exercises today without difficulty or pain.  Still extremely tender throughout R UT, LS, and anterior scalenes.  Applied Estim + MHP at end of session to decrease muscle spasm.  Discussed bringing in TENS unit next session so PT could help set up.   OBJECTIVE IMPAIRMENTS decreased ROM, decreased strength, hypomobility, increased fascial restrictions, increased muscle spasms, impaired flexibility, impaired sensation, impaired UE functional use, postural dysfunction, and pain.   ACTIVITY LIMITATIONS carrying, lifting, bending, sleeping, dressing, and reach over head  PARTICIPATION LIMITATIONS: cleaning, laundry, driving, shopping, community activity, and yard work  PERSONAL FACTORS 1 comorbidity: history of R adhesive capsulitis  are also affecting patient's functional outcome.    GOALS: Goals  reviewed with patient? Yes  SHORT TERM GOALS: Target date: 12/23/2021   Patient will be independent with initial HEP.  Baseline: given Goal status: MET   LONG TERM GOALS: Target date: 01/20/2022   Patient will be independent with advanced/ongoing HEP to improve outcomes and carryover.  Baseline: needs update Goal status: IN PROGRESS  2.  Patient will report 75% improvement in neck/RUE pain to improve QOL.  Baseline: 3/10 but increasing Goal status: IN PROGRESS  3.  Patient will demonstrate full pain free cervical ROM for safety with driving.  Baseline: see above Goal status: IN PROGRESS  4.  Patient will WNL R shoulder AROM without pain to perform ADLs and yard work. Baseline: see objective, painful and guarded movements.  Goal status: IN PROGRESS  5.  Patient will report <20% impairment on DASH to demonstrate improved functional ability.  Baseline: 30% disability  Goal status: IN PROGRESS  6.  Patient will demonstrate 5/5 R shoulder strength to return to exercise program   Baseline: see above Goal status: MET   7. Patient will demonstrate improved grip strength to 20lbs to grip steering wheel and lift objects.  Baseline: 5 lbs compared to 20lbs non-dominant hand Goal status: MET   PLAN: PT FREQUENCY: 1-2x/week  PT DURATION: 6 weeks  PLANNED INTERVENTIONS: Therapeutic exercises, Therapeutic activity, Neuromuscular re-education, Balance training, Gait training, Patient/Family education, Joint mobilization, Dry Needling, Electrical stimulation, Spinal mobilization, Cryotherapy, Moist heat, Traction, Ultrasound, and Manual therapy  PLAN FOR NEXT SESSION: set up TENs; continue  manual therapy/modalities/DN to decrease muscle spasm and pain   Rennie Natter, PT, DPT  12/22/2021, 6:08 PM

## 2021-12-28 ENCOUNTER — Ambulatory Visit: Payer: Medicare HMO | Admitting: Physical Therapy

## 2021-12-28 ENCOUNTER — Encounter: Payer: Self-pay | Admitting: Physical Therapy

## 2021-12-28 DIAGNOSIS — M542 Cervicalgia: Secondary | ICD-10-CM

## 2021-12-28 DIAGNOSIS — M6281 Muscle weakness (generalized): Secondary | ICD-10-CM

## 2021-12-28 DIAGNOSIS — R252 Cramp and spasm: Secondary | ICD-10-CM

## 2021-12-28 DIAGNOSIS — M25511 Pain in right shoulder: Secondary | ICD-10-CM

## 2021-12-28 NOTE — Therapy (Addendum)
PHYSICAL THERAPY DISCHARGE SUMMARY  Visits from Start of Care: 6  Current functional level related to goals / functional outcomes: Decreased pain, improved R shoulder and cervical ROM, Quick DASH only 2.5% impairment.    Remaining deficits: Tenderness R neck/anterior shoulder   Education / Equipment: HE  Plan: Patient agrees to discharge.  Patient is being discharged due to meeting the stated rehab goals.  She was placed on 30 day hold on 12/28/21 based on progress and travel, and has not needed to return for skilled therapy during this interval.   Rennie Natter, PT, DPT 2:21 PM 02/18/2022      OUTPATIENT PHYSICAL THERAPY TREATMENT NOTE/Progress Note  Progress Note Reporting Period 12/09/2021 to 12/28/2021  See note below for Objective Data and Assessment of Progress/Goals.     Patient Name: Lauren Frye MRN: [id number] DOB:August 25, 1950, 71 y.o., female Today's Date: 12/28/2021   PT End of Session - 12/28/21 0933     Visit Number 6    Number of Visits 12    Date for PT Re-Evaluation 01/20/22    Authorization Type [employer] MCR    PT Start Time 0930    PT Stop Time 1025    PT Time Calculation (min) 55 min    Activity Tolerance Patient limited by pain;Patient tolerated treatment well    Behavior During Therapy [facility] for tasks assessed/performed               Past Medical History:  Diagnosis Date   Arthritis    Depression    denies   Headache    migraines   Past Surgical History:  Procedure Laterality Date   ABDOMINAL HYSTERECTOMY     ANKLE FUSION     BOWEL RESECTION     KNEE ARTHROSCOPY     KNEE SURGERY Bilateral    LEFT ARTHROCOPY  X3    ,  RT X1   TONSILLECTOMY     TOTAL KNEE ARTHROPLASTY Left 10/07/2015   TOTAL KNEE ARTHROPLASTY Left 10/07/2015   Procedure: TOTAL KNEE ARTHROPLASTY;  Surgeon: Garald Balding, MD;  Location: [place];  Service: Orthopedics;  Laterality: Left;   TOTAL KNEE ARTHROPLASTY Right 01/03/2018   TOTAL KNEE ARTHROPLASTY Right  01/03/2018   Procedure: RIGHT TOTAL KNEE ARTHROPLASTY;  Surgeon: Garald Balding, MD;  Location: [place];  Service: Orthopedics;  Laterality: Right;   Patient Active Problem List   Diagnosis Date Noted   Pain in right hand 01/01/2021   Adhesive capsulitis of right shoulder 03/13/2020   Impingement syndrome of right shoulder 01/24/2020   S/P total knee arthroplasty, right 01/08/2018   Primary localized osteoarthritis of right knee 01/03/2018   Spinal stenosis of lumbar region 05/30/2017   Postoperative anemia due to acute blood loss 10/11/2015   Arthritis 10/11/2015   Constipation 10/11/2015   Headache 10/11/2015   Osteoarthritis of left knee 10/07/2015   Obesity 10/07/2015   S/P total knee replacement using cement 10/07/2015    PCP: Jenel Lucks, PA-C  REFERRING PROVIDER: Persons, Bevely Palmer, PA  REFERRING DIAG: M54.2 (ICD-10-CM) - Neck pain, acute M75.01 (ICD-10-CM) - Adhesive capsulitis of right shoulder  THERAPY DIAG:  Cervicalgia  Acute pain of right shoulder  Muscle weakness (generalized)  Cramp and spasm  Rationale for Evaluation and Treatment Rehabilitation  ONSET DATE: May 2023  SUBJECTIVE:  SUBJECTIVE STATEMENT: Pt. Forgot to bring her TENS.  She would like to stop therapy today.   Reports just has stiffness in her neck and occasional tightness, pain near R pec.  PERTINENT HISTORY:  From MD notes 11/27/2021 "She comes in today complaining of neck pain and right shoulder pain.  She she does have a history of adhesive capsulitis in the right shoulder which required a manipulation under anesthesia.  She says she was lifting weights in the gym about a week ago.  Following that she had a lot of spasm and pain in her neck.  She also had pain in her shoulder  running down her arm.  Denies any paresthesias.  Does feel like her shoulder is a little weak."  Given injection in R shoulder and prescribed meloxicam and muscle relaxer.   PAIN:  Are you having pain? No  PRECAUTIONS: None  WEIGHT BEARING RESTRICTIONS No  PLOF: Independent  PATIENT GOALS "I want my strength back in my arm."  OBJECTIVE:   DIAGNOSTIC FINDINGS:  XR Cervical Spine 2 or 3 views Result Date: 11/27/2021 2 views of the cervical spine today demonstrate some loss of the normal lordotic curve.  Some degenerative changes at C4-5 with anterior osteophyte formation.  No listhesis.  No acute fracture.   PATIENT SURVEYS:  NDI 22% impairment Quick Dash 30% impairment 12/28/21- Quick DASH 2.5% impairment  SENSATION: WFL but reports tingling into R hand  POSTURE: rounded shoulders and forward head  PALPATION: Tenderness and trigger points R UT, R levator, cervical paraspinals, decreased cervical mobility, elevated R 1st rib with tenderness in scalenes.  Increased neural tension in RUE.    CERVICAL ROM:   Active ROM A/PROM (deg) eval AROM  12/28/2021  Flexion 40 50  Extension 55 55  Right lateral flexion 22 35  Left lateral flexion 35 35  Right rotation 45 65*tight  Left rotation 70 75   (Blank rows = not tested)  UPPER EXTREMITY ROM:  Active ROM Right eval Left eval Right 12/28/21  Shoulder flexion 105 150 160  Shoulder extension     Shoulder abduction 95 140 130   (Blank rows = not tested)  UPPER EXTREMITY MMT:  MMT Right* eval Left eval Right 12/22/21  Shoulder flexion 3-/5 5 4+  Shoulder extension     Shoulder abduction 3-/5 5 4+  Shoulder adduction     Shoulder extension     Shoulder internal rotation 3/_0 Shoulder external rotation 5/_1 Elbow flexion 3/_2 Elbow extension 3/_3 Wrist flexion     Wrist extension     Grip strength 5 lbs  20 lbs 30 lbs   (Blank rows = not tested) *dominant hand  CERVICAL SPECIAL TESTS:   Spurling's test: Negative   TODAY'S TREATMENT:  12/28/2021 Therapeutic Exercise: to improve strength and mobility.  Demo, verbal and tactile cues throughout for technique. UBE x 6 min (56f3b) Review HEP Manual Therapy: to decrease muscle spasm and pain and improve mobility IASTM R pectoralis, STM to R UT, levator, scalenes, cervical paraspinals.  Physical Performance: UE and cervical ROM, progress towards goals, QuickDash Electrical Stimulation IFC to bil UT + MHP to B cervical region x 10 min   12/22/21 Therapeutic Exercise: to improve strength and mobility.   UBE L1 x 6 min (355fb) Row x 10 YTB Bil shoulder extension x 10 YTB W-arms YTB x 10 Diagonals x 10 bil  Manual Therapy: STM to R UT, levator,  scalenes, cervical paraspinals, still extremely tender Electrical Stimulation IFC to bil UT + MHP to B cervical region x 15 min  12/16/21 Therapeutic Exercises: UBE L1 4 min (2 fwd/2back) UT stretch x 30 sec Levator stretch x 30 sec R scalenes stretch x 30 sec Shoulder squeeze 10x3"  Manual Therapy: STM to R UT, levator, scalenes, cervical paraspinals  Estim + MHP to B cervical region 10 min  12/14/2021 Therapeutic Exercise: to improve strength and mobility.  Demo, verbal and tactile cues throughout for technique. Shoulder rolls retro Open book at wall LUE only Self snags with pillow case flexion and rotation.  Scalene stretch - demo only Traction - cervical traction with device, 18lbs x 5 min hold x 2 Manual Therapy: to decrease muscle spasm and pain and improve mobility Cervical PA mobs grade 1-2 (tender C5-6), NAGs into rotation, STM/TPR erector spinae R, R levator scapulae, R UT, R scalenes, myofascial release MHP x 10 min cervical in supine.    PATIENT EDUCATION:  Education details: continue HEP Person educated: Patient Education method: Consulting civil engineer, Media planner, Verbal cues, and Handouts Education comprehension: verbalized understanding   HOME EXERCISE  PROGRAM: Access Code: [email or web address]  ASSESSMENT:  CLINICAL IMPRESSION: Pt. Has made excellent progress and has met all goals. Her R shoulder ROM and cervical ROM has improved significantly and now WNL without pain.  Her quickDASH has improved from 30% impairment to 2.5% impairment.  She still has some tightness/tenderness R pectoralis and insertion and tightness throughout cervical paraspinals, which was focus of interventions today, followed by MHP and Estim to bil UT for muscle relaxation at end of session.  Based on progress and travel plans, she was placed on 30 day hold today.    OBJECTIVE IMPAIRMENTS decreased ROM, decreased strength, hypomobility, increased fascial restrictions, increased muscle spasms, impaired flexibility, impaired sensation, impaired UE functional use, postural dysfunction, and pain.   ACTIVITY LIMITATIONS carrying, lifting, bending, sleeping, dressing, and reach over head  PARTICIPATION LIMITATIONS: cleaning, laundry, driving, shopping, community activity, and yard work  PERSONAL FACTORS 1 comorbidity: history of R adhesive capsulitis  are also affecting patient's functional outcome.    GOALS: Goals reviewed with patient? Yes  SHORT TERM GOALS: Target date: 12/23/2021   Patient will be independent with initial HEP.  Baseline: given Goal status: MET   LONG TERM GOALS: Target date: 01/20/2022   Patient will be independent with advanced/ongoing HEP to improve outcomes and carryover.  Baseline: needs update Goal status: MET  2.  Patient will report 75% improvement in neck/RUE pain to improve QOL.  Baseline: 3/10 but increasing Goal status: MET  3.  Patient will demonstrate full pain free cervical ROM for safety with driving.  Baseline: see above Goal status: MET  4.  Patient will WNL R shoulder AROM without pain to perform ADLs and yard work. Baseline: see objective, painful and guarded movements.  Goal status: MET  5.  Patient will report <20% impairment  on DASH to demonstrate improved functional ability.  Baseline: 30% disability  Goal status: MET  6.  Patient will demonstrate 5/5 R shoulder strength to return to exercise program   Baseline: see above Goal status: MET   7. Patient will demonstrate improved grip strength to 20lbs to grip steering wheel and lift objects.  Baseline: 5 lbs compared to 20lbs non-dominant hand Goal status: MET   PLAN: PT FREQUENCY: 1-2x/week  PT DURATION: 6 weeks  PLANNED INTERVENTIONS: Therapeutic exercises, Therapeutic activity, Neuromuscular re-education, Balance training, Gait training, Patient/Family education, Joint mobilization, Dry  Needling, Electrical stimulation, Spinal mobilization, Cryotherapy, Moist heat, Traction, Ultrasound, and Manual therapy  PLAN FOR NEXT SESSION: 30 day hold   Rennie Natter, PT, DPT  12/28/2021, 11:15 AM

## 2021-12-29 ENCOUNTER — Ambulatory Visit (INDEPENDENT_AMBULATORY_CARE_PROVIDER_SITE_OTHER): Payer: Medicare HMO | Admitting: Orthopaedic Surgery

## 2021-12-29 ENCOUNTER — Encounter: Payer: Self-pay | Admitting: Orthopaedic Surgery

## 2021-12-29 DIAGNOSIS — M542 Cervicalgia: Secondary | ICD-10-CM | POA: Diagnosis not present

## 2021-12-29 DIAGNOSIS — M7541 Impingement syndrome of right shoulder: Secondary | ICD-10-CM

## 2021-12-29 MED ORDER — IBUPROFEN 800 MG PO TABS: 800.0000 mg | ORAL_TABLET | Freq: Three times a day (TID) | ORAL | 0 refills | Status: DC | PRN

## 2021-12-30 ENCOUNTER — Encounter: Payer: 59 | Admitting: Physical Therapy

## 2022-01-04 ENCOUNTER — Ambulatory Visit: Payer: Medicare HMO | Admitting: Physical Therapy

## 2022-01-06 ENCOUNTER — Encounter: Payer: 59 | Admitting: Physical Therapy

## 2022-01-13 ENCOUNTER — Encounter: Payer: 59 | Admitting: Physical Therapy

## 2022-01-22 IMAGING — MR MR SHOULDER*R* W/O CM
5 series · 35 of 40 positions shown · non-contrast
Comparison: X-ray 01/24/2020

CLINICAL DATA: Right shoulder pain for 3 months.  No known injury.

EXAM:
MRI OF THE RIGHT SHOULDER WITHOUT CONTRAST
TECHNIQUE: Multiplanar, multisequence MR imaging of the shoulder was performed.
No intravenous contrast was administered.

[Series 3: PD fat-sat · axial · 4.0mm · 0.55mm/px · z∈[-22,+61]mm · 7 of 20 slices shown (1 of 2)]
[im 1/20]
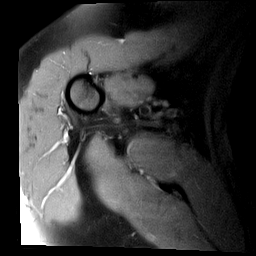
[im 4/20]
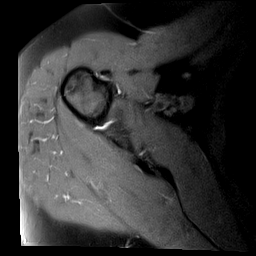
[im 7/20]
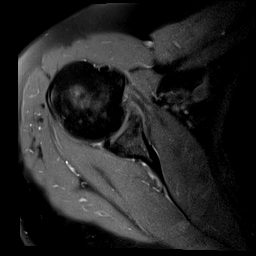
[im 10/20]
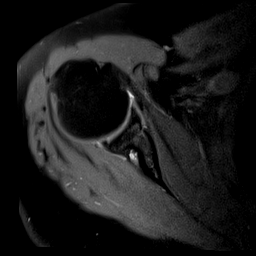
[im 13/20]
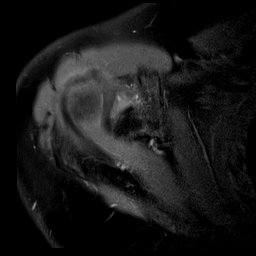
[im 16/20]
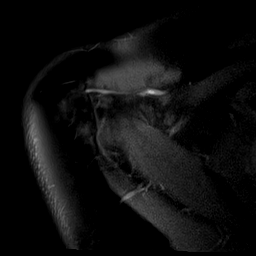
[im 20/20]
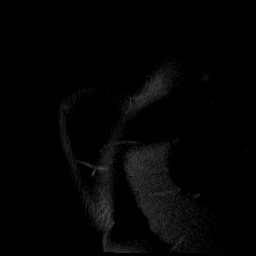

[Series 4: T1 · oblique · 4.0mm · 0.27mm/px · 4 of 21 slices shown]
[im 1/21]
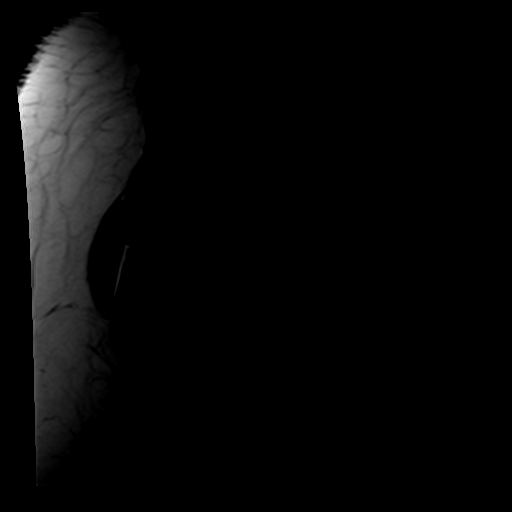
[im 3/21]
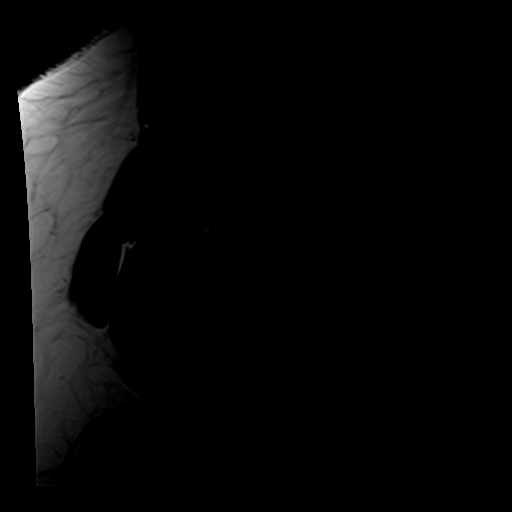
[im 6/21]
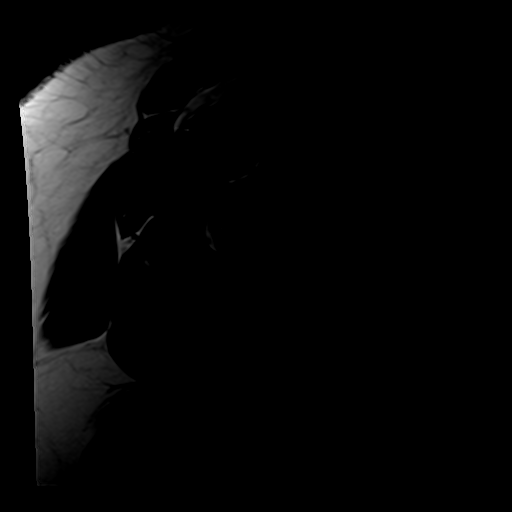
[im 8/21]
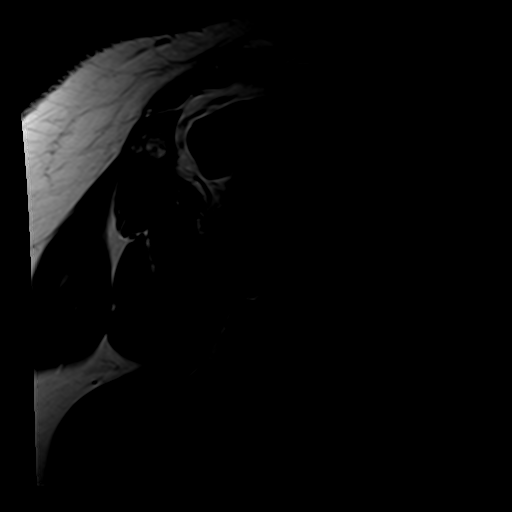

[Series 5: T2 fat-sat · oblique · 4.0mm · 0.55mm/px · 9 of 21 slices shown (1 of 2)]
[im 1/21]
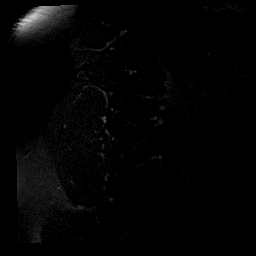
[im 3/21]
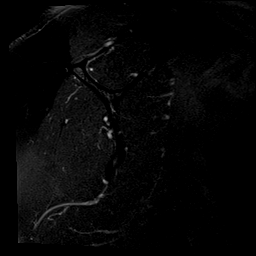
[im 6/21]
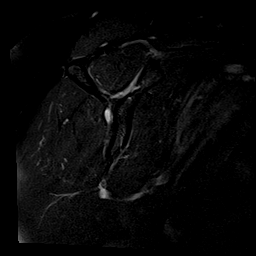
[im 8/21]
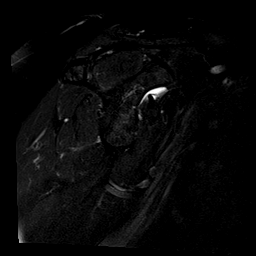
[im 11/21]
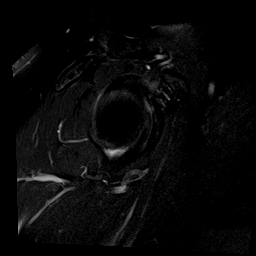
[im 13/21]
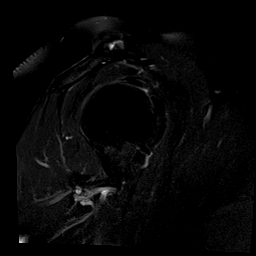
[im 16/21]
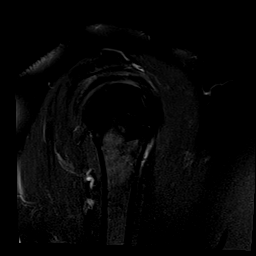
[im 18/21]
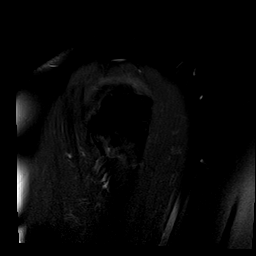
[im 21/21]
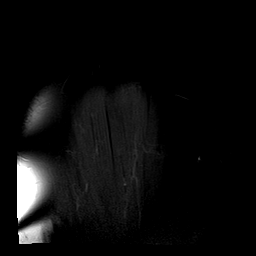

[Series 6: T2 fat-sat · oblique · 4.0mm · 0.55mm/px · 7 of 16 slices shown (2 of 2)]
[im 1/16]
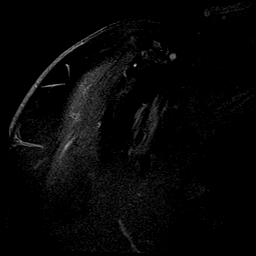
[im 3/16]
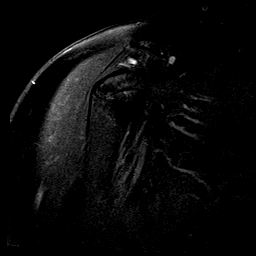
[im 6/16]
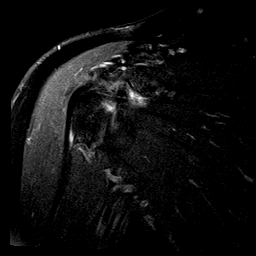
[im 8/16]
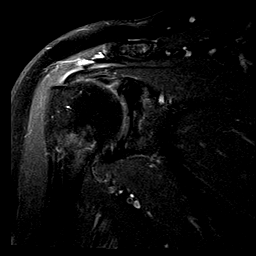
[im 11/16]
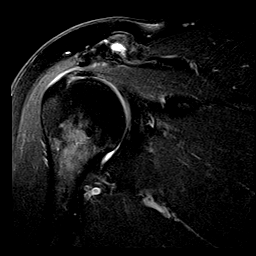
[im 13/16]
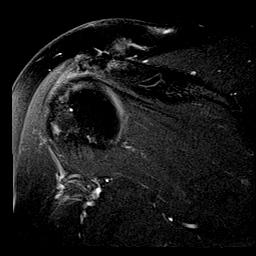
[im 16/16]
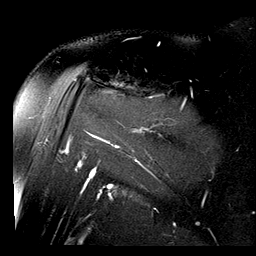

[Series 7: PD fat-sat · oblique · 4.0mm · 0.27mm/px · 8 of 18 slices shown (2 of 2)]
[im 1/18]
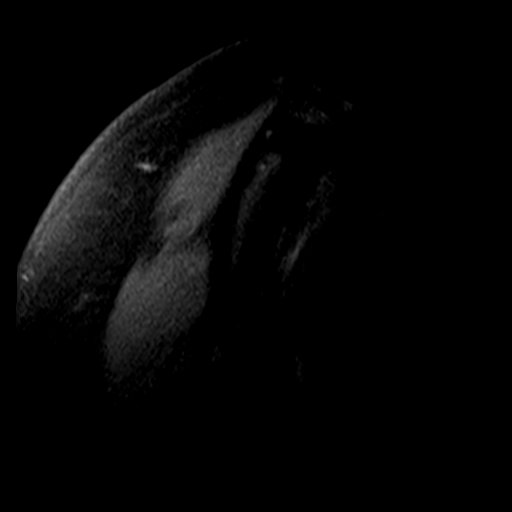
[im 3/18]
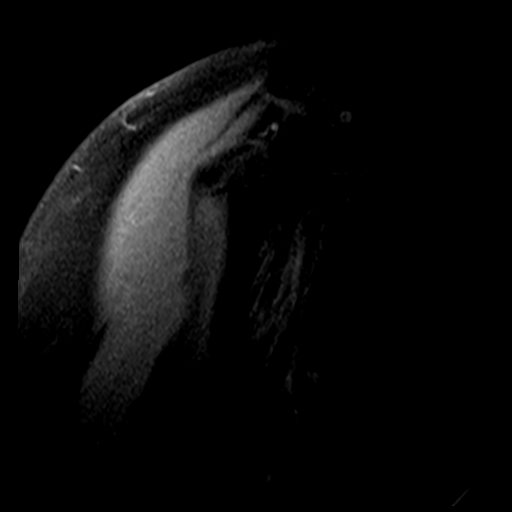
[im 5/18]
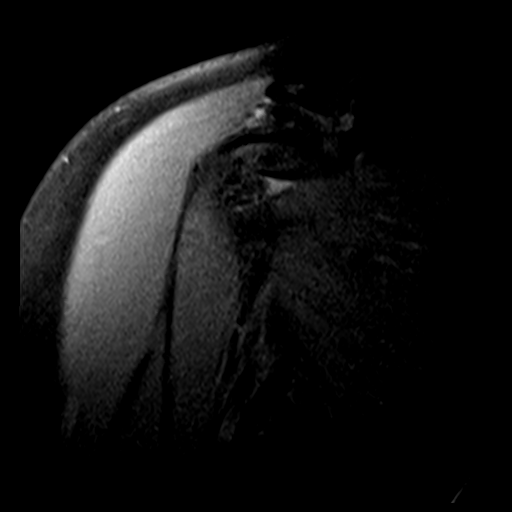
[im 8/18]
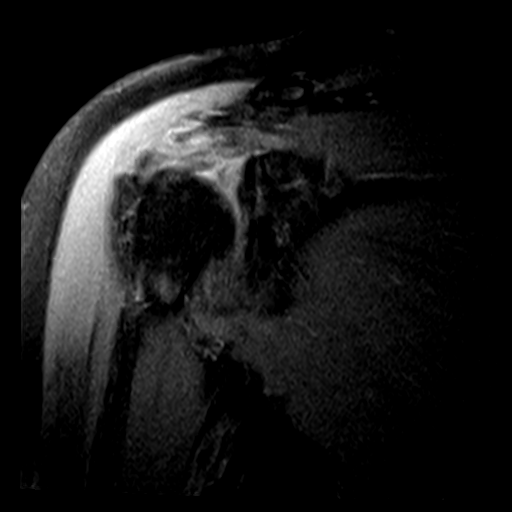
[im 10/18]
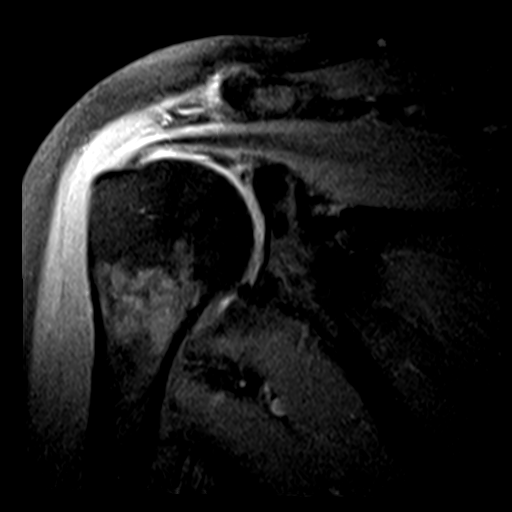
[im 13/18]
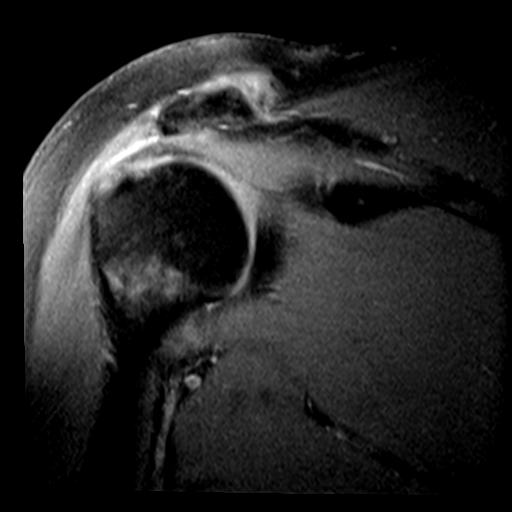
[im 15/18]
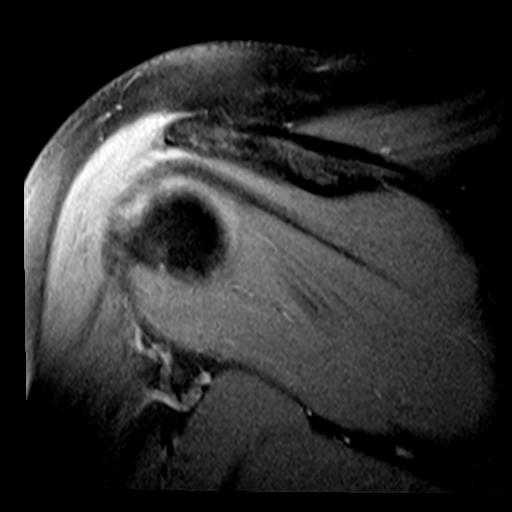
[im 18/18]
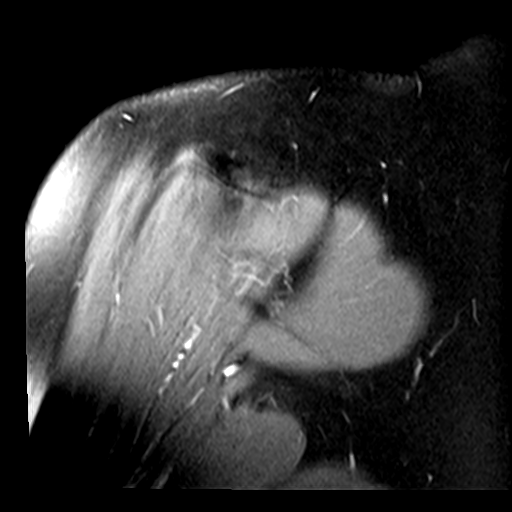

[35 of 40 positions shown; findings below may reference images not displayed]

FINDINGS: Rotator cuff: Mild supraspinatus and infraspinatus tendinosis with
tiny rim rent insertional tear near the interdigitating fibers
(series 6, image 7). Intact subscapularis and teres minor tendons.
No full-thickness rotator cuff tear.

Muscles: No atrophy or abnormal signal of the muscles of the rotator
cuff.

Biceps long head:  Intact.

Acromioclavicular Joint: Mild arthropathy of the acromioclavicular
joint. Small volume subacromial-subdeltoid bursal fluid.

Glenohumeral Joint: No joint effusion. No chondral defect.

Labrum: Grossly intact, but evaluation is limited by lack of
intraarticular fluid.

Bones:  No marrow abnormality, fracture or dislocation.

Other: None.
IMPRESSION: 1. Mild supraspinatus and infraspinatus tendinosis with tiny rim
rent insertional tear near the interdigitating fibers. No
full-thickness rotator cuff tear.
2. Mild subacromial-subdeltoid bursitis.
3. Mild acromioclavicular osteoarthritis.

## 2022-01-27 ENCOUNTER — Ambulatory Visit (INDEPENDENT_AMBULATORY_CARE_PROVIDER_SITE_OTHER): Payer: Medicare HMO

## 2022-01-27 ENCOUNTER — Encounter: Payer: Self-pay | Admitting: Orthopaedic Surgery

## 2022-01-27 ENCOUNTER — Ambulatory Visit: Payer: Medicare HMO | Admitting: Orthopaedic Surgery

## 2022-01-27 DIAGNOSIS — Z9889 Other specified postprocedural states: Secondary | ICD-10-CM

## 2022-01-27 DIAGNOSIS — M25561 Pain in right knee: Secondary | ICD-10-CM | POA: Diagnosis not present

## 2022-01-27 DIAGNOSIS — Z96651 Presence of right artificial knee joint: Secondary | ICD-10-CM | POA: Diagnosis not present

## 2022-01-27 NOTE — Progress Notes (Signed)
Office Visit Note   Patient: Lauren Frye           Date of Birth: 02-12-1951           MRN: [id number] Visit Date: 01/27/2022              Requested by: Kerin Salen, PA-C [address] [place],  [place] [address] PCP: Kerin Salen, PA-C   Assessment & Plan: Visit Diagnoses:  1. Post-operative state   2. S/P total knee arthroplasty, right   3. Acute pain of right knee     Plan: Ms. Bogdan recently returned from a trip to [place] where she was up and down, hiking and sitting in the car for long periods of time.  She is experiencing pain in her right total knee replacement.  She feels like it is a little tight but is never had an obvious injury or specific trauma.  She has not had any fever or chills.  On occasion she feels like it may want to give way.  X-rays look perfectly normal.  Her exam was normal without evidence of instability.  I do not think she had an effusion although she does have large knees.  There was no redness or hypertrophic change.  Her incision is healed very nicely.  She had multiple areas of tenderness about her knee out of proportion to what I would have expected.  Reassured her that I did not think there was any problem.  Hopefully this will just resolve over time with the over-the-counter medicines.  Follow-Up Instructions: Return if symptoms worsen or fail to improve.   Orders:  Orders Placed This Encounter  Procedures   XR KNEE 3 VIEW RIGHT   No orders of the defined types were placed in this encounter.     Procedures: No procedures performed   Clinical Data: No additional findings.   Subjective: Chief Complaint  Patient presents with   Right Knee - Follow-up   Patient presents today as a follow up of her right knee. Patient states that she was out of town two weeks ago and engaged in increased walking. During this time she noticed that her right knee started to give out on her while she was walking. She has  noticed increased swelling, aching, and sharp pains. She has had previous right total knee arthoplasty preformed in 2018. No medications are being used at this time.  Review of Systems   Objective: Vital Signs: There were no vitals taken for this visit.  Physical Exam Constitutional:      Appearance: She is well-developed.  Eyes:     Pupils: Pupils are equal, round, and reactive to light.  Pulmonary:     Effort: Pulmonary effort is normal.  Skin:    General: Skin is warm and dry.  Neurological:     Mental Status: She is alert and oriented to person, place, and time.  Psychiatric:        Behavior: Behavior normal.     Ortho Exam right knee was not hot red warm or swollen.  No obvious effusion although the knees are large.  No opening with varus valgus stress.  Full extension of flex to about 100 degrees.  No popliteal pain.  Multiple areas of tenderness about the knee and the distal femur and tibia mostly anterior.  No specific joint pain.  No pain over the patella tendon.  Skin was not red.  No ecchymosis.  Motor exam intact  Specialty Comments:  No specialty comments available.  Imaging: XR KNEE 3 VIEW RIGHT  Result Date: 01/27/2022 Films of the right total knee replacement were obtained in 3 projections.  Excellent glue mantle.  No evidence of loosening.  Nice alignment of all the components.  Patella appears to track in the midline.  No findings to explain the patient's discomfort    PMFS History: Patient Active Problem List   Diagnosis Date Noted   Pain in right knee 01/27/2022   Cervical pain (neck) 12/29/2021   Pain in right hand 01/01/2021   Adhesive capsulitis of right shoulder 03/13/2020   Impingement syndrome of right shoulder 01/24/2020   S/P total knee arthroplasty, right 01/08/2018   Primary localized osteoarthritis of right knee 01/03/2018   Spinal stenosis of lumbar region 05/30/2017   Postoperative anemia due to acute blood loss 10/11/2015   Arthritis  10/11/2015   Constipation 10/11/2015   Headache 10/11/2015   Osteoarthritis of left knee 10/07/2015   Obesity 10/07/2015   S/P total knee replacement using cement 10/07/2015   Past Medical History:  Diagnosis Date   Arthritis    Depression    denies   Headache    migraines    Family History  Problem Relation Age of Onset   Parkinson's disease Mother    COPD Mother    Diabetes Sister     Past Surgical History:  Procedure Laterality Date   ABDOMINAL HYSTERECTOMY     ANKLE FUSION     BOWEL RESECTION     KNEE ARTHROSCOPY     KNEE SURGERY Bilateral    LEFT ARTHROCOPY  X3    ,  RT X1   TONSILLECTOMY     TOTAL KNEE ARTHROPLASTY Left 10/07/2015   TOTAL KNEE ARTHROPLASTY Left 10/07/2015   Procedure: TOTAL KNEE ARTHROPLASTY;  Surgeon: Valeria Batman, MD;  Location[facility];  Service: Orthopedics;  Laterality: Left;   TOTAL KNEE ARTHROPLASTY Right 01/03/2018   TOTAL KNEE ARTHROPLASTY Right 01/03/2018   Procedure: RIGHT TOTAL KNEE ARTHROPLASTY;  Surgeon: Valeria Batman, MD;  Location: [facility];  Service: Orthopedics;  Laterality: Right;   Social History   Occupational History   Not on file  Tobacco Use   Smoking status: Never   Smokeless tobacco: Never  Vaping Use   Vaping Use: Never used  Substance and Sexual Activity   Alcohol use: Yes    Alcohol/week: 1.0 standard drink of alcohol    Types: 1 Glasses of wine per week    Comment: ONCE A week   Drug use: No   Sexual activity: Not Currently    Birth control/protection: Abstinence

## 2022-06-01 ENCOUNTER — Ambulatory Visit: Payer: Medicare HMO | Admitting: Podiatry

## 2022-06-01 ENCOUNTER — Ambulatory Visit (INDEPENDENT_AMBULATORY_CARE_PROVIDER_SITE_OTHER): Payer: Medicare HMO

## 2022-06-01 DIAGNOSIS — M79671 Pain in right foot: Secondary | ICD-10-CM | POA: Diagnosis not present

## 2022-06-01 DIAGNOSIS — B351 Tinea unguium: Secondary | ICD-10-CM | POA: Diagnosis not present

## 2022-06-01 DIAGNOSIS — M722 Plantar fascial fibromatosis: Secondary | ICD-10-CM

## 2022-06-01 MED ORDER — MELOXICAM 15 MG PO TABS: 15.0000 mg | ORAL_TABLET | Freq: Every day | ORAL | 0 refills | Status: DC | PRN

## 2022-06-01 NOTE — Progress Notes (Signed)
Subjective:   Patient ID: Lauren Frye, female   DOB: 71 y.o.   MRN: [id number]   HPI Chief Complaint  Patient presents with   Foot Problem    Patient came in today with a knot on the right foot in the arch, which started a month ago, patient rates the pain a 4 out of 10, patient has pain after doing a lot of walking, aches, X-Rays done today    71 year old female presents the above concerns.  She thought it was a bruse at first. She started taking line dancing classes at the gym and not sure if she was jumping. Pain is inermittent. She did not notice the knot until she put her socks on. The size has remained the same. No treatment.  Also has concerns about her toenail mostly her left big toe.   Review of Systems  All other systems reviewed and are negative.  Past Medical History:  Diagnosis Date   Arthritis    Depression    denies   Headache    migraines    Past Surgical History:  Procedure Laterality Date   ABDOMINAL HYSTERECTOMY     ANKLE FUSION     BOWEL RESECTION     KNEE ARTHROSCOPY     KNEE SURGERY Bilateral    LEFT ARTHROCOPY  X3    ,  RT X1   TONSILLECTOMY     TOTAL KNEE ARTHROPLASTY Left 10/07/2015   TOTAL KNEE ARTHROPLASTY Left 10/07/2015   Procedure: TOTAL KNEE ARTHROPLASTY;  Surgeon: Valeria Batman, MD;  Location: [facility];  Service: Orthopedics;  Laterality: Left;   TOTAL KNEE ARTHROPLASTY Right 01/03/2018   TOTAL KNEE ARTHROPLASTY Right 01/03/2018   Procedure: RIGHT TOTAL KNEE ARTHROPLASTY;  Surgeon: Valeria Batman, MD;  Location: [facility];  Service: Orthopedics;  Laterality: Right;     Current Outpatient Medications:    meloxicam (MOBIC) 15 MG tablet, Take 1 tablet (15 mg total) by mouth daily as needed for pain., Disp: 30 tablet, Rfl: 0   B Complex-C-Folic Acid (STRESS FORMULA) TABS, Take by mouth., Disp: , Rfl:    diclofenac (VOLTAREN) 0.1 % ophthalmic solution, 4 (four) times daily. (Patient not taking: Reported on 12/09/2021), Disp: , Rfl:     HYDROcodone-acetaminophen (NORCO/VICODIN) 5-325 MG tablet, Take 1-2 tablets by mouth every 4 (four) hours as needed for moderate pain. (Patient not taking: Reported on 12/09/2021), Disp: 30 tablet, Rfl: 0   ibuprofen (ADVIL) 800 MG tablet, Take 1 tablet (800 mg total) by mouth every 8 (eight) hours as needed., Disp: 30 tablet, Rfl: 0   ibuprofen (ADVIL,MOTRIN) 800 MG tablet, Take 1 tablet (800 mg total) by mouth every 8 (eight) hours as needed. (Patient not taking: Reported on 12/09/2021), Disp: 30 tablet, Rfl: 0   meloxicam (MOBIC) 15 MG tablet, Take 1 tablet (15 mg total) by mouth daily. (Patient not taking: Reported on 12/09/2021), Disp: 30 tablet, Rfl: 1   methocarbamol (ROBAXIN) 500 MG tablet, Take 1 tablet (500 mg total) by mouth every 6 (six) hours as needed for muscle spasms. (Patient not taking: Reported on 06/01/2022), Disp: 30 tablet, Rfl: 0   Multiple Vitamins-Minerals (STRESS TAB NF PO), Take 1 tablet by mouth daily., Disp: , Rfl:    Probiotic Product (PROBIOTIC ADVANCED PO), Take 3 capsules by mouth daily., Disp: , Rfl:   Allergies  Allergen Reactions   Codeine Anaphylaxis, Hives, Itching and Swelling    Possible swelling in throat   Penicillins Anaphylaxis    Has patient  had a PCN reaction causing immediate rash, facial/tongue/throat swelling, SOB or lightheadedness with hypotension: Unknown Has patient had a PCN reaction causing severe rash involving mucus membranes or skin necrosis: Unknown Has patient had a PCN reaction that required hospitalization: Unknown--treated in ER Has patient had a PCN reaction occurring within the last 10 years: No CHILDHOOD REACTION. If all of the above answers are "NO", then may proceed with Cephalosporin use.    Propoxyphene Hives, Itching and Swelling    POSSIBLE swelling in throat   Prednisone Swelling    Undefined as to specifics          Objective:  Physical Exam  General: AAO x3, NAD  Dermatological: Left hallux toenail is removed from  the underlying nailbed distally there is yellow, brown discoloration.  No edema, erythema or signs of infection.  No open lesions.  Second digit nail mildly hypertrophic as well.  No open lesions.  Vascular: Dorsalis Pedis artery and Posterior Tibial artery pedal pulses are 2/4 bilateral with immedate capillary fill time. There is no pain with calf compression, swelling, warmth, erythema.   Neruologic: Grossly intact via light touch bilateral.  Musculoskeletal: On the medial band plantar fascia the arch of the right foot is a firm soft tissue mass present with tenderness to palpation.  There is localized edema there is faint erythema which think is more from inflammation as opposed to infection.  No fluctuation or crepitation.  Mass appears to be firm.  Gait: Unassisted, Nonantalgic.       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  3 views right foot were obtained.  No evidence of acute fracture, calcifications. -I do think symptoms of the right foot are consistent with a plantar fibroma.  We discussed stretching, icing.  Offered steroid injection to hold off on this today.  Prescribed meloxicam to take as needed.  I ordered a compound cream through [facility] to help with scarring, plantar fibroma.  Icing daily.  Kidney appears muscular will order an ultrasound to rule out any other underlying allergy. -Sharply debrided left hallux nail to this for culture, pathology to [facility] -Urea nail gel.  Vivi Barrack DPM

## 2022-06-01 NOTE — Patient Instructions (Signed)
I have ordered a medication for you that will come from [facility] in [place]. They should be calling you to verify insurance and will mail the medication to you. If you live close by then you can go by their pharmacy to pick up the medication. Their phone number is [phone]. If you do not hear from them in the next few days, please give [place] a call at [phone].   For the thicker toenail I like UREA NAIL GEL  Plantar Fasciitis (Heel Spur Syndrome) with Rehab The plantar fascia is a fibrous, ligament-like, soft-tissue structure that spans the bottom of the foot. Plantar fasciitis is a condition that causes pain in the foot due to inflammation of the tissue. SYMPTOMS  Pain and tenderness on the underneath side of the foot. Pain that worsens with standing or walking. CAUSES  Plantar fasciitis is caused by irritation and injury to the plantar fascia on the underneath side of the foot. Common mechanisms of injury include: Direct trauma to bottom of the foot. Damage to a small nerve that runs under the foot where the main fascia attaches to the heel bone. Stress placed on the plantar fascia due to bone spurs. RISK INCREASES WITH:  Activities that place stress on the plantar fascia (running, jumping, pivoting, or cutting). Poor strength and flexibility. Improperly fitted shoes. Tight calf muscles. Flat feet. Failure to warm-up properly before activity. Obesity. PREVENTION Warm up and stretch properly before activity. Allow for adequate recovery between workouts. Maintain physical fitness: Strength, flexibility, and endurance. Cardiovascular fitness. Maintain a health body weight. Avoid stress on the plantar fascia. Wear properly fitted shoes, including arch supports for individuals who have flat feet.  PROGNOSIS  If treated properly, then the symptoms of plantar fasciitis usually resolve without surgery. However, occasionally surgery is necessary.  RELATED COMPLICATIONS   Recurrent symptoms that may result in a chronic condition. Problems of the lower back that are caused by compensating for the injury, such as limping. Pain or weakness of the foot during push-off following surgery. Chronic inflammation, scarring, and partial or complete fascia tear, occurring more often from repeated injections.  TREATMENT  Treatment initially involves the use of ice and medication to help reduce pain and inflammation. The use of strengthening and stretching exercises may help reduce pain with activity, especially stretches of the Achilles tendon. These exercises may be performed at home or with a therapist. Your caregiver may recommend that you use heel cups of arch supports to help reduce stress on the plantar fascia. Occasionally, corticosteroid injections are given to reduce inflammation. If symptoms persist for greater than 6 months despite non-surgical (conservative), then surgery may be recommended.   MEDICATION  If pain medication is necessary, then nonsteroidal anti-inflammatory medications, such as aspirin and ibuprofen, or other minor pain relievers, such as acetaminophen, are often recommended. Do not take pain medication within 7 days before surgery. Prescription pain relievers may be given if deemed necessary by your caregiver. Use only as directed and only as much as you need. Corticosteroid injections may be given by your caregiver. These injections should be reserved for the most serious cases, because they may only be given a certain number of times.  HEAT AND COLD Cold treatment (icing) relieves pain and reduces inflammation. Cold treatment should be applied for 10 to 15 minutes every 2 to 3 hours for inflammation and pain and immediately after any activity that aggravates your symptoms. Use ice packs or massage the area with a piece of ice (ice massage).  Heat treatment may be used prior to performing the stretching and strengthening activities prescribed by your  caregiver, physical therapist, or athletic trainer. Use a heat pack or soak the injury in warm water.  SEEK IMMEDIATE MEDICAL CARE IF: Treatment seems to offer no benefit, or the condition worsens. Any medications produce adverse side effects.  EXERCISES- RANGE OF MOTION (ROM) AND STRETCHING EXERCISES - Plantar Fasciitis (Heel Spur Syndrome) These exercises may help you when beginning to rehabilitate your injury. Your symptoms may resolve with or without further involvement from your physician, physical therapist or athletic trainer. While completing these exercises, remember:  Restoring tissue flexibility helps normal motion to return to the joints. This allows healthier, less painful movement and activity. An effective stretch should be held for at least 30 seconds. A stretch should never be painful. You should only feel a gentle lengthening or release in the stretched tissue.  RANGE OF MOTION - Toe Extension, Flexion Sit with your right / left leg crossed over your opposite knee. Grasp your toes and gently pull them back toward the top of your foot. You should feel a stretch on the bottom of your toes and/or foot. Hold this stretch for 10 seconds. Now, gently pull your toes toward the bottom of your foot. You should feel a stretch on the top of your toes and or foot. Hold this stretch for 10 seconds. Repeat  times. Complete this stretch 3 times per day.   RANGE OF MOTION - Ankle Dorsiflexion, Active Assisted Remove shoes and sit on a chair that is preferably not on a carpeted surface. Place right / left foot under knee. Extend your opposite leg for support. Keeping your heel down, slide your right / left foot back toward the chair until you feel a stretch at your ankle or calf. If you do not feel a stretch, slide your bottom forward to the edge of the chair, while still keeping your heel down. Hold this stretch for 10 seconds. Repeat 3 times. Complete this stretch 2 times per day.    STRETCH  Gastroc, Standing Place hands on wall. Extend right / left leg, keeping the front knee somewhat bent. Slightly point your toes inward on your back foot. Keeping your right / left heel on the floor and your knee straight, shift your weight toward the wall, not allowing your back to arch. You should feel a gentle stretch in the right / left calf. Hold this position for 10 seconds. Repeat 3 times. Complete this stretch 2 times per day.  STRETCH  Soleus, Standing Place hands on wall. Extend right / left leg, keeping the other knee somewhat bent. Slightly point your toes inward on your back foot. Keep your right / left heel on the floor, bend your back knee, and slightly shift your weight over the back leg so that you feel a gentle stretch deep in your back calf. Hold this position for 10 seconds. Repeat 3 times. Complete this stretch 2 times per day.  STRETCH  Gastrocsoleus, Standing  Note: This exercise can place a lot of stress on your foot and ankle. Please complete this exercise only if specifically instructed by your caregiver.  Place the ball of your right / left foot on a step, keeping your other foot firmly on the same step. Hold on to the wall or a rail for balance. Slowly lift your other foot, allowing your body weight to press your heel down over the edge of the step. You should feel a stretch  in your right / left calf. Hold this position for 10 seconds. Repeat this exercise with a slight bend in your right / left knee. Repeat 3 times. Complete this stretch 2 times per day.   STRENGTHENING EXERCISES - Plantar Fasciitis (Heel Spur Syndrome)  These exercises may help you when beginning to rehabilitate your injury. They may resolve your symptoms with or without further involvement from your physician, physical therapist or athletic trainer. While completing these exercises, remember:  Muscles can gain both the endurance and the strength needed for everyday activities  through controlled exercises. Complete these exercises as instructed by your physician, physical therapist or athletic trainer. Progress the resistance and repetitions only as guided.  STRENGTH - Towel Curls Sit in a chair positioned on a non-carpeted surface. Place your foot on a towel, keeping your heel on the floor. Pull the towel toward your heel by only curling your toes. Keep your heel on the floor. Repeat 3 times. Complete this exercise 2 times per day.  STRENGTH - Ankle Inversion Secure one end of a rubber exercise band/tubing to a fixed object (table, pole). Loop the other end around your foot just before your toes. Place your fists between your knees. This will focus your strengthening at your ankle. Slowly, pull your big toe up and in, making sure the band/tubing is positioned to resist the entire motion. Hold this position for 10 seconds. Have your muscles resist the band/tubing as it slowly pulls your foot back to the starting position. Repeat 3 times. Complete this exercises 2 times per day.  Document Released: 06/21/2005 Document Revised: 09/13/2011 Document Reviewed: 10/03/2008 [facility] Patient Information 2014 [place], [place].

## 2022-06-04 ENCOUNTER — Other Ambulatory Visit: Payer: Self-pay | Admitting: Podiatry

## 2022-06-04 DIAGNOSIS — M722 Plantar fascial fibromatosis: Secondary | ICD-10-CM

## 2022-06-04 DIAGNOSIS — B351 Tinea unguium: Secondary | ICD-10-CM

## 2022-06-04 DIAGNOSIS — M79671 Pain in right foot: Secondary | ICD-10-CM

## 2022-06-17 ENCOUNTER — Ambulatory Visit
Admission: RE | Admit: 2022-06-17 | Discharge: 2022-06-17 | Disposition: A | Discharge status: Home / Self Care | Payer: Medicare HMO | Source: Ambulatory Visit | Attending: Podiatry | Admitting: Podiatry

## 2022-06-17 DIAGNOSIS — M722 Plantar fascial fibromatosis: Secondary | ICD-10-CM

## 2022-06-18 ENCOUNTER — Telehealth: Payer: Self-pay | Admitting: *Deleted

## 2022-06-18 NOTE — Telephone Encounter (Signed)
Patient returned the call back and was given the results of her nail culture, verbalized understanding and will get the urea nail gel.

## 2022-06-21 ENCOUNTER — Telehealth: Payer: Self-pay | Admitting: Podiatry

## 2022-06-21 NOTE — Telephone Encounter (Signed)
Pt scheduled to see Dr Ardelle Anton in [place] on 12.22

## 2022-06-25 ENCOUNTER — Ambulatory Visit: Payer: Medicare HMO | Admitting: Podiatry

## 2022-06-25 DIAGNOSIS — M7989 Other specified soft tissue disorders: Secondary | ICD-10-CM | POA: Diagnosis not present

## 2022-06-25 MED ORDER — MELOXICAM 15 MG PO TABS: 15.0000 mg | ORAL_TABLET | Freq: Every day | ORAL | 0 refills | Status: AC | PRN

## 2022-06-25 NOTE — Patient Instructions (Signed)

## 2022-07-01 NOTE — Progress Notes (Signed)
Subjective: Chief Complaint  Patient presents with   Foot Pain    Right foot pain. Patient states she has some concerns about possible  surgery    71 year old female presents the office today for follow-up evaluation of the soft tissue mass in the right foot.  Is not as inflamed and tender as it was but still present causing pain.  She states that the topical cream is not helping much.  I have called her previously discussed the ultrasound results and she presents today to further discuss this.   Objective: AAO x3, NAD DP/PT pulses palpable bilaterally, CRT less than 3 seconds On the plantar aspect the right arch of the foot continues to be a soft tissue mass.  Is not as inflamed or swollen or red as it was previously but she is having tenderness palpation.  Appears to be somewhat smaller.  No pain with calf compression, swelling, warmth, erythema  Assessment: Soft tissue mass right foot  Plan: -All treatment options discussed with the patient including all alternatives, risks, complications.  -We reviewed the ultrasound.  We discussed with conservative as well as surgical treatment options.  Ultimately she did decide to go and proceed with injection today.  If no improvement consider excision.  Skin was cleaned with alcohol and mixture of 1 cc dexamethasone phosphate, 1 cc Marcaine plain was infiltrated into the soft tissue mass from a medial approach without any complications.  Postinjection care discussed.  Tolerated well. -Patient encouraged to call the office with any questions, concerns, change in symptoms.   Vivi Barrack DPM

## 2022-08-09 ENCOUNTER — Ambulatory Visit: Payer: Medicare HMO | Admitting: Podiatry

## 2022-08-09 DIAGNOSIS — M7989 Other specified soft tissue disorders: Secondary | ICD-10-CM

## 2022-08-09 DIAGNOSIS — M79671 Pain in right foot: Secondary | ICD-10-CM

## 2022-08-09 NOTE — Progress Notes (Signed)
Subjective: Chief Complaint  Patient presents with   soft tissue mass    3-4 weeks in [place] for soft tissue mass    72 year old female presents the office today for follow-up evaluation of the soft tissue mass in the right foot.  She states the injection did not help.  She is continue to have pain and she wants to have the soft tissue mass removed.  No recent injury or changes otherwise.   Objective: AAO x3, NAD DP/PT pulses palpable bilaterally, CRT less than 3 seconds On the plantar aspect the right arch of the foot continues to be a soft tissue mass.  While it does appear to be somewhat softer and a little smaller it still is painful and there is tenderness on exam.  There is no edema, erythema noted today.  No other area discomfort noted. No pain with calf compression, swelling, warmth, erythema  Assessment: Soft tissue mass right foot  Plan: -All treatment options discussed with the patient including all alternatives, risks, complications.  -Given the continuation of symptoms we discussed conservative or surgical treatment.  He was to proceed with surgery to excise the cyst and I agree with this given her continued pain.  We discussed soft tissue mass excision as well as the postoperative course.  Discussed the risk of recurrence as well as other risks noted below. -The incision placement as well as the postoperative course was discussed with the patient. I discussed risks of the surgery which include, but not limited to, infection, bleeding, pain, swelling, need for further surgery, delayed or nonhealing, painful or ugly scar, numbness or sensation changes, recurrence, transfer lesions, further deformity, DVT/PE, loss of toe/foot. Patient understands these risks and wishes to proceed with surgery. The surgical consent was reviewed with the patient all 3 pages were signed. No promises or guarantees were given to the outcome of the procedure. All questions were answered to the best of my  ability. Before the surgery the patient was encouraged to call the office if there is any further questions. The surgery will be performed at the [facility] on an outpatient basis. -CAM boot dispensed for postoperative use  No follow-ups on file.  Trula Slade DPM

## 2022-08-09 NOTE — Patient Instructions (Signed)
Pre-Operative Instructions  Congratulations, you have decided to take an important step to improving your quality of life.  You can be assured that the doctors of [facility] will be with you every step of the way.  Plan to be at the surgery center/hospital at least 1 (one) hour prior to your scheduled time unless otherwise directed by the surgical center/hospital staff.  You must have a responsible adult accompany you, remain during the surgery and drive you home.  Make sure you have directions to the surgical center/hospital and know how to get there on time. For hospital based surgery you will need to obtain a history and physical form from your family physician within 1 month prior to the date of surgery- we will give you a form for you primary physician.  We make every effort to accommodate the date you request for surgery.  There are however, times where surgery dates or times have to be moved.  We will contact you as soon as possible if a change in schedule is required.   No Aspirin/Ibuprofen for one week before surgery.  If you are on aspirin, any non-steroidal anti-inflammatory medications (Mobic, Aleve, Ibuprofen) you should stop taking it 7 days prior to your surgery.  You make take Tylenol  For pain prior to surgery.  Medications- If you are taking daily heart and blood pressure medications, seizure, reflux, allergy, asthma, anxiety, pain or diabetes medications, make sure the surgery center/hospital is aware before the day of surgery so they may notify you which medications to take or avoid the day of surgery. No food or drink after midnight the night before surgery unless directed otherwise by surgical center/hospital staff. No alcoholic beverages 24 hours prior to surgery.  No smoking 24 hours prior to or 24 hours after surgery. Wear loose pants or shorts- loose enough to fit over bandages, boots, and casts. No slip on shoes, sneakers are best. Bring your boot with you to the  surgery center/hospital.  Also bring crutches or a walker if your physician has prescribed it for you.  If you do not have this equipment, it will be provided for you after surgery. If you have not been contracted by the surgery center/hospital by the day before your surgery, call to confirm the date and time of your surgery. Leave-time from work may vary depending on the type of surgery you have.  Appropriate arrangements should be made prior to surgery with your employer. Prescriptions will be provided immediately following surgery by your doctor.  Have these filled as soon as possible after surgery and take the medication as directed. Remove nail polish on the operative foot. Wash the night before surgery.  The night before surgery wash the foot and leg well with the antibacterial soap provided and water paying special attention to beneath the toenails and in between the toes.  Rinse thoroughly with water and dry well with a towel.  Perform this wash unless told not to do so by your physician.  Enclosed: 1 Ice pack (please put in freezer the night before surgery)   1 Hibiclens skin cleaner   Pre-op Instructions  If you have any questions regarding the instructions, do not hesitate to call our office at any point during this process.   [place]: [address] [place], [place] [address] [phone]  [place]: [address] [place], [place] [address] [phone]  Dr. Bethzy Hauck, DPM  

## 2022-08-10 ENCOUNTER — Telehealth: Payer: Self-pay | Admitting: *Deleted

## 2022-08-10 NOTE — Telephone Encounter (Signed)
Patient is calling to change pharmacy to [facility] in [place]

## 2022-08-12 ENCOUNTER — Telehealth: Payer: Self-pay | Admitting: Urology

## 2022-08-12 NOTE — Telephone Encounter (Signed)
DOS - 09/08/22  EXC BENIGN LESION RIGHT --- 11422  [employer]   PER [employer] AUTOMATIVE SYSTEM FOR CPT CODE 56213 NO PRIOR AUTH IS REQUIRED.  REF # [id number]

## 2022-09-08 ENCOUNTER — Other Ambulatory Visit: Payer: Self-pay | Admitting: Podiatry

## 2022-09-08 ENCOUNTER — Encounter: Payer: Self-pay | Admitting: Podiatry

## 2022-09-08 DIAGNOSIS — D2121 Benign neoplasm of connective and other soft tissue of right lower limb, including hip: Secondary | ICD-10-CM | POA: Diagnosis not present

## 2022-09-08 MED ORDER — OXYCODONE-ACETAMINOPHEN 5-325 MG PO TABS: 1.0000 | ORAL_TABLET | Freq: Four times a day (QID) | ORAL | 0 refills | Status: DC | PRN

## 2022-09-08 MED ORDER — PROMETHAZINE HCL 25 MG PO TABS
25.0000 mg | ORAL_TABLET | Freq: Three times a day (TID) | ORAL | 0 refills | Status: AC | PRN
Start: 2022-09-08 — End: ?

## 2022-09-08 MED ORDER — CLINDAMYCIN HCL 300 MG PO CAPS
300.0000 mg | ORAL_CAPSULE | Freq: Three times a day (TID) | ORAL | 0 refills | Status: AC
Start: 2022-09-08 — End: ?

## 2022-09-08 NOTE — Progress Notes (Signed)
Post op medications sent 

## 2022-09-14 ENCOUNTER — Ambulatory Visit (INDEPENDENT_AMBULATORY_CARE_PROVIDER_SITE_OTHER): Payer: Medicare HMO | Admitting: Podiatry

## 2022-09-14 VITALS — BP 124/84 | HR 98

## 2022-09-14 DIAGNOSIS — M7989 Other specified soft tissue disorders: Secondary | ICD-10-CM | POA: Diagnosis not present

## 2022-09-14 NOTE — Progress Notes (Unsigned)
Patient presents today for post op visit # 1 , patient of Dr. Jacqualyn Posey   POV #1 DOS 09/08/22 --- RIGHT FOOT SOFT TISSUE MASS EXCISION    Patient presents in Cam boot non-weight bearing. Denies any falls or injury to the foot. Foot is slightly swollen. No signs of infection. No calf pain or shortness of breath. Bandages dry and intact. Incision is intact.  Dr. Jacqualyn Posey did advise patient to continue to be non-weight bearing at this time and patient to return in 1 week for possible suture removal. Patient verbalized understanding.    BP: 124/84 P: 98     Dr. Jacqualyn Posey did take a look at the foot today as well.   Foot redressed today and placed back in the boot. Reviewed icing and elevation. Patient will follow up with Dr. Jacqualyn Posey for POV# 2 and possible suture removal in 1 week.  -  I also personally evaluated the patient.  Incision well coapted with sutures intact but any signs of infection.  No erythema or warmth.  Mild tenderness palpation.  There is minimal edema.  Discussed that would like to be mostly nonweightbearing but can weight-bear to the heel for short distances around the house as needed.  Monitor for any clinical signs or symptoms of infection and directed to call the office immediately should any occur or go to the ER.  Return in about 1 week (around 09/21/2022) for POV #2 Dr. Jacqualyn Posey suture removal.  Trula Slade DPM

## 2022-09-21 ENCOUNTER — Ambulatory Visit (INDEPENDENT_AMBULATORY_CARE_PROVIDER_SITE_OTHER): Payer: Medicare HMO

## 2022-09-21 DIAGNOSIS — M7989 Other specified soft tissue disorders: Secondary | ICD-10-CM

## 2022-09-21 NOTE — Progress Notes (Signed)
Patient presents today for post op visit # 2, patient of Dr. Jacqualyn Posey.   POV 2 DOS 09/08/22 --- RIGHT FOOT SOFT TISSUE MASS EXCISION     Every other suture removed today without complication. Patient tolerated suture removal well. Incisions look good and no signs of infections. Advised patient that she can begin to put weight on the surgical foot in the boot around the house, careful not to over do it. Patient verbal understanding. Patient foot redressed and antibiotic ointment applied to incision site.    Reviewed icing and elevation. Patient will follow up with Dr. Jacqualyn Posey  for POV# 3 in 1 week.

## 2022-09-23 ENCOUNTER — Ambulatory Visit: Payer: Medicare HMO

## 2022-09-28 ENCOUNTER — Ambulatory Visit (INDEPENDENT_AMBULATORY_CARE_PROVIDER_SITE_OTHER): Payer: Medicare HMO | Admitting: Podiatry

## 2022-09-28 VITALS — BP 133/85 | HR 100

## 2022-09-28 DIAGNOSIS — M7989 Other specified soft tissue disorders: Secondary | ICD-10-CM

## 2022-09-28 NOTE — Progress Notes (Signed)
Patient presents today for post op visit # 3, patient of Dr. Jacqualyn Posey.   POV # 3 DOS  09/08/22 Right Foot Soft Tissue Mass Excision   Remaining sutures removed today without complication. Patient tolerated suture removal well. Incisions look good and no signs of infections. Steri-strips applied to incision with large band-aid for comfort.   Dr. Jacqualyn Posey did take a look at the patient's foot as well today.    Reviewed icing and elevation. Patient will follow up with Dr. Jacqualyn Posey  for POV# 4. -  Also personally evaluated the patient.  Sutures removed.  Incisions healing without any signs of infection.  Continue cam boot for now.  Once the scar is starting to form she has scar cream at home that she can start using.  I like to remain in the boot until the scar is formed and she can gradually transition to regular shoe.  Continue ice, elevate as well as compression.

## 2022-10-07 ENCOUNTER — Encounter: Payer: Medicare HMO | Admitting: Podiatry

## 2022-10-14 ENCOUNTER — Encounter: Payer: Self-pay | Admitting: Podiatry

## 2022-10-14 ENCOUNTER — Ambulatory Visit (INDEPENDENT_AMBULATORY_CARE_PROVIDER_SITE_OTHER): Payer: Medicare HMO | Admitting: Podiatry

## 2022-10-14 DIAGNOSIS — M722 Plantar fascial fibromatosis: Secondary | ICD-10-CM

## 2022-10-14 NOTE — Progress Notes (Signed)
Subjective:   Patient ID: Lauren Frye, female   DOB: 72 y.o.   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  Patient will see Dr. Kenna Gilbert assistant in 3 weeks be reevaluated hopefully that will be the end of this but at this point everything is moving along beautifully

## 2022-11-11 ENCOUNTER — Ambulatory Visit (INDEPENDENT_AMBULATORY_CARE_PROVIDER_SITE_OTHER): Payer: Medicare HMO | Admitting: Podiatry

## 2022-11-11 DIAGNOSIS — M722 Plantar fascial fibromatosis: Secondary | ICD-10-CM

## 2022-11-16 NOTE — Progress Notes (Signed)
Subjective: Chief Complaint  Patient presents with   Routine Post Op    POV # 5 DOS 09/08/22 --- RIGHT FOOT SOFT TISSUE MASS EXCISION    Lauren Frye is a 72 y.o. is seen today in office s/p plantar fibroma excision preformed on 09/08/2022.  Since that she is doing well and her pain is improved.  She has been regular shoes.  She has been using the cream somewhat that I ordered prior to surgery for scarring.    Objective: General: No acute distress, AAOx3  DP/PT pulses palpable 2/4, CRT < 3 sec to all digits.  Protective sensation intact. Motor function intact.  Right foot: Incision is well coapted without any evidence of dehiscence and eschar is formed.  There is no surrounding erythema, ascending cellulitis, fluctuance, crepitus, malodor, drainage/purulence. There is trace edema around the surgical site. There is no significant pain along the surgical site.  No other areas of tenderness to bilateral lower extremities.  No other open lesions or pre-ulcerative lesions.  No pain with calf compression, swelling, warmth, erythema.   Assessment and Plan:  Status post plantar fibroma excision, doing well with no complications   -Treatment options discussed including all alternatives, risks, and complications -Overall doing well.  Discussed continue with the cream on a regular basis of the scar tissue.  Discussed traction exercises.  Continue shoes, good arch support. -Monitor for any clinical signs or symptoms of infection and DVT/PE and directed to call the office immediately should any occur or go to the ER. -Follow-up as needed. In the meantime, encouraged to call the office with any questions, concerns, change in symptoms.  -On discharge from postop course and she agrees to this plan but should she have any concerns or questions let me know and she agrees.  Ovid Curd, DPM

## 2022-12-06 ENCOUNTER — Encounter: Payer: Self-pay | Admitting: Podiatry

## 2023-09-21 ENCOUNTER — Other Ambulatory Visit (INDEPENDENT_AMBULATORY_CARE_PROVIDER_SITE_OTHER): Payer: Self-pay

## 2023-09-21 ENCOUNTER — Ambulatory Visit: Admitting: Orthopedic Surgery

## 2023-09-21 DIAGNOSIS — M25561 Pain in right knee: Secondary | ICD-10-CM | POA: Diagnosis not present

## 2023-09-21 DIAGNOSIS — M25562 Pain in left knee: Secondary | ICD-10-CM

## 2023-09-22 ENCOUNTER — Encounter: Payer: Self-pay | Admitting: Orthopedic Surgery

## 2023-09-22 NOTE — Progress Notes (Unsigned)
 Office Visit Note   Patient: Lauren Frye           Date of Birth: Nov 04, 1950           MRN: [id number] Visit Date: 09/21/2023 Requested by: Kerin Salen, PA-C [address] [place],  [place] [address] PCP: Kerin Salen, PA-C  Subjective: Chief Complaint  Patient presents with   Right Knee - Pain   Left Knee - Pain    HPI: Lauren Frye is a 73 y.o. female who presents to the office reporting right greater than left knee pain.  Patient had right total knee replacement 2019.  Left total knee replacement 2017.  Reports swelling and both knees.  Felt a pop in the right knee 3 weeks ago.  Likes to go to the gym 4 days a week.  Denies any fevers or chills or warmth.  The pop she felt was behind her knee..                ROS: All systems reviewed are negative as they relate to the chief complaint within the history of present illness.  Patient denies fevers or chills.  Assessment & Plan: Visit Diagnoses:  1. Pain in both knees, unspecified chronicity     Plan: Impression is well-functioning right and left knee replacement.  No evidence of loosening infection or instability.  Radiographs look very good.  Plan at this time is observation.  She may have ruptured a Baker's cyst in the back of her knee but from a structural and functional standpoint there are no red flags today.  Follow-up as needed.  Follow-Up Instructions: No follow-ups on file.   Orders:  Orders Placed This Encounter  Procedures   XR Knee 1-2 Views Right   XR KNEE 3 VIEW LEFT   No orders of the defined types were placed in this encounter.     Procedures: No procedures performed   Clinical Data: No additional findings.  Objective: Vital Signs: There were no vitals taken for this visit.  Physical Exam:  Constitutional: Patient appears well-developed HEENT:  Head: Normocephalic Eyes:EOM are normal Neck: Normal range of motion Cardiovascular: Normal rate Pulmonary/chest:  Effort normal Neurologic: Patient is alert Skin: Skin is warm Psychiatric: Patient has normal mood and affect  Ortho Exam: Ortho exam demonstrates excellent range of motion both knees from near full extension to about 110 of flexion.  Collaterals are stable to varus valgus stress at 0 30 and 90 degrees.  Patellas track well bilaterally.  No groin pain with internal/external Tatian of the leg.  I do not palpate a Baker's cyst in either knee and there is not a difference in calf circumference just below the level of the joint.  Negative Homans.  Specialty Comments:  No specialty comments available.  Imaging: XR KNEE 3 VIEW LEFT Result Date: 09/22/2023 AP lateral merchant radiographs leftt knee reviewed.  Total knee prosthesis in good position alignment with no complicating features.  No lucencies at the bone cement interface.  No acute fracture.  Patella well centered in the trochlear groove of the femoral implant  XR Knee 1-2 Views Right Result Date: 09/22/2023 AP lateral merchant radiographs right knee reviewed.  Total knee prosthesis in good position alignment with no complicating features.  No lucencies at the bone cement interface.  No acute fracture.  Patella well centered in the trochlear groove of the femoral implant    PMFS History: Patient Active Problem List   Diagnosis Date Noted  Pain in right knee 01/27/2022   Cervical pain (neck) 12/29/2021   Pain in right hand 01/01/2021   Adhesive capsulitis of right shoulder 03/13/2020   Impingement syndrome of right shoulder 01/24/2020   S/P total knee arthroplasty, right 01/08/2018   Primary localized osteoarthritis of right knee 01/03/2018   Spinal stenosis of lumbar region 05/30/2017   Postoperative anemia due to acute blood loss 10/11/2015   Arthritis 10/11/2015   Constipation 10/11/2015   Headache 10/11/2015   Osteoarthritis of left knee 10/07/2015   Obesity 10/07/2015   S/P total knee replacement using cement 10/07/2015    Past Medical History:  Diagnosis Date   Arthritis    Depression    denies   Headache    migraines    Family History  Problem Relation Age of Onset   Parkinson's disease Mother    COPD Mother    Diabetes Sister     Past Surgical History:  Procedure Laterality Date   ABDOMINAL HYSTERECTOMY     ANKLE FUSION     BOWEL RESECTION     KNEE ARTHROSCOPY     KNEE SURGERY Bilateral    LEFT ARTHROCOPY  X3    ,  RT X1   TONSILLECTOMY     TOTAL KNEE ARTHROPLASTY Left 10/07/2015   TOTAL KNEE ARTHROPLASTY Left 10/07/2015   Procedure: TOTAL KNEE ARTHROPLASTY;  Surgeon: Valeria Batman, MD;  Location: [facility] OR;  Service: Orthopedics;  Laterality: Left;   TOTAL KNEE ARTHROPLASTY Right 01/03/2018   TOTAL KNEE ARTHROPLASTY Right 01/03/2018   Procedure: RIGHT TOTAL KNEE ARTHROPLASTY;  Surgeon: Valeria Batman, MD;  Location: [facility] OR;  Service: Orthopedics;  Laterality: Right;   Social History   Occupational History   Not on file  Tobacco Use   Smoking status: Never   Smokeless tobacco: Never  Vaping Use   Vaping status: Never Used  Substance and Sexual Activity   Alcohol use: Yes    Alcohol/week: 1.0 standard drink of alcohol    Types: 1 Glasses of wine per week    Comment: ONCE A week   Drug use: No   Sexual activity: Not Currently    Birth control/protection: Abstinence

## 2023-12-07 ENCOUNTER — Ambulatory Visit: Admitting: Orthopedic Surgery

## 2023-12-07 ENCOUNTER — Other Ambulatory Visit: Payer: Self-pay

## 2023-12-07 ENCOUNTER — Encounter: Payer: Self-pay | Admitting: Orthopedic Surgery

## 2023-12-07 DIAGNOSIS — M7062 Trochanteric bursitis, left hip: Secondary | ICD-10-CM

## 2023-12-07 DIAGNOSIS — M25552 Pain in left hip: Secondary | ICD-10-CM

## 2023-12-07 NOTE — Progress Notes (Signed)
 Office Visit Note   Patient: Lauren Frye           Date of Birth: April 30, 1951           MRN: [id number] Visit Date: 12/07/2023 Requested by: Hampton Levins, PA-C [address] [place],  [place] [address] PCP: Hampton Levins, PA-C  Subjective: Chief Complaint  Patient presents with   Left Hip - Pain    HPI: Lauren Frye is a 73 y.o. female who presents to the office reporting left hip pain.  Been going on about 6 months.  Reports occasional groin pain and thigh pain.  Mostly painful on the lateral aspect of that hip.  Ibuprofen  helps. Denies any low back pain and no numbness and tingling.  Hard for her to sleep on that left-hand side.  Does take ibuprofen  as needed..                ROS: All systems reviewed are negative as they relate to the chief complaint within the history of present illness.  Patient denies fevers or chills.  Assessment & Plan: Visit Diagnoses:  1. Pain in left hip     Plan: Impression is trochanteric bursitis.  Very tender to palpation over that left hip trochanteric region.  Radiographs do not show much in terms of arthritis.  Diagnostic and therapeutic ultrasound-guided injection performed into the trochanteric bursa today.  Will see how that works.  Could consider further imaging if symptoms do not improve.  Follow-Up Instructions: No follow-ups on file.   Orders:  Orders Placed This Encounter  Procedures   XR HIP UNILAT W OR W/O PELVIS 1V LEFT   No orders of the defined types were placed in this encounter.     Procedures: Large Joint Inj: L greater trochanter on 12/07/2023 1:20 PM Indications: pain and diagnostic evaluation Details: 22 G 3.5 in needle, ultrasound-guided lateral approach  Arthrogram: No  Medications: 5 mL lidocaine  1 %; 4 mL bupivacaine  0.25 % Outcome: tolerated well, no immediate complications Procedure, treatment alternatives, risks and benefits explained, specific risks discussed. Consent was given  by the patient. Immediately prior to procedure a time out was called to verify the correct patient, procedure, equipment, support staff and site/side marked as required. Patient was prepped and draped in the usual sterile fashion.    Kenalog injected   Clinical Data: No additional findings.  Objective: Vital Signs: There were no vitals taken for this visit.  Physical Exam:  Constitutional: Patient appears well-developed HEENT:  Head: Normocephalic Eyes:EOM are normal Neck: Normal range of motion Cardiovascular: Normal rate Pulmonary/chest: Effort normal Neurologic: Patient is alert Skin: Skin is warm Psychiatric: Patient has normal mood and affect  Ortho Exam: Ortho exam demonstrates normal gait alignment.  No Trendelenburg gait.  Pedal pulses palpable.  Ankle dorsiflexion plantarflexion quad hamstring strength is intact.  Does have discrete tenderness over the left greater trochanter compared to the right.  Hip abduction adduction and flexion strength is intact.  No groin pain with internal or external rotation of either hip.  Specialty Comments:  No specialty comments available.  Imaging: No results found.   PMFS History: Patient Active Problem List   Diagnosis Date Noted   Pain in right knee 01/27/2022   Cervical pain (neck) 12/29/2021   Pain in right hand 01/01/2021   Adhesive capsulitis of right shoulder 03/13/2020   Impingement syndrome of right shoulder 01/24/2020   S/P total knee arthroplasty, right 01/08/2018   Primary localized osteoarthritis of  right knee 01/03/2018   Spinal stenosis of lumbar region 05/30/2017   Postoperative anemia due to acute blood loss 10/11/2015   Arthritis 10/11/2015   Constipation 10/11/2015   Headache 10/11/2015   Osteoarthritis of left knee 10/07/2015   Obesity 10/07/2015   S/P total knee replacement using cement 10/07/2015   Past Medical History:  Diagnosis Date   Arthritis    Depression    denies   Headache    migraines     Family History  Problem Relation Age of Onset   Parkinson's disease Mother    COPD Mother    Diabetes Sister     Past Surgical History:  Procedure Laterality Date   ABDOMINAL HYSTERECTOMY     ANKLE FUSION     BOWEL RESECTION     KNEE ARTHROSCOPY     KNEE SURGERY Bilateral    LEFT ARTHROCOPY  X3    ,  RT X1   TONSILLECTOMY     TOTAL KNEE ARTHROPLASTY Left 10/07/2015   TOTAL KNEE ARTHROPLASTY Left 10/07/2015   Procedure: TOTAL KNEE ARTHROPLASTY;  Surgeon: Shirlee Dotter, MD;  Location: [facility];  Service: Orthopedics;  Laterality: Left;   TOTAL KNEE ARTHROPLASTY Right 01/03/2018   TOTAL KNEE ARTHROPLASTY Right 01/03/2018   Procedure: RIGHT TOTAL KNEE ARTHROPLASTY;  Surgeon: Shirlee Dotter, MD;  Location: [facility];  Service: Orthopedics;  Laterality: Right;   Social History   Occupational History   Not on file  Tobacco Use   Smoking status: Never   Smokeless tobacco: Never  Vaping Use   Vaping status: Never Used  Substance and Sexual Activity   Alcohol use: Yes    Alcohol/week: 1.0 standard drink of alcohol    Types: 1 Glasses of wine per week    Comment: ONCE A week   Drug use: No   Sexual activity: Not Currently    Birth control/protection: Abstinence

## 2023-12-10 MED ORDER — BUPIVACAINE HCL 0.25 % IJ SOLN
4.0000 mL | INTRAMUSCULAR | Status: AC | PRN
  Administered 2023-12-07: 4 mL via INTRA_ARTICULAR

## 2023-12-10 MED ORDER — LIDOCAINE HCL 1 % IJ SOLN
5.0000 mL | INTRAMUSCULAR | Status: AC | PRN
  Administered 2023-12-07: 5 mL

## 2024-01-10 ENCOUNTER — Telehealth: Payer: Self-pay | Admitting: Orthopedic Surgery

## 2024-01-10 ENCOUNTER — Other Ambulatory Visit (HOSPITAL_BASED_OUTPATIENT_CLINIC_OR_DEPARTMENT_OTHER): Payer: Self-pay

## 2024-01-10 MED ORDER — IBUPROFEN 800 MG PO TABS: 800.0000 mg | ORAL_TABLET | Freq: Three times a day (TID) | ORAL | 0 refills | Status: DC | PRN

## 2024-01-10 NOTE — Telephone Encounter (Signed)
 Sent in

## 2024-01-10 NOTE — Telephone Encounter (Signed)
 Pt called stating Dr Addie would send in refill of 800 mg ibuprofen  when she ran out. Please send to [facility]. Pt phone number is [phone].

## 2024-01-10 NOTE — Telephone Encounter (Signed)
 Yes  thx

## 2024-05-07 ENCOUNTER — Ambulatory Visit: Admitting: Podiatry

## 2024-05-07 DIAGNOSIS — M7989 Other specified soft tissue disorders: Secondary | ICD-10-CM

## 2024-05-07 DIAGNOSIS — M722 Plantar fascial fibromatosis: Secondary | ICD-10-CM | POA: Diagnosis not present

## 2024-05-07 MED ORDER — IBUPROFEN 800 MG PO TABS: 800.0000 mg | ORAL_TABLET | Freq: Three times a day (TID) | ORAL | 0 refills | Status: DC | PRN

## 2024-05-07 NOTE — Progress Notes (Addendum)
 Subjective:   Patient ID: Lauren Frye, female   DOB: 73 y.o.   MRN: [id number]   HPI Chief Complaint  Patient presents with   Foot Pain    Pt stated that the place on the bottom of her right foot is coming back on the bottom of her foot    73 year old female presents the office with above concerns.  She states that the spot on the bottom of her right foot has started coming back and is causing her quite a bit of pain particularly when she is walking.  She does describe some localized nerve pain as well but it does not radiate up the leg.  She feels that the area is tight and it pulls.  She does not recall any injuries or any recent treatment otherwise.   Review of Systems  All other systems reviewed and are negative.  Past Medical History:  Diagnosis Date   Arthritis    Depression    denies   Headache    migraines    Past Surgical History:  Procedure Laterality Date   ABDOMINAL HYSTERECTOMY     ANKLE FUSION     BOWEL RESECTION     KNEE ARTHROSCOPY     KNEE SURGERY Bilateral    LEFT ARTHROCOPY  X3    ,  RT X1   TONSILLECTOMY     TOTAL KNEE ARTHROPLASTY Left 10/07/2015   TOTAL KNEE ARTHROPLASTY Left 10/07/2015   Procedure: TOTAL KNEE ARTHROPLASTY;  Surgeon: Maude LELON Right, MD;  Location: [facility];  Service: Orthopedics;  Laterality: Left;   TOTAL KNEE ARTHROPLASTY Right 01/03/2018   TOTAL KNEE ARTHROPLASTY Right 01/03/2018   Procedure: RIGHT TOTAL KNEE ARTHROPLASTY;  Surgeon: Right Maude LELON, MD;  Location: [facility] OR;  Service: Orthopedics;  Laterality: Right;     Current Outpatient Medications:    B Complex-C-Folic Acid (STRESS FORMULA) TABS, Take by mouth., Disp: , Rfl:    clindamycin  (CLEOCIN ) 300 MG capsule, Take 1 capsule (300 mg total) by mouth 3 (three) times daily., Disp: 9 capsule, Rfl: 0   diclofenac  (VOLTAREN) 0.1 % ophthalmic solution, 4 (four) times daily. (Patient not taking: Reported on 12/09/2021), Disp: , Rfl:    HYDROcodone -acetaminophen  (NORCO/VICODIN) 5-325  MG tablet, Take 1-2 tablets by mouth every 4 (four) hours as needed for moderate pain. (Patient not taking: Reported on 12/09/2021), Disp: 30 tablet, Rfl: 0   ibuprofen  (ADVIL ) 800 MG tablet, Take 1 tablet (800 mg total) by mouth every 8 (eight) hours as needed., Disp: 30 tablet, Rfl: 0   ibuprofen  (ADVIL ,MOTRIN ) 800 MG tablet, Take 1 tablet (800 mg total) by mouth every 8 (eight) hours as needed. (Patient not taking: Reported on 12/09/2021), Disp: 30 tablet, Rfl: 0   meloxicam  (MOBIC ) 15 MG tablet, Take 1 tablet (15 mg total) by mouth daily. (Patient not taking: Reported on 12/09/2021), Disp: 30 tablet, Rfl: 1   methocarbamol  (ROBAXIN ) 500 MG tablet, Take 1 tablet (500 mg total) by mouth every 6 (six) hours as needed for muscle spasms. (Patient not taking: Reported on 06/01/2022), Disp: 30 tablet, Rfl: 0   Multiple Vitamins-Minerals (STRESS TAB NF PO), Take 1 tablet by mouth daily., Disp: , Rfl:    oxyCODONE -acetaminophen  (PERCOCET/ROXICET) 5-325 MG tablet, Take 1-2 tablets by mouth every 6 (six) hours as needed for severe pain., Disp: 20 tablet, Rfl: 0   Probiotic Product (PROBIOTIC ADVANCED PO), Take 3 capsules by mouth daily., Disp: , Rfl:    promethazine  (PHENERGAN ) 25 MG tablet, Take 1 tablet (  25 mg total) by mouth every 8 (eight) hours as needed for nausea or vomiting., Disp: 20 tablet, Rfl: 0  Allergies  Allergen Reactions   Codeine Anaphylaxis, Hives, Itching and Swelling    Possible swelling in throat   Penicillins Anaphylaxis    Has patient had a PCN reaction causing immediate rash, facial/tongue/throat swelling, SOB or lightheadedness with hypotension: Unknown Has patient had a PCN reaction causing severe rash involving mucus membranes or skin necrosis: Unknown Has patient had a PCN reaction that required hospitalization: Unknown--treated in ER Has patient had a PCN reaction occurring within the last 10 years: No CHILDHOOD REACTION. If all of the above answers are NO, then may proceed  with Cephalosporin use.    Propoxyphene Hives, Itching and Swelling    POSSIBLE swelling in throat   Prednisone Swelling    Undefined as to specifics          Objective:  Physical Exam  General: AAO x3, NAD  Dermatological: Scar from prior surgery is well-healed.  There is no open lesions.  Vascular: Dorsalis Pedis artery and Posterior Tibial artery pedal pulses are 2/4 bilateral with immedate capillary fill time. There is no pain with calf compression, swelling, warmth, erythema.   Neruologic: Grossly intact via light touch bilateral.   Musculoskeletal: Just around the area of the scar is starting to be a palpable nodule noted.  Appears to be either scar tissue or reoccurrence of the plantar fibroma starting to come back.  Not as hard as what it was previously.  There is no fluctuation or crepitation.  No drainable collection noted.       Assessment:   Concern for recurrent plantar fibroma     Plan:  -Treatment options discussed including all alternatives, risks, and complications -Etiology of symptoms were discussed - At this point recommended MRI to further evaluate the soft tissue mass.  This was ordered today. -Offered steroid injection.  She not able to tolerate oral steroids.  Voltaren gel has not been helpful.  Refilled ibuprofen  800 mg to take as needed. -Discussed stretching, icing on a regular basis.  - Discussed inserts to help offload. She is to be seen by Lolita, pedorthotist  - PMR to discuss other treatment options including possibly shockwave treatment or surgical intervention.  Return for MRI results.  Lauren Frye DPM

## 2024-05-07 NOTE — Patient Instructions (Signed)

## 2024-05-08 ENCOUNTER — Telehealth: Payer: Self-pay | Admitting: Podiatry

## 2024-05-08 NOTE — Telephone Encounter (Signed)
 Her MRI is scheduled for 05/18/24. She called in and scheduled MRI follow up on 06/07/24. I mentioned to her that it might take more time than that for the MRI to be read. In general,what is the turn around time for MRI results?

## 2024-05-08 NOTE — Telephone Encounter (Signed)
 Her MRI is scheduled for 05/18/24. She called in and scheduled MRI follow up on 06/07/24. I mentioned to her

## 2024-05-29 ENCOUNTER — Ambulatory Visit
Admission: RE | Admit: 2024-05-29 | Discharge: 2024-05-29 | Disposition: A | Discharge status: Home / Self Care | Source: Ambulatory Visit | Attending: Podiatry | Admitting: Podiatry

## 2024-05-29 DIAGNOSIS — M7989 Other specified soft tissue disorders: Secondary | ICD-10-CM

## 2024-05-29 MED ORDER — GADOPICLENOL 0.5 MMOL/ML IV SOLN
10.0000 mL | Freq: Once | INTRAVENOUS | Status: AC | PRN
  Administered 2024-05-29: 10 mL via INTRAVENOUS

## 2024-06-06 ENCOUNTER — Ambulatory Visit: Payer: Self-pay | Admitting: Podiatry

## 2024-06-07 ENCOUNTER — Encounter: Payer: Self-pay | Admitting: Podiatry

## 2024-06-07 ENCOUNTER — Ambulatory Visit: Admitting: Podiatry

## 2024-06-07 VITALS — Ht 66.0 in | Wt 225.0 lb

## 2024-06-07 DIAGNOSIS — M722 Plantar fascial fibromatosis: Secondary | ICD-10-CM | POA: Diagnosis not present

## 2024-06-07 NOTE — Patient Instructions (Signed)
  For the toenails you can try NUVAIL [email or web address]  --  Pre-Operative Instructions  Congratulations, you have decided to take an important step to improving your quality of life.  You can be assured that the doctors of [facility] will be with you every step of the way.  Plan to be at the surgery center/hospital at least 1 (one) hour prior to your scheduled time unless otherwise directed by the surgical center/hospital staff.  You must have a responsible adult accompany you, remain during the surgery and drive you home.  Make sure you have directions to the surgical center/hospital and know how to get there on time. For hospital based surgery you will need to obtain a history and physical form from your family physician within 1 month prior to the date of surgery- we will give you a form for you primary physician.  We make every effort to accommodate the date you request for surgery.  There are however, times where surgery dates or times have to be moved.  We will contact you as soon as possible if a change in schedule is required.   No Aspirin /Ibuprofen  for one week before surgery.  If you are on aspirin , any non-steroidal anti-inflammatory medications (Mobic , Aleve, Ibuprofen ) you should stop taking it 7 days prior to your surgery.  You make take Tylenol   For pain prior to surgery.  Medications- If you are taking daily heart and blood pressure medications, seizure, reflux, allergy, asthma, anxiety, pain or diabetes medications, make sure the surgery center/hospital is aware before the day of surgery so they may notify you which medications to take or avoid the day of surgery. No food or drink after midnight the night before surgery unless directed otherwise by surgical center/hospital staff. No alcoholic beverages 24 hours prior to surgery.  No smoking 24 hours prior to or 24 hours after surgery. Wear loose pants or shorts- loose enough to fit over bandages, boots, and casts. No  slip on shoes, sneakers are best. Bring your boot with you to the surgery center/hospital.  Also bring crutches or a walker if your physician has prescribed it for you.  If you do not have this equipment, it will be provided for you after surgery. If you have not been contracted by the surgery center/hospital by the day before your surgery, call to confirm the date and time of your surgery. Leave-time from work may vary depending on the type of surgery you have.  Appropriate arrangements should be made prior to surgery with your employer. Prescriptions will be provided immediately following surgery by your doctor.  Have these filled as soon as possible after surgery and take the medication as directed. Remove nail polish on the operative foot. Wash the night before surgery.  The night before surgery wash the foot and leg well with the antibacterial soap provided and water  paying special attention to beneath the toenails and in between the toes.  Rinse thoroughly with water  and dry well with a towel.  Perform this wash unless told not to do so by your physician.  Enclosed: 1 Ice pack (please put in freezer the night before surgery)   1 Hibiclens  skin cleaner   Pre-op Instructions  If you have any questions regarding the instructions, do not hesitate to call our office at any point during this process.   [place]: [address] [place], [place] [address] [phone]  [place]: [address] [place], [place] [address] [phone]  Dr. Donnice Fees, DPM

## 2024-06-11 ENCOUNTER — Telehealth: Payer: Self-pay | Admitting: Podiatry

## 2024-06-11 NOTE — Progress Notes (Signed)
 Subjective:   Patient ID: Lauren Frye, female   DOB: 73 y.o.   MRN: [id number]   HPI Chief Complaint  Patient presents with   Foot Pain    Pt is here to f/u on right foot she states that its not better, here to discuss MRI results as well.    73 year old female presents the office with above concerns. She presents to discuss MRI results.  She states that she continues to have discomfort particular the bottom of the foot and she hurt to walk needing to drive at times.    Review of Systems  All other systems reviewed and are negative.      Objective:  Physical Exam  General: AAO x3, NAD  Dermatological: Scar from prior surgery is well-healed.  There is no open lesions.  Vascular: Dorsalis Pedis artery and Posterior Tibial artery pedal pulses are 2/4 bilateral with immedate capillary fill time. There is no pain with calf compression, swelling, warmth, erythema.   Neruologic: Grossly intact via light touch bilateral.   Musculoskeletal: Just around the area of the scar is starting to be a palpable nodule noted.  Soft tissue mass is noted along the area of the plantar fibroma starting to come back.  Not as hard as what it was previously.  There is no fluctuation or crepitation.  No drainable collection noted.       Assessment:   Recurrent plantar fibroma     Plan:  -Treatment options discussed including all alternatives, risks, and complications -Etiology of symptoms were discussed -Reviewed the MRI.  We discussed both conservative as well as surgical options remove the reoccurrence of the fibroma.  Discussed that even with revision surgery there is still a chance of recurrence in the future.  Discussed both options and she was proceed with surgical excision. -The incision placement as well as the postoperative course was discussed with the patient. I discussed risks of the surgery which include, but not limited to, infection, bleeding, pain, swelling, need for further  surgery, delayed or nonhealing, painful or ugly scar, numbness or sensation changes, over/under correction, recurrence, transfer lesions, further deformity, hardware failure, DVT/PE, loss of toe/foot. Patient understands these risks and wishes to proceed with surgery. The surgical consent was reviewed with the patient all 3 pages were signed. No promises or guarantees were given to the outcome of the procedure. All questions were answered to the best of my ability. Before the surgery the patient was encouraged to call the office if there is any further questions. The surgery will be performed at the [facility] on an outpatient basis.  No follow-ups on file.  Lauren Frye DPM

## 2024-06-11 NOTE — Telephone Encounter (Signed)
 Called and patient is scheduled for surgery on 06/20/2024. Patient is not on any GLP1 medications or blood thinners. Patient pharmacy is correct in chart.

## 2024-06-13 ENCOUNTER — Telehealth: Payer: Self-pay | Admitting: Podiatry

## 2024-06-13 NOTE — Telephone Encounter (Signed)
 DOS- 06/20/2024  PLANTAR FIBROMA RT- 71937  [employer] AD EFFECTIVE DATE- 07/06/2023  DEDUCTIBLE- N/A OOP- $3400 ACCUMULATED- $345 COINSURANCE- N/A REF# [id number]  PER BRIANNA WITH [employer], NO PRIOR AUTH IS REQUIRED FOR CPT CODE 71937. REF# [facility] 08/15/2023 2:24 EST

## 2024-06-20 ENCOUNTER — Other Ambulatory Visit: Payer: Self-pay | Admitting: Podiatry

## 2024-06-20 DIAGNOSIS — M722 Plantar fascial fibromatosis: Secondary | ICD-10-CM | POA: Diagnosis not present

## 2024-06-20 MED ORDER — CLINDAMYCIN HCL 300 MG PO CAPS: 300.0000 mg | ORAL_CAPSULE | Freq: Three times a day (TID) | ORAL | 0 refills | Status: DC

## 2024-06-20 MED ORDER — PROMETHAZINE HCL 25 MG PO TABS: 25.0000 mg | ORAL_TABLET | Freq: Three times a day (TID) | ORAL | 0 refills | Status: DC | PRN

## 2024-06-20 MED ORDER — OXYCODONE-ACETAMINOPHEN 5-325 MG PO TABS: 1.0000 | ORAL_TABLET | Freq: Four times a day (QID) | ORAL | 0 refills | Status: DC | PRN

## 2024-06-21 ENCOUNTER — Other Ambulatory Visit: Payer: Self-pay | Admitting: Podiatry

## 2024-06-21 ENCOUNTER — Telehealth: Payer: Self-pay | Admitting: Podiatry

## 2024-06-21 MED ORDER — GABAPENTIN 100 MG PO CAPS: 100.0000 mg | ORAL_CAPSULE | Freq: Three times a day (TID) | ORAL | 0 refills | Status: DC

## 2024-06-21 NOTE — Telephone Encounter (Signed)
 Patient stated oxycodone  isn't working very well, also would like to know if she could take 800 mg of ibuprofen  along with oxycodone , she is in pain. She would like to speak with a nurse. [phone]

## 2024-06-25 ENCOUNTER — Encounter

## 2024-06-25 DIAGNOSIS — M722 Plantar fascial fibromatosis: Secondary | ICD-10-CM

## 2024-06-25 NOTE — Progress Notes (Signed)
 Subjective: Chief Complaint  Patient presents with   Post-op Follow-up    POV #1 DOS 06/20/2024 RT PLANTAR FIBROMA. Non diabetic. 1 pain. Tingling sensation. Wearing pneumatic cast.     73 year old female presents for above concerns.  She states that her pain is subsided over the weekend and she is only taking minimal amount of ibuprofen  and 1 gabapentin .  She is no longer taking any narcotic medication.  Denies any fevers or chills.  No other concerns.  Objective: AAO x3, NAD DP/PT pulses palpable bilaterally, CRT less than 3 seconds Incision well coapted with sutures intact without any evidence of dehiscence.  There is no surrounding erythema, ascending cellulitis but there is no drainage or pus.  There is no fluctuation or crepitation present.  Minimal to mild edema present with no open lesions or signs of infection.  Mild tenderness palpation at surgical site. No pain with calf compression, swelling, warmth, erythema  Assessment: Status post plantar fibroma excision  Plan: -All treatment options discussed with the patient including all alternatives, risks, complications.  -Incisions healing well with any evidence dehiscence or infection.  Xeroform applied today followed by dressing.  She keep dressing clean, dry, intact.  If she needs to change his bandage she can with a similar bandage but do not soak the foot.  Continue nonweightbearing.  She can use the heel for transfers if needed. -Pain medication as needed -Monitor for any clinical signs or symptoms of infection and directed to call the office immediately should any occur or go to the ER. -Patient encouraged to call the office with any questions, concerns, change in symptoms.   Likely suture removal next appointment (at least half of them)  No follow-ups on file.  Lauren Frye DPM

## 2024-07-02 ENCOUNTER — Other Ambulatory Visit: Payer: Self-pay | Admitting: Podiatry

## 2024-07-02 ENCOUNTER — Encounter: Payer: Self-pay | Admitting: Lab

## 2024-07-02 ENCOUNTER — Telehealth: Payer: Self-pay | Admitting: Podiatry

## 2024-07-02 MED ORDER — IBUPROFEN 800 MG PO TABS: 800.0000 mg | ORAL_TABLET | Freq: Three times a day (TID) | ORAL | 0 refills | Status: DC | PRN

## 2024-07-02 NOTE — Telephone Encounter (Signed)
"  Patient has been made aware  "

## 2024-07-02 NOTE — Telephone Encounter (Signed)
 Pt prefers to take ibuprofen  (ADVIL ) 800 MG tablet instead of oxyCODONE -acetaminophen  (PERCOCET/ROXICET) 5-325 MG tablet. Sates the IBU does managed pain. Requesting a refill.

## 2024-07-03 NOTE — Telephone Encounter (Signed)
"  Thank you for the updates!  "

## 2024-07-03 NOTE — Telephone Encounter (Signed)
 Spoke to patient informed medication sent to corrected pharmacy.

## 2024-07-09 ENCOUNTER — Ambulatory Visit (INDEPENDENT_AMBULATORY_CARE_PROVIDER_SITE_OTHER): Admitting: Podiatry

## 2024-07-09 DIAGNOSIS — M722 Plantar fascial fibromatosis: Secondary | ICD-10-CM

## 2024-07-09 NOTE — Progress Notes (Unsigned)
 Patient presents for post-op visit today, POV #2 DOS 06/20/2024 RT PLANTAR FIBROMA  Doing okay. I mean it still lets me know that it is ya know, alive and kicking. I am not supposed to be weight bearing and I behave..  Notes: n/a  Vital Signs: Today's Vitals   07/09/24 1420  PainSc: 1   PainLoc: Foot      Radiographs: []  Taken [x]  Not taken  Surgical Site Assessment:  - Dressing:  [x]  Minimal dry blood, intact []  Reinforced   []  Changed     -Notes: n/a  - Incision:  [x]  CDI (clean, dry, intact)  [x]  Mild erythema  []  Drainage noted   -Notes: n/a  - Swelling:  []  None  [x]  Mild  []  Moderate   []  Significant     -Notes: n/a  - Bruising:  []  None  [x]  Present: along incision   - Sutures/Staples:  []  None [x]  Intact  []  Removed Today  []  Plan to remove at next visit   -Cast/Splint/Pins: [x]  None []  Intact []  Removed Today []  Plan to remove at next visit []  Replaced  -Signs of infection:  [x]  None  []  Present - Describe: n/a  -DME:    []  None [x]  AFW []  Surgical shoe []  Cast  []  Splint  -Walking status:  []  Full WB  []  Partial WB  [x]  NWB  -Utilizing device:  []  None []  Knee Scooter []  Crutches [x]  Rolator walker   DVT assessment:  [x]  Denies symptoms []  Chest pain/SOB []  Pain in calf/redness/warmth   Redressed DSD and ace wrap. Educated on signs of infection, proper dressing care, pain management, and weight bearing status. Patient will contact provider with any new or worsening symptoms. The provider assessed the patient today and reviewed instructions regarding plan of care.  -  Patient seen and evaluated myself.  Incisions healing well without any evidence of dehiscence and sutures intact.  No surrounding erythema, ascending cellulitis.  No drainage or pus.  There is no edema present.  Discomfort at surgical site upon palpation.  After sutures removed today there is to be removed next time.  Applied lidocaine  cream prior to removal.  Dressing reapplied.  Continue  nonweightbearing or weightbearing to the heel for limited time.  Plan for suture removal for the remainder of the sutures next appointment in 1 week.    Lauren Frye DPM

## 2024-07-16 ENCOUNTER — Ambulatory Visit (INDEPENDENT_AMBULATORY_CARE_PROVIDER_SITE_OTHER): Admitting: Podiatry

## 2024-07-16 DIAGNOSIS — M722 Plantar fascial fibromatosis: Secondary | ICD-10-CM

## 2024-07-16 DIAGNOSIS — M7989 Other specified soft tissue disorders: Secondary | ICD-10-CM

## 2024-07-16 NOTE — Progress Notes (Signed)
 Patient presents for post-op visit today, POV #3 DOS 06/20/2024 RT PLANTAR FIBROMA  Doing okay..  Notes: n/a  Vital Signs: Today's Vitals   07/16/24 1003  PainSc: 0-No pain      Radiographs: []  Taken [x]  Not taken  Surgical Site Assessment:  - Dressing:  [x]  Minimal dry blood, intact []  Reinforced   []  Changed     -Notes: n/a  - Incision:  [x]  CDI (clean, dry, intact)  [x]  Mild erythema  []  Drainage noted   -Notes: n/a  - Swelling:  []  None  [x]  Mild  []  Moderate   []  Significant     -Notes: n/a  - Bruising:  []  None  [x]  Present: plantar aspect of foot, small area right above the incision.    - Sutures/Staples:  []  None [x]  Intact  [x]  Removed Today  []  Plan to remove at next visit   -Cast/Splint/Pins: [x]  None []  Intact []  Removed Today []  Plan to remove at next visit []  Replaced  -Signs of infection:  [x]  None  []  Present - Describe: n/a  -DME:    []  None [x]  AFW []  Surgical shoe []  Cast  []  Splint  -Walking status:  []  Full WB  []  Partial WB  [x]  NWB  -Utilizing device:  []  None []  Knee Scooter []  Crutches [x]  Rolling Walker    DVT assessment:  [x]  Denies symptoms []  Chest pain/SOB []  Pain in calf/redness/warmth   Redressed DSD and ace wrap. Educated on signs of infection, proper dressing care, pain management, and weight bearing status. Patient will contact provider with any new or worsening symptoms. The provider assessed the patient today and reviewed instructions regarding plan of care.  -  74 year old presents the office today with above concerns.  She presents today to have the remainder of the sutures removed.  States that she has been doing well and no pain medication.  No fevers or chills or other concerns today.  Incisions healing well with sutures intact without any evidence of dehiscence.  There is no swelling, minimal bruising present.  No erythema, warmth or signs of infection.  Sutures without complications.  Discussed that later this week  she can start to transition to partial weightbearing in the cam boot.  She can wash the foot with soap and water , dry thoroughly and apply a similar bandage.  Continue ice, elevation. Monitor for any clinical signs or symptoms of infection and directed to call the office immediately should any occur or go to the ER.  Return in about 2 weeks (around 07/30/2024), or POV #4.  Lauren Frye Fees DPM

## 2024-07-23 ENCOUNTER — Encounter

## 2024-07-30 ENCOUNTER — Encounter: Admitting: Podiatry

## 2024-08-02 ENCOUNTER — Ambulatory Visit (INDEPENDENT_AMBULATORY_CARE_PROVIDER_SITE_OTHER): Admitting: Podiatry

## 2024-08-02 DIAGNOSIS — M722 Plantar fascial fibromatosis: Secondary | ICD-10-CM

## 2024-08-02 MED ORDER — IBUPROFEN 800 MG PO TABS: 800.0000 mg | ORAL_TABLET | Freq: Three times a day (TID) | ORAL | 0 refills | Status: AC | PRN

## 2024-08-02 MED ORDER — GABAPENTIN 100 MG PO CAPS: 100.0000 mg | ORAL_CAPSULE | Freq: Three times a day (TID) | ORAL | 0 refills | Status: AC

## 2024-08-04 NOTE — Progress Notes (Signed)
 Subjective: Chief Complaint  Patient presents with   Post-op Follow-up    RT foot. There is a small area on the incision that the scab came off. She needs refills on gabapentin  and 800 mg ibuprofen .    74 year old female presents the [place] above concerns.  She states she been walking the cam boot and doing well but she is not smoking back into a regular boot style shoe.  She states 1 part of the incision scab came off.  She has been keeping it bandaged.  Does not report any fevers or chills.  Objective: AAO x3, NAD DP/PT pulses palpable bilaterally, CRT less than 3 seconds On the incision plantarly scar is formed.  In the distal portion of the incision small Mehta scabbing, dry skin present which I debrided today.  Superficial area skin breakdown.  No surrounding erythema, probing, undermining or tunneling.  No fluctuation or crepitation.  No malodor. No pain with calf compression, swelling, warmth, erythema     Assessment: Status post right foot plantar fibroma excision  Plan: -All treatment options discussed with the patient including all alternatives, risks, complications.  -Debrided the tissue distal to that any complications.  Is superficial.  Discussed antibiotic ointment dressing changes daily, offloading.  Once this heals she can start to gradually transition to a shoe as tolerated.  Discussed scar creams.  Compression. -Long-term we discussed that if the fibroma, mass were to recur possible radiation treatment. -Patient encouraged to call the office with any questions, concerns, change in symptoms.   Return in about 4 weeks (around 08/30/2024), or if symptoms worsen or fail to improve.  Donnice JONELLE Fees DPM

## 2024-09-04 ENCOUNTER — Encounter: Admitting: Podiatry
